# Patient Record
Sex: Male | Born: 1970 | Race: Black or African American | Hispanic: No
Health system: Midwestern US, Community
[De-identification: ages and names within clinical notes are randomized; demographics above are authoritative.]

## PROBLEM LIST (undated history)

## (undated) DIAGNOSIS — D34 Benign neoplasm of thyroid gland: Secondary | ICD-10-CM

## (undated) DIAGNOSIS — E119 Type 2 diabetes mellitus without complications: Principal | ICD-10-CM

## (undated) DIAGNOSIS — M25561 Pain in right knee: Secondary | ICD-10-CM

## (undated) DIAGNOSIS — Z01818 Encounter for other preprocedural examination: Secondary | ICD-10-CM

## (undated) DIAGNOSIS — Z01812 Encounter for preprocedural laboratory examination: Secondary | ICD-10-CM

## (undated) DIAGNOSIS — I480 Paroxysmal atrial fibrillation: Principal | ICD-10-CM

## (undated) DIAGNOSIS — G4733 Obstructive sleep apnea (adult) (pediatric): Secondary | ICD-10-CM

## (undated) DIAGNOSIS — M25512 Pain in left shoulder: Secondary | ICD-10-CM

## (undated) DIAGNOSIS — Z96652 Presence of left artificial knee joint: Secondary | ICD-10-CM

## (undated) DIAGNOSIS — E041 Nontoxic single thyroid nodule: Secondary | ICD-10-CM

## (undated) DIAGNOSIS — M1712 Unilateral primary osteoarthritis, left knee: Secondary | ICD-10-CM

## (undated) DIAGNOSIS — Z9189 Other specified personal risk factors, not elsewhere classified: Secondary | ICD-10-CM

## (undated) DIAGNOSIS — Z202 Contact with and (suspected) exposure to infections with a predominantly sexual mode of transmission: Secondary | ICD-10-CM

## (undated) DIAGNOSIS — G8929 Other chronic pain: Secondary | ICD-10-CM

## (undated) DIAGNOSIS — G4730 Sleep apnea, unspecified: Secondary | ICD-10-CM

## (undated) DIAGNOSIS — D171 Benign lipomatous neoplasm of skin and subcutaneous tissue of trunk: Secondary | ICD-10-CM

## (undated) DIAGNOSIS — M25562 Pain in left knee: Secondary | ICD-10-CM

## (undated) DIAGNOSIS — R051 Acute cough: Secondary | ICD-10-CM

## (undated) MED FILL — ELIQUIS 5MG TABS: 5 MG | 30 days supply | Qty: 60 | Fill #0 | Status: AC

## (undated) MED FILL — OXYCODONE-ACETAMINOPHEN 5-325 TABS: 5-325 MG | 3 days supply | Qty: 28 | Fill #0 | Status: AC

## (undated) MED FILL — LOSARTAN POTASSIUM 50MG TABS: 50 MG | 30 days supply | Qty: 30 | Fill #0 | Status: AC

## (undated) MED FILL — ATORVASTATIN CALCIUM 40MG TABS: 40 MG | 30 days supply | Qty: 30 | Fill #0 | Status: AC

## (undated) MED FILL — METOPROLOL SUCCINATE ER 50MG TB24: 50 MG | 30 days supply | Qty: 60 | Fill #0 | Status: AC

## (undated) MED FILL — METFORMIN HCL 1000MG TABS: 1000 MG | 30 days supply | Qty: 60 | Fill #0 | Status: AC

---

## 2013-09-15 ENCOUNTER — Emergency Department: Payer: Self-pay | Admitting: Emergency Medicine

## 2014-10-20 IMAGING — CR RIGHT HAND - COMPLETE 3+ VIEW
1 series · 3 of 3 positions shown · non-contrast
Comparison: none

REASON FOR EXAM: dog bite
COMMENTS:   LMP: (Male)

[Series 1: pa · 0.17mm/px · 3 of 3 slices shown]
[im 1/3]
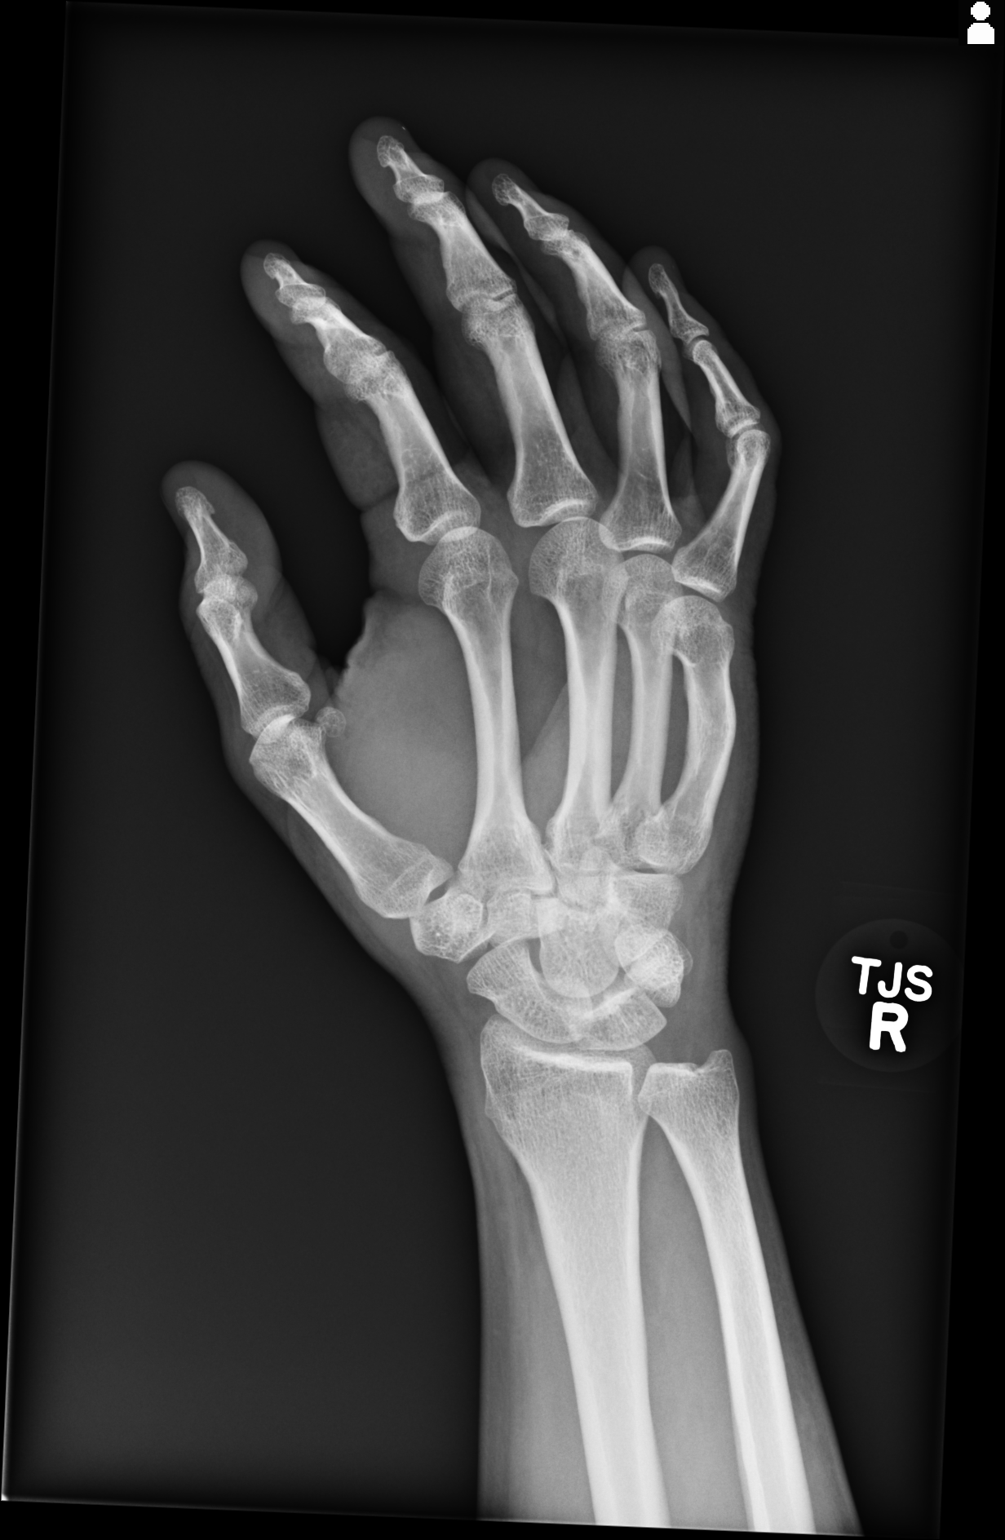
[im 2/3]
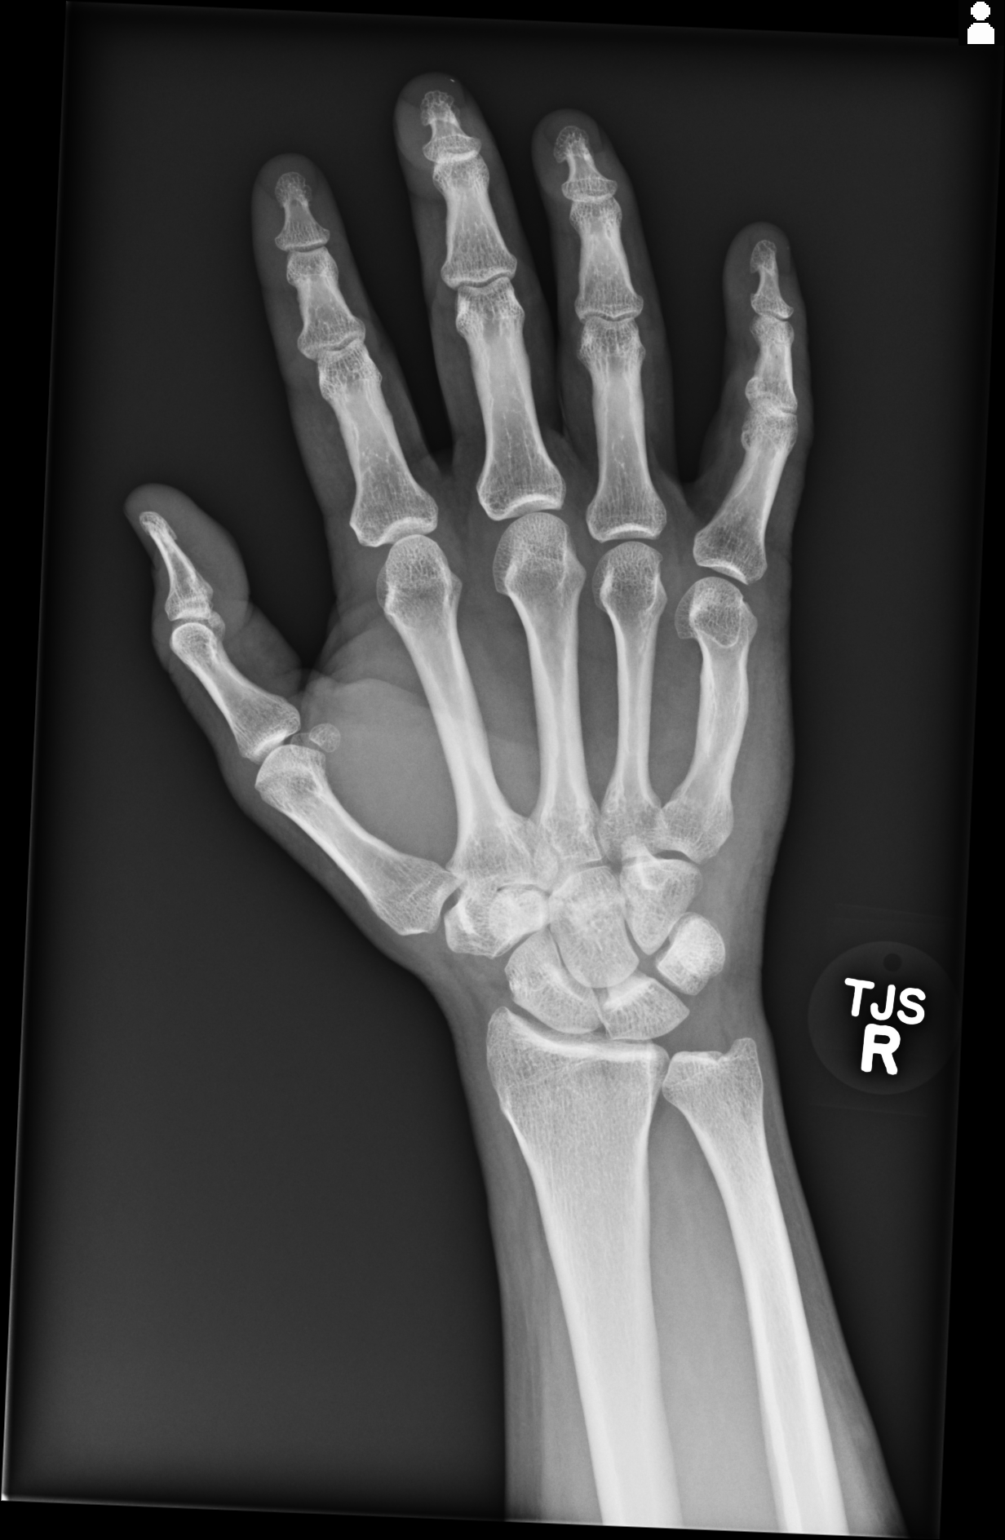
[im 3/3]
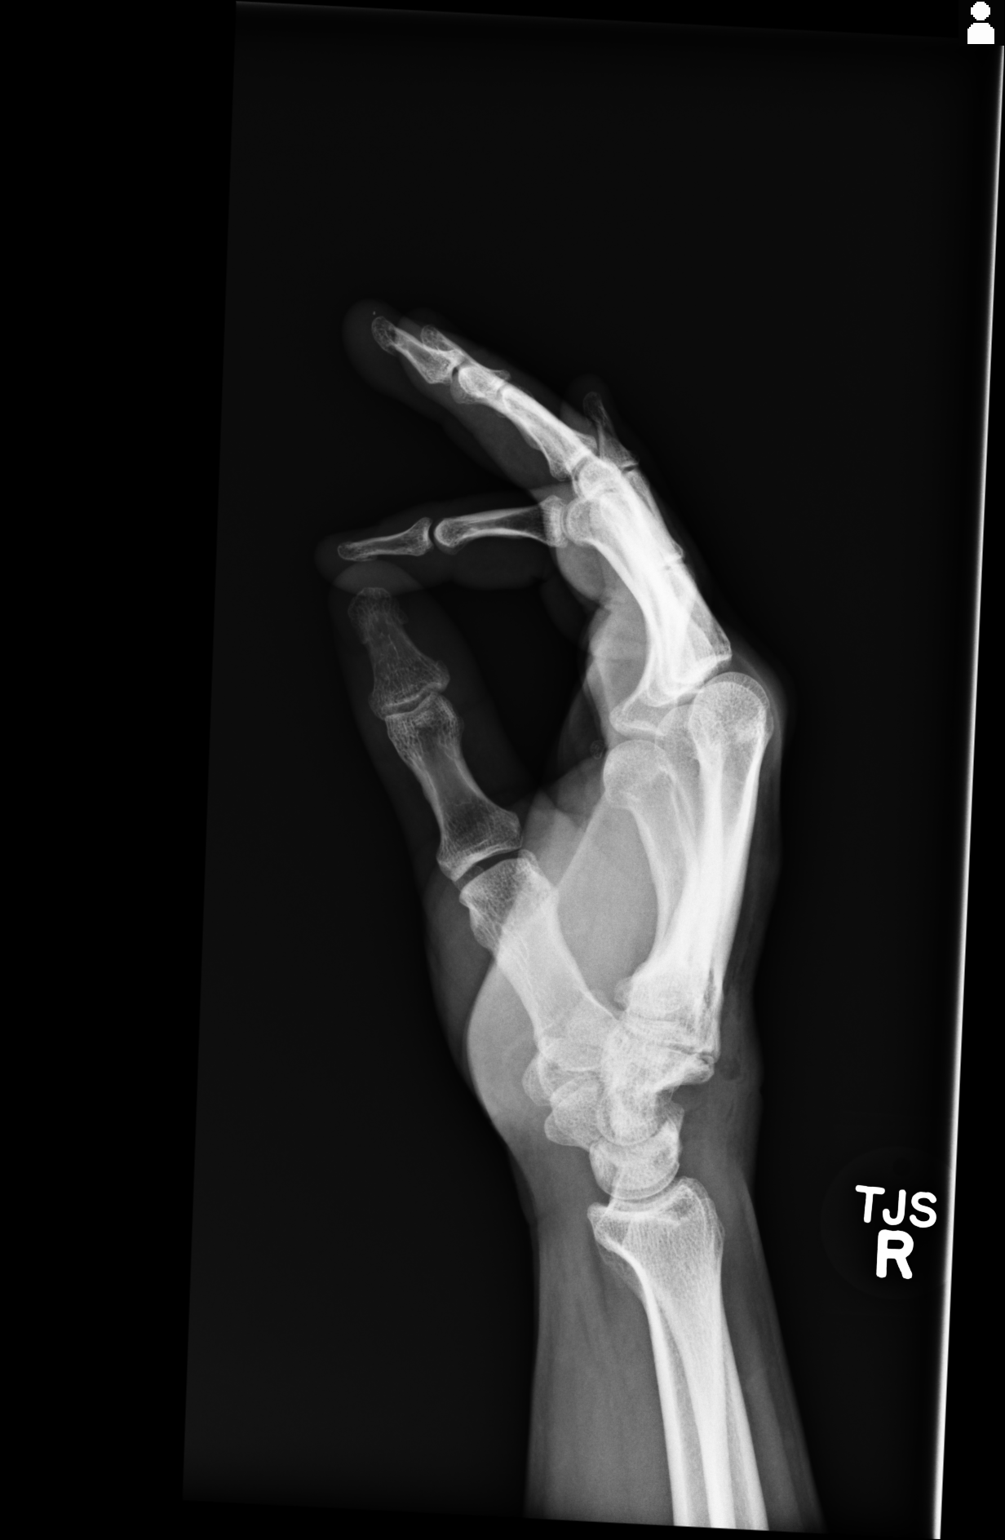

[3 of 3 positions shown; findings below may reference images not displayed]

PROCEDURE:     DXR - DXR HAND RT COMPLETE W/OBLIQUES  - September 15, 2013 [DATE]

RESULT:     Three views of the right hand are reviewed. The bones appear
adequately mineralized. An old fracture of the midshaft of the fifth
metacarpal is present. The interphalangeal joints and the
metacarpophalangeal joints appear normal. The overlying soft tissues exhibit
no foreign bodies or gas collections.
IMPRESSION: There is no acute bony abnormality of the right hand nor
definite soft tissue abnormality.

[REDACTED]

## 2016-05-22 ENCOUNTER — Encounter: Payer: Self-pay | Admitting: Family Medicine

## 2016-05-22 ENCOUNTER — Encounter (INDEPENDENT_AMBULATORY_CARE_PROVIDER_SITE_OTHER): Payer: Self-pay

## 2016-05-22 ENCOUNTER — Ambulatory Visit (INDEPENDENT_AMBULATORY_CARE_PROVIDER_SITE_OTHER): Payer: Self-pay | Admitting: Family Medicine

## 2016-05-22 VITALS — BP 126/80 | HR 58 | Temp 98.2°F | Resp 14 | Ht 74.0 in | Wt 224.0 lb

## 2016-05-22 DIAGNOSIS — Z7189 Other specified counseling: Secondary | ICD-10-CM

## 2016-05-22 DIAGNOSIS — Z7689 Persons encountering health services in other specified circumstances: Secondary | ICD-10-CM

## 2016-05-22 DIAGNOSIS — Z13220 Encounter for screening for lipoid disorders: Secondary | ICD-10-CM

## 2016-05-22 DIAGNOSIS — G629 Polyneuropathy, unspecified: Secondary | ICD-10-CM

## 2016-05-22 NOTE — Progress Notes (Signed)
Subjective:    Patient ID: Aaron Williams, male    DOB: July 26, 1971, 45 y.o.   MRN: [id number]  HPI Patient is a 45 year old white male here today to establish care. He is my Education officer, environmental.  He is a married father of 2. He engages in regular aerobic exercise. In fact he recently trained and ran a 10K. However over the last 2-3 months, he is developed numbness in his feet and hands. The numbness on examination seems to be distal to the MTP joints on all of his toes but is more pronounced on his big toe. This is symmetric bilaterally. He has decreased temperature sensation, decreased 5. discrimination, and decreased vibratory sensation on his toes but is not present on the dorsums of his feet or on his shins. He also reports "itching" in both hands. He states that it is deep. It is not actually an itch but we're pins and needles sensation. He finds himself scratching his hands for no reason even though there is no rash. 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 He denies any tremors. He denies any hand weakness or muscle weakness. He denies any other neurologic deficits, blurry vision, double vision. No past medical history on file. No past surgical history on file. No current outpatient prescriptions on file prior to visit.   No current facility-administered medications on file prior to visit.   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 Denies any alcohol tobacco or illicit drugs. Family History  Problem Relation Age of Onset  . Diabetes Mother   . Heart disease Father   . Parkinson's disease Maternal Grandmother   . Heart disease Paternal Grandfather       Review of Systems  All other systems reviewed and are negative.      Objective:   Physical Exam  Constitutional: He is oriented to person, place, and time. He appears well-developed and well-nourished. No distress.  HENT:  Head: Normocephalic and atraumatic.    Right Ear: External ear normal.  Left Ear: External ear normal.  Nose: Nose normal.  Mouth/Throat: Oropharynx is clear and moist. No oropharyngeal exudate.  Eyes: Conjunctivae and EOM are normal. Pupils are equal, round, and reactive to light. Right eye exhibits no discharge. Left eye exhibits no discharge. No scleral icterus.  Neck: Normal range of motion. Neck supple. No JVD present. No tracheal deviation present. No thyromegaly present.  Cardiovascular: Normal rate, regular rhythm, normal heart sounds and intact distal pulses.  Exam reveals no gallop and no friction rub.   No murmur heard. Pulmonary/Chest: Effort normal and breath sounds normal. No stridor. No respiratory distress. He has no wheezes. He has no rales.  Abdominal: Soft. Bowel sounds are normal. He exhibits no distension. There is no tenderness. There is no rebound and no guarding.  Musculoskeletal: He exhibits no edema or tenderness.       Right foot: There is normal range of motion, no tenderness, no bony tenderness, no swelling and normal capillary refill.       Left foot: There is normal range of motion, no tenderness, no bony tenderness, no swelling and normal capillary refill.  Lymphadenopathy:    He has no cervical adenopathy.  Neurological: He is alert and oriented to person, place, and time. He has normal strength and normal reflexes. He displays no atrophy, no tremor and normal reflexes. A sensory deficit is present. No cranial nerve deficit. He exhibits normal muscle tone. He  displays a negative Romberg sign. Coordination and gait normal.  Skin: Skin is warm and dry. No rash noted. He is not diaphoretic. No erythema.  Psychiatric: He has a normal mood and affect. His behavior is normal. Judgment and thought content normal.  Vitals reviewed.         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 Examination is not pronounced today but his symptoms are consistent with this. I will begin the workup by checking TSH, vitamin B12, fasting blood sugar, CBC, CMP. Given his family history of heart disease, also check a screening cholesterol level. I believe that the neuropathy in his feet is likely due to the recent running he has been doing an ill fitting shoes with lack of padding causing a compressive neuropathy on the interdigital nerves in his toes. I see no evidence of Morton's neuroma. I am not sure of the cause of his hands. He may even have some mild carpal tunnel although Tinel sign and Phalen sign are negative today. Consider nerve conduction test if worsening.

## 2016-05-23 ENCOUNTER — Other Ambulatory Visit: Payer: Self-pay

## 2016-05-23 LAB — CBC WITH DIFFERENTIAL/PLATELET
Basophils Absolute: 104 cells/uL (ref 0–200)
Basophils Relative: 2 %
EOS PCT: 3 %
Eosinophils Absolute: 156 cells/uL (ref 15–500)
HCT: 43.4 % (ref 38.5–50.0)
HEMOGLOBIN: 15.4 g/dL (ref 13.0–17.0)
LYMPHS ABS: 2028 {cells}/uL (ref 850–3900)
Lymphocytes Relative: 39 %
MCH: 27.5 pg (ref 27.0–33.0)
MCHC: 35.5 g/dL (ref 32.0–36.0)
MCV: 77.4 fL — ABNORMAL LOW (ref 80.0–100.0)
MONOS PCT: 7 %
MPV: 10.2 fL (ref 7.5–12.5)
Monocytes Absolute: 364 cells/uL (ref 200–950)
NEUTROS ABS: 2548 {cells}/uL (ref 1500–7800)
Neutrophils Relative %: 49 %
PLATELETS: 171 10*3/uL (ref 140–400)
RBC: 5.61 MIL/uL (ref 4.20–5.80)
RDW: 14.3 % (ref 11.0–15.0)
WBC: 5.2 10*3/uL (ref 3.8–10.8)

## 2016-05-23 LAB — COMPLETE METABOLIC PANEL WITH GFR
ALBUMIN: 4.4 g/dL (ref 3.6–5.1)
ALK PHOS: 44 U/L (ref 40–115)
ALT: 18 U/L (ref 9–46)
AST: 19 U/L (ref 10–40)
BUN: 15 mg/dL (ref 7–25)
CO2: 23 mmol/L (ref 20–31)
Calcium: 9.1 mg/dL (ref 8.6–10.3)
Chloride: 104 mmol/L (ref 98–110)
Creat: 1.08 mg/dL (ref 0.60–1.35)
GFR, EST NON AFRICAN AMERICAN: 84 mL/min (ref >=60)
GFR, Est African American: >89 mL/min (ref >=60)
Glucose, Bld: 101 mg/dL — ABNORMAL HIGH (ref 70–99)
POTASSIUM: 4.5 mmol/L (ref 3.5–5.3)
SODIUM: 140 mmol/L (ref 135–146)
TOTAL PROTEIN: 6.8 g/dL (ref 6.1–8.1)
Total Bilirubin: 0.8 mg/dL (ref 0.2–1.2)

## 2016-05-23 LAB — LIPID PANEL
CHOL/HDL RATIO: 4.9 ratio (ref <=5.0)
Cholesterol: 194 mg/dL (ref 125–200)
HDL: 40 mg/dL (ref >=40)
LDL CALC: 118 mg/dL (ref <130)
Triglycerides: 180 mg/dL — ABNORMAL HIGH (ref <150)
VLDL: 36 mg/dL — ABNORMAL HIGH (ref <30)

## 2016-05-23 LAB — TSH: TSH: 1.01 m[IU]/L (ref 0.40–4.50)

## 2016-05-23 LAB — VITAMIN B12: VITAMIN B 12: 475 pg/mL (ref 200–1100)

## 2016-05-24 ENCOUNTER — Encounter: Payer: Self-pay | Admitting: Family Medicine

## 2017-10-29 ENCOUNTER — Encounter: Payer: Self-pay | Admitting: Family Medicine

## 2017-10-29 ENCOUNTER — Ambulatory Visit (INDEPENDENT_AMBULATORY_CARE_PROVIDER_SITE_OTHER): Payer: Self-pay | Admitting: Family Medicine

## 2017-10-29 VITALS — BP 130/80 | HR 63 | Temp 98.2°F | Resp 12 | Ht 74.0 in | Wt 224.0 lb

## 2017-10-29 DIAGNOSIS — G629 Polyneuropathy, unspecified: Secondary | ICD-10-CM

## 2017-10-29 DIAGNOSIS — F985 Adult onset fluency disorder: Secondary | ICD-10-CM

## 2017-10-29 DIAGNOSIS — F419 Anxiety disorder, unspecified: Secondary | ICD-10-CM

## 2017-10-29 MED ORDER — PROPRANOLOL HCL 10 MG PO TABS: 10.0000 mg | ORAL_TABLET | Freq: Three times a day (TID) | ORAL | 0 refills | Status: DC

## 2017-10-29 NOTE — Progress Notes (Signed)
Subjective:    Patient ID: Aaron Williams, male    DOB: 1971-10-08, 46 y.o.   MRN: [id number]  HPI  05/22/16 Patient is a 46 year old white male here today to establish care. He is my Education officer, environmentalpastor.  He is a married father of 2. He engages in regular aerobic exercise. In fact he recently trained and ran a 10K. However over the last 2-3 months, he is developed numbness in his feet and hands. The numbness on examination seems to be distal to the MTP joints on all of his toes but is more pronounced on his big toe. This is symmetric bilaterally. He has decreased temperature sensation, decreased 5. discrimination, and decreased vibratory sensation on his toes but is not present on the dorsums of his feet or on his shins. He also reports "itching" in both hands. He states that it is deep. It is not actually an itch but we're pins and needles sensation. He finds himself scratching his hands for no reason even though there is no rash. 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 He denies any tremors. He denies any hand weakness or muscle weakness. He denies any other neurologic deficits, blurry vision, double vision.  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 Examination is not pronounced today but his symptoms are consistent with this. I will begin the workup by checking TSH, vitamin B12, fasting blood sugar, CBC, CMP. Given his family history of heart disease, also check a screening cholesterol level. I believe that the neuropathy in his feet is likely due to the recent running he has been doing an ill fitting shoes with lack of padding causing a compressive neuropathy on the interdigital nerves in his toes. I see no evidence of Morton's neuroma. I am not sure of the cause of his hands. He may even have some mild carpal tunnel although Tinel sign and Phalen sign are negative today. Consider nerve  conduction test if worsening.  10/29/17 Patient is here today accompanied by his wife.  Patient works as a Education officer, environmentalpastor.  Over the last month, he has developed stuttering.  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  He states that he will be unable to say a word.  He knows the word in his brain but he cannot make his mouth say the word.  It is not as much of a stuttering is a temporary a aphasia.  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  It is made worse by anxiety or stress.  It does not occur in relaxed situations or in a relaxed conversation and he denies any head trauma.  He denies any severe headaches.  He denies any tremors.  He denies any muscle weakness.  He denies any diplopia.  He denies any trouble swallowing or slurred speech.  Does have a family history of Parkinson's disease in his grandmother.  He continues to experience neuropathy in his feet and occasionally in his hands.  However the symptoms have not worsened over the last year  No past medical history on file. No past surgical history on file. No current outpatient prescriptions on file prior to visit.   No current facility-administered medications on file prior to visit.    No Known Allergies Social history, married father of 2 works as a Optician, dispensingminister. Denies any alcohol tobacco or illicit drugs. Family History  Problem Relation Age of Onset  . Diabetes Mother   .  Heart disease Father   . Parkinson's disease Maternal Grandmother   . Heart disease Paternal Grandfather       Review of Systems  All other systems reviewed and are negative.      Objective:   Physical Exam  Constitutional: He is oriented to person, place, and time. He appears well-developed and well-nourished. No distress.  HENT:  Head: Normocephalic and atraumatic.  Right Ear: External ear normal.  Left Ear: External ear normal.  Nose: Nose normal.  Mouth/Throat: Oropharynx is clear and moist. No oropharyngeal exudate.  Eyes: Pupils are equal, round, and reactive to  light. Conjunctivae and EOM are normal. Right eye exhibits no discharge. Left eye exhibits no discharge. No scleral icterus.  Neck: Normal range of motion. Neck supple. No JVD present. No tracheal deviation present. No thyromegaly present.  Cardiovascular: Normal rate, regular rhythm, normal heart sounds and intact distal pulses.  Exam reveals no gallop and no friction rub.   No murmur heard. Pulmonary/Chest: Effort normal and breath sounds normal. No stridor. No respiratory distress. He has no wheezes. He has no rales.  Abdominal: Soft. Bowel sounds are normal. He exhibits no distension. There is no tenderness. There is no rebound and no guarding.  Musculoskeletal: He exhibits no edema or tenderness.       Right foot: There is normal range of motion, no tenderness, no bony tenderness, no swelling and normal capillary refill.       Left foot: There is normal range of motion, no tenderness, no bony tenderness, no swelling and normal capillary refill.  Lymphadenopathy:    He has no cervical adenopathy.  Neurological: He is alert and oriented to person, place, and time. He has normal strength and normal reflexes. He displays no atrophy and no tremor. A sensory deficit is present. No cranial nerve deficit. He exhibits normal muscle tone. He displays a negative Romberg sign. Coordination and gait normal.  Skin: Skin is warm and dry. No rash noted. He is not diaphoretic. No erythema.  Psychiatric: He has a normal mood and affect. His behavior is normal. Judgment and thought content normal.  Vitals reviewed.         Assessment & Plan:  Adult onset stuttering - Plan: MR Brain W Wo Contrast  Neuropathy - Plan: MR Brain W Wo Contrast  Neurologic exam today is completely normal.  Differential diagnosis includes anxiety induced conversion disorder, panic attack, atypical MS, parkinsonian-like syndrome.  I believe most likely this is anxiety related given the situation induced nature of the disorder.    I would recommend putting the patient on propranolol 10 mg 30 minutes prior to sermons to work as a "anti-adrenaline" to see if this can help blunt some of the anxiety without sedating the patient.  If not beneficial, we can even try a low-dose Xanax prior to sermons or public speaking such as 0.25 mg.  I do believe that once we control the stuttering, the patient self-confidence will improve, and the stuttering will subside.

## 2018-01-31 ENCOUNTER — Telehealth: Payer: Self-pay | Admitting: Family Medicine

## 2018-01-31 MED ORDER — PROPRANOLOL HCL 20 MG PO TABS: 20.0000 mg | ORAL_TABLET | Freq: Every day | ORAL | 2 refills | Status: DC | PRN

## 2018-01-31 NOTE — Telephone Encounter (Signed)
Spoke to pt and he states that he only takes when speaking and the 20mg  seems to work better for him. He states that he maybe takes it once or twice a week depending on if he thinks he needs it or not. Rx sent to pharm for 20mg .

## 2018-03-03 ENCOUNTER — Encounter: Payer: Self-pay | Admitting: Family Medicine

## 2018-03-03 ENCOUNTER — Ambulatory Visit: Payer: Self-pay | Admitting: Family Medicine

## 2018-03-03 VITALS — BP 118/74 | HR 66 | Temp 98.0°F | Resp 14 | Ht 74.0 in | Wt 220.0 lb

## 2018-03-03 DIAGNOSIS — F411 Generalized anxiety disorder: Secondary | ICD-10-CM

## 2018-03-03 DIAGNOSIS — F985 Adult onset fluency disorder: Secondary | ICD-10-CM

## 2018-03-03 DIAGNOSIS — F321 Major depressive disorder, single episode, moderate: Secondary | ICD-10-CM

## 2018-03-03 DIAGNOSIS — F419 Anxiety disorder, unspecified: Secondary | ICD-10-CM

## 2018-03-03 MED ORDER — ALPRAZOLAM 0.5 MG PO TABS: 0.5000 mg | ORAL_TABLET | Freq: Three times a day (TID) | ORAL | 0 refills | Status: DC | PRN

## 2018-03-03 MED ORDER — ESCITALOPRAM OXALATE 10 MG PO TABS: 10.0000 mg | ORAL_TABLET | Freq: Every day | ORAL | 1 refills | Status: DC

## 2018-03-03 NOTE — Progress Notes (Signed)
Subjective:    Patient ID: ORYN CASANOVA, male    DOB: [birth year], 47 y.o.   MRN: [id number]  HPI  05/22/16 Patient is a 47 year old white male here today to establish care. He is my Education officer, environmental.  He is a married father of 2. He engages in regular aerobic exercise. In fact he recently trained and ran a 10K. However over the last 2-3 months, he is developed numbness in his feet and hands. The numbness on examination seems to be distal to the MTP joints on all of his toes but is more pronounced on his big toe. This is symmetric bilaterally. He has decreased temperature sensation, decreased 5. discrimination, and decreased vibratory sensation on his toes but is not present on the dorsums of his feet or on his shins. He also reports "itching" in both hands. He states that it is deep. It is not actually an itch but we're pins and needles sensation. He finds himself scratching his hands for no reason even though there is no rash. 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 He denies any tremors. He denies any hand weakness or muscle weakness. He denies any other neurologic deficits, blurry vision, double vision.  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 Examination is not pronounced today but his symptoms are consistent with this. I will begin the workup by checking TSH, vitamin B12, fasting blood sugar, CBC, CMP. Given his family history of heart disease, also check a screening cholesterol level. I believe that the neuropathy in his feet is likely due to the recent running he has been doing an ill fitting shoes with lack of padding causing a compressive neuropathy on the interdigital nerves in his toes. I see no evidence of Morton's neuroma. I am not sure of the cause of his hands. He may even have some mild carpal tunnel although Tinel sign and Phalen sign are negative today. Consider nerve  conduction test if worsening.  10/29/17 Patient is here today accompanied by his wife.  Patient works as a Education officer, environmental.  Over the last month, he has developed stuttering.  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  He states that he will be unable to say a word.  He knows the word in his brain but he cannot make his mouth say the word.  It is not as much of a stuttering is a temporary a aphasia.  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  It is made worse by anxiety or stress.  It does not occur in relaxed situations or in a relaxed conversation and he denies any head trauma.  He denies any severe headaches.  He denies any tremors.  He denies any muscle weakness.  He denies any diplopia.  He denies any trouble swallowing or slurred speech.  Does have a family history of Parkinson's disease in his grandmother.  He continues to experience neuropathy in his feet and occasionally in his hands.  However the symptoms have not worsened over the last year.  AT that time, my plan was: Neurologic exam today is completely normal.  Differential diagnosis includes anxiety induced conversion disorder, panic attack, atypical MS, parkinsonian-like syndrome.  I believe most likely this is anxiety related given the situation induced nature of the disorder.   I would recommend putting the patient on propranolol 10 mg 30 minutes prior to sermons to work as a "anti-adrenaline" to see if  this can help blunt some of the anxiety without sedating the patient.  If not beneficial, we can even try a low-dose Xanax prior to sermons or public speaking such as 0.25 mg.  I do believe that once we control the stuttering, the patient self-confidence will improve, and the stuttering will subside.  03/03/18 Patient is here to follow-up.  Symptoms have worsened.  He reports anxiety on a daily basis.  He reports little interest or pleasure doing things on several days.  He reports feeling down depressed or hopeless more than half days.  He reports trouble falling  asleep, poor appetite.  He reports feeling bad about himself nearly every day or feeling that he is let his family down nearly every day.  He reports several days having trouble concentrating.  He also has had several days where he had thoughts that he would be better off dead but had no desire to commit suicide or plan to commit suicide.  Essentially he is in constant anxiety.  He is unable to work.  He fears speaking due to the stuttering.  He is having panic attacks while speaking.  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  Thankfully he denies any other neurologic deficits.  This is only present when he speaks publicly.  He denies any problems with coordination or unilateral weakness or numbness or muscle weakness.  No past medical history on file.  Current Outpatient Medications on File Prior to Visit  Medication Sig Dispense Refill  . propranolol (INDERAL) 20 MG tablet Take 1 tablet (20 mg total) by mouth daily as needed. 30 tablet 2   No current facility-administered medications on file prior to visit.    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 Denies any alcohol tobacco or illicit drugs. Family History  Problem Relation Age of Onset  . Diabetes Mother   . Heart disease Father   . Parkinson's disease Maternal Grandmother   . Heart disease Paternal Grandfather       Review of Systems  All other systems reviewed and are negative.      Objective:   Physical Exam  Constitutional: He is oriented to person, place, and time. He appears well-developed and well-nourished. No distress.  HENT:  Head: Normocephalic and atraumatic.  Right Ear: External ear normal.  Left Ear: External ear normal.  Nose: Nose normal.  Mouth/Throat: Oropharynx is clear and moist. No oropharyngeal exudate.  Eyes: Conjunctivae and EOM  are normal. Pupils are equal, round, and reactive to light. Right eye exhibits no discharge. Left eye exhibits no discharge. No scleral icterus.  Neck: Normal range of motion. Neck supple. No JVD present. No tracheal deviation present. No thyromegaly present.  Cardiovascular: Normal rate, regular rhythm, normal heart sounds and intact distal pulses. Exam reveals no gallop and no friction rub.  No murmur heard. Pulmonary/Chest: Effort normal and breath sounds normal. No stridor. No respiratory distress. He has no wheezes. He has no rales.  Abdominal: Soft. Bowel sounds are normal. He exhibits no distension. There is no tenderness. There is no rebound and no guarding.  Musculoskeletal: He exhibits no edema or tenderness.       Right foot: There is normal range of motion, no tenderness, no bony tenderness, no swelling and normal capillary refill.  Left foot: There is normal range of motion, no tenderness, no bony tenderness, no swelling and normal capillary refill.  Lymphadenopathy:    He has no cervical adenopathy.  Neurological: He is alert and oriented to person, place, and time. He has normal strength and normal reflexes. He displays no atrophy and no tremor. A sensory deficit is present. No cranial nerve deficit. He exhibits normal muscle tone. He displays a negative Romberg sign. Coordination and gait normal.  Skin: Skin is warm and dry. No rash noted. He is not diaphoretic. No erythema.  Psychiatric: He has a normal mood and affect. His behavior is normal. Judgment and thought content normal.  Vitals reviewed.         Assessment & Plan:  Adult onset stuttering  Anxiety  GAD (generalized anxiety disorder)  Depression, major, single episode, moderate (HCC)  I spent more than 25 minutes today with the patient.  I believe he is suffering from anxiety disorder.  He is now afraid to speak in public.  This is causing him tremendous difficulty as his career involves him speaking in  public.  This is leading to depression.  Begin Lexapro 10 mg p.o. daily.  He can use Xanax 0.5 mg p.o. every 8 hours as needed anxiety until the medication takes full effect.  Consult psychology for cognitive behavioral therapy.  Reassess the patient in 3-4 weeks or sooner if worse

## 2018-03-31 ENCOUNTER — Encounter: Payer: Self-pay | Admitting: Family Medicine

## 2018-03-31 ENCOUNTER — Ambulatory Visit: Payer: Self-pay | Admitting: Family Medicine

## 2018-03-31 VITALS — BP 126/80 | HR 56 | Temp 97.9°F | Resp 12 | Ht 74.0 in | Wt 221.0 lb

## 2018-03-31 DIAGNOSIS — F321 Major depressive disorder, single episode, moderate: Secondary | ICD-10-CM

## 2018-03-31 DIAGNOSIS — F419 Anxiety disorder, unspecified: Secondary | ICD-10-CM

## 2018-03-31 MED ORDER — CITALOPRAM HYDROBROMIDE 20 MG PO TABS: 20.0000 mg | ORAL_TABLET | Freq: Every day | ORAL | 4 refills | Status: DC

## 2018-03-31 NOTE — Progress Notes (Signed)
Subjective:    Patient ID: Aaron Williams, male    DOB: [birth year], 47 y.o.   MRN: [id number]  Anxiety       05/22/16 Patient is a 47 year old white male here today to establish care. He is my Education officer, environmentalpastor.  He is a married father of 2. He engages in regular aerobic exercise. In fact he recently trained and ran a 10K. However over the last 2-3 months, he is developed numbness in his feet and hands. The numbness on examination seems to be distal to the MTP joints on all of his toes but is more pronounced on his big toe. This is symmetric bilaterally. He has decreased temperature sensation, decreased 5. discrimination, and decreased vibratory sensation on his toes but is not present on the dorsums of his feet or on his shins. He also reports "itching" in both hands. He states that it is deep. It is not actually an itch but we're pins and needles sensation. He finds himself scratching his hands for no reason even though there is no rash. 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 He denies any tremors. He denies any hand weakness or muscle weakness. He denies any other neurologic deficits, blurry vision, double vision.  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 Examination is not pronounced today but his symptoms are consistent with this. I will begin the workup by checking TSH, vitamin B12, fasting blood sugar, CBC, CMP. Given his family history of heart disease, also check a screening cholesterol level. I believe that the neuropathy in his feet is likely due to the recent running he has been doing an ill fitting shoes with lack of padding causing a compressive neuropathy on the interdigital nerves in his toes. I see no evidence of Morton's neuroma. I am not sure of the cause of his hands. He may even have some mild carpal tunnel although Tinel sign and Phalen sign are negative today.  Consider nerve conduction test if worsening.  10/29/17 Patient is here today accompanied by his wife.  Patient works as a Education officer, environmentalpastor.  Over the last month, he has developed stuttering.  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  He states that he will be unable to say a word.  He knows the word in his brain but he cannot make his mouth say the word.  It is not as much of a stuttering is a temporary a aphasia.  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  It is made worse by anxiety or stress.  It does not occur in relaxed situations or in a relaxed conversation and he denies any head trauma.  He denies any severe headaches.  He denies any tremors.  He denies any muscle weakness.  He denies any diplopia.  He denies any trouble swallowing or slurred speech.  Does have a family history of Parkinson's disease in his grandmother.  He continues to experience neuropathy in his feet and occasionally in his hands.  However the symptoms have not worsened over the last year.  AT that time, my plan was: Neurologic exam today is completely normal.  Differential diagnosis includes anxiety induced conversion disorder, panic attack, atypical MS, parkinsonian-like syndrome.  I believe most likely this is anxiety related given the situation induced nature of the disorder.   I would recommend putting the patient on propranolol 10 mg 30 minutes prior to sermons to work as  a "anti-adrenaline" to see if this can help blunt some of the anxiety without sedating the patient.  If not beneficial, we can even try a low-dose Xanax prior to sermons or public speaking such as 0.25 mg.  I do believe that once we control the stuttering, the patient self-confidence will improve, and the stuttering will subside.  03/03/18 Patient is here to follow-up.  Symptoms have worsened.  He reports anxiety on a daily basis.  He reports little interest or pleasure doing things on several days.  He reports feeling down depressed or hopeless more than half days.  He reports  trouble falling asleep, poor appetite.  He reports feeling bad about himself nearly every day or feeling that he is let his family down nearly every day.  He reports several days having trouble concentrating.  He also has had several days where he had thoughts that he would be better off dead but had no desire to commit suicide or plan to commit suicide.  Essentially he is in constant anxiety.  He is unable to work.  He fears speaking due to the stuttering.  He is having panic attacks while speaking.  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  Thankfully he denies any other neurologic deficits.  This is only present when he speaks publicly.  He denies any problems with coordination or unilateral weakness or numbness or muscle weakness.  At that time, my plan was: I spent more than 25 minutes today with the patient.  I believe he is suffering from anxiety disorder.  He is now afraid to speak in public.  This is causing him tremendous difficulty as his career involves him speaking in public.  This is leading to depression.  Begin Lexapro 10 mg p.o. daily.  He can use Xanax 0.5 mg p.o. every 8 hours as needed anxiety until the medication takes full effect.  Consult psychology for cognitive behavioral therapy.  Reassess the patient in 3-4 weeks or sooner if worse  03/31/18 Patient states that he has been doing better over the last week.  He states that the medication seems to be taking effect now.  The last 7 days has been the first period of time where his mind has not been preoccupied with constant feelings of anxiety and dread regarding his inability to speak publicly.  He is slowly trying to wean back into his position at his church.  He is speaking in small group.  He is trying to speak on Wednesday nights where there are smaller crowds and trying to ease himself back in.  He  denies any panic attacks.  He denies any suicidal thoughts.  He certainly feels his depression has lifted.  However the medication is expensive based on his insurance and he is interested in switching to something cheaper.  He is using Xanax sparingly  No past medical history on file.  Current Outpatient Medications on File Prior to Visit  Medication Sig Dispense Refill  . ALPRAZolam (XANAX) 0.5 MG tablet Take 1 tablet (0.5 mg total) by mouth 3 (three) times daily as needed for anxiety. 30 tablet 0  . escitalopram (LEXAPRO) 10 MG tablet Take 1 tablet (10 mg total) by mouth daily. 30 tablet 1   No current facility-administered medications on file prior to visit.    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 Denies any alcohol tobacco or illicit drugs. Family History  Problem Relation Age of Onset  . Diabetes Mother   . Heart disease Father   . Parkinson's disease Maternal Grandmother   . Heart disease Paternal Grandfather       Review of Systems  All other systems reviewed and are negative.      Objective:   Physical Exam  Constitutional: He is oriented to person, place, and time. He appears well-developed and well-nourished. No distress.  Cardiovascular: Normal rate, regular rhythm and normal heart sounds.  Pulmonary/Chest: Breath sounds normal. No respiratory distress. He has no wheezes. He has no rales.  Neurological: He is alert and oriented to person, place, and time. He has normal strength. No cranial nerve deficit or sensory deficit. He displays a negative Romberg sign. Coordination normal.  Skin: He is not diaphoretic.  Psychiatric: He has a normal mood and affect. His behavior is normal. Judgment and thought content normal.  Vitals reviewed.         Assessment & Plan:  Anxiety  Depression, major, single episode, moderate (HCC)  I am happy to report that the patient's depression seems to be improving.  Discontinue Lexapro and  replaced with citalopram 20 mg a day for cost saving reasons.  Patient is now meeting with his counselor.  He will follow back up with a counselor in 2 weeks and I wait to hear back from him  In 4 weeks.  I have recommended that he gradually start increasing his speaking responsibilities within the church in an effort to try to gain self-confidence and ultimately return into full time speaking.

## 2018-05-05 ENCOUNTER — Telehealth: Payer: Self-pay | Admitting: Family Medicine

## 2018-05-05 MED ORDER — ESCITALOPRAM OXALATE 10 MG PO TABS: 10.0000 mg | ORAL_TABLET | Freq: Every day | ORAL | 1 refills | Status: DC

## 2018-05-05 NOTE — Telephone Encounter (Signed)
Pt called [facility] stating that he believed that he was doing better on Lexapro and would like to know if it is ok for him to go back on that instead of the Celexa????

## 2018-05-05 NOTE — Telephone Encounter (Signed)
Medication called/sent to requested pharmacy and pt aware via vm 

## 2018-05-05 NOTE — Telephone Encounter (Signed)
Yes, ok to resume lexapro 10 mg a day.

## 2018-07-11 ENCOUNTER — Other Ambulatory Visit: Payer: Self-pay | Admitting: Family Medicine

## 2018-07-11 MED ORDER — ALPRAZOLAM 0.5 MG PO TABS: 0.5000 mg | ORAL_TABLET | Freq: Three times a day (TID) | ORAL | 0 refills | Status: DC | PRN

## 2018-07-11 NOTE — Telephone Encounter (Signed)
Pt is requesting refill on Xanax   LOV: 03/31/18  LRF:   03/03/18

## 2018-10-16 ENCOUNTER — Other Ambulatory Visit: Payer: Self-pay | Admitting: Family Medicine

## 2018-10-16 NOTE — Telephone Encounter (Signed)
Pt is requesting refill on Xanax   LOV: 03/31/18  LRF:   07/11/18

## 2018-10-17 MED ORDER — ALPRAZOLAM 0.5 MG PO TABS: 0.5000 mg | ORAL_TABLET | Freq: Three times a day (TID) | ORAL | 0 refills | Status: DC | PRN

## 2018-10-30 ENCOUNTER — Other Ambulatory Visit: Payer: Self-pay | Admitting: Orthopedic Surgery

## 2018-10-30 DIAGNOSIS — G8929 Other chronic pain: Secondary | ICD-10-CM

## 2018-10-30 DIAGNOSIS — M2392 Unspecified internal derangement of left knee: Secondary | ICD-10-CM

## 2018-10-30 DIAGNOSIS — M25562 Pain in left knee: Secondary | ICD-10-CM

## 2018-10-30 DIAGNOSIS — M25362 Other instability, left knee: Secondary | ICD-10-CM

## 2018-11-05 ENCOUNTER — Ambulatory Visit
Admission: RE | Admit: 2018-11-05 | Discharge: 2018-11-05 | Disposition: A | Discharge status: Home / Self Care | Payer: No Typology Code available for payment source | Source: Ambulatory Visit | Attending: Orthopedic Surgery | Admitting: Orthopedic Surgery

## 2018-11-05 DIAGNOSIS — M2392 Unspecified internal derangement of left knee: Secondary | ICD-10-CM

## 2018-11-05 DIAGNOSIS — M25562 Pain in left knee: Secondary | ICD-10-CM

## 2018-11-05 DIAGNOSIS — M25362 Other instability, left knee: Secondary | ICD-10-CM

## 2018-11-05 DIAGNOSIS — G8929 Other chronic pain: Secondary | ICD-10-CM

## 2019-07-21 ENCOUNTER — Other Ambulatory Visit: Payer: Self-pay | Admitting: Family Medicine

## 2019-07-21 MED ORDER — ALPRAZOLAM 0.5 MG PO TABS: 0.5000 mg | ORAL_TABLET | Freq: Three times a day (TID) | ORAL | 0 refills | Status: DC | PRN

## 2019-07-21 NOTE — Telephone Encounter (Signed)
Pt is requesting refill on Xanax   LOV: 03/31/18  LRF:  10/17/18

## 2019-12-10 IMAGING — MR MR KNEE*L* W/O CM
5 of 7 series · 24 of 40 positions shown · non-contrast
Comparison: None.

CLINICAL DATA: Left knee pain and instability.

EXAM:
MRI OF THE LEFT KNEE WITHOUT CONTRAST
TECHNIQUE: Multiplanar, multisequence MR imaging of the knee was performed. No
intravenous contrast was administered.

[Series 3: T2 fat-sat · axial · 4.0mm · 0.50mm/px · z∈[-43,+107]mm · 8 of 31 slices shown (1 of 2)]
[im 1/31]
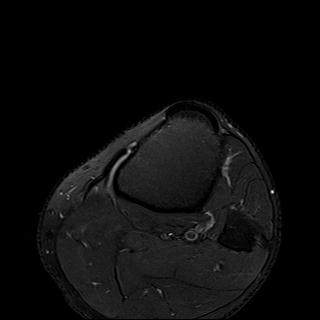
[im 5/31]
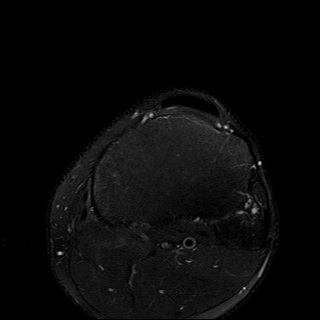
[im 9/31]
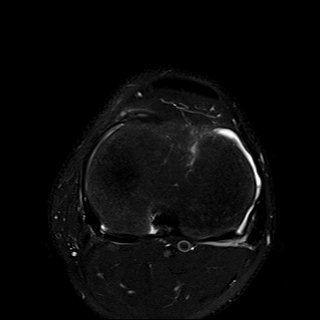
[im 13/31]
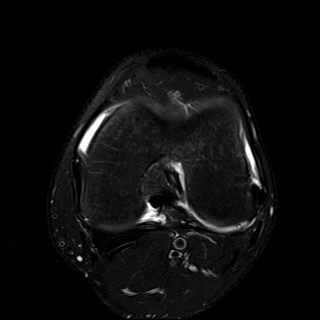
[im 18/31]
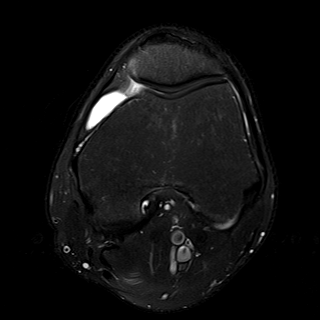
[im 22/31]
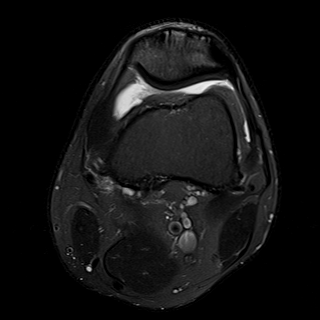
[im 26/31]
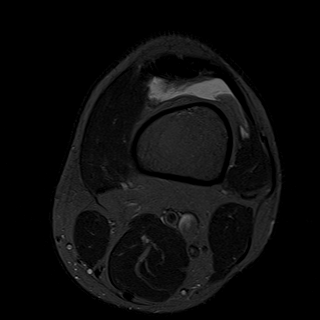
[im 31/31]
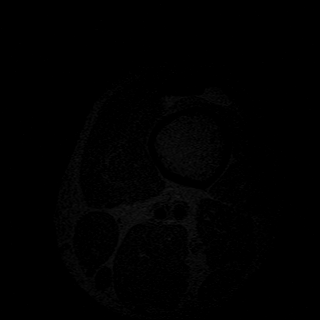

[Series 5: T2 fat-sat · coronal · 4.0mm · 0.29mm/px · 2 of 27 slices shown (2 of 2)]
[im 1/27]
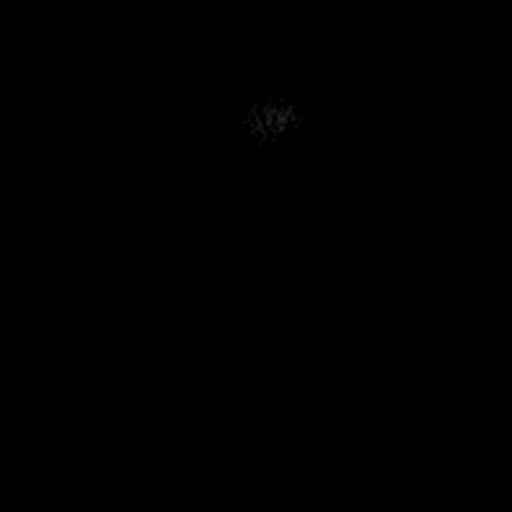
[im 6/27]
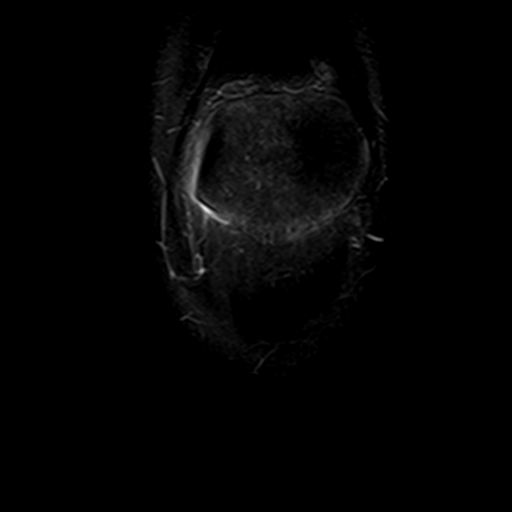

[Series 7: PD fat-sat · sagittal · 3.0mm · 0.31mm/px · 6 of 28 slices shown (1 of 3)]
[im 1/28]
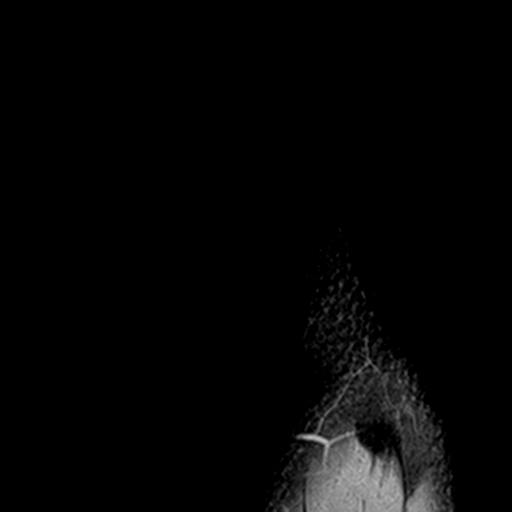
[im 6/28]
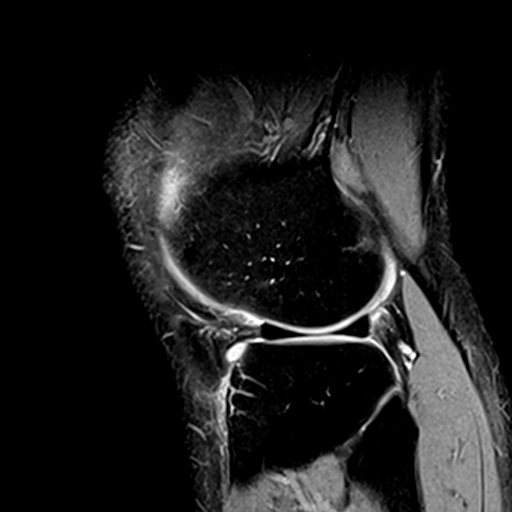
[im 11/28]
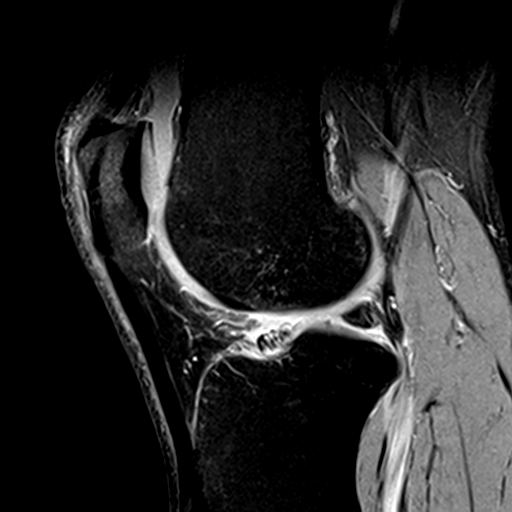
[im 17/28]
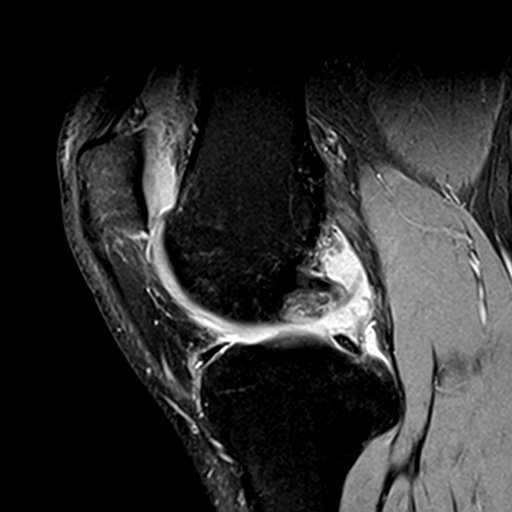
[im 22/28]
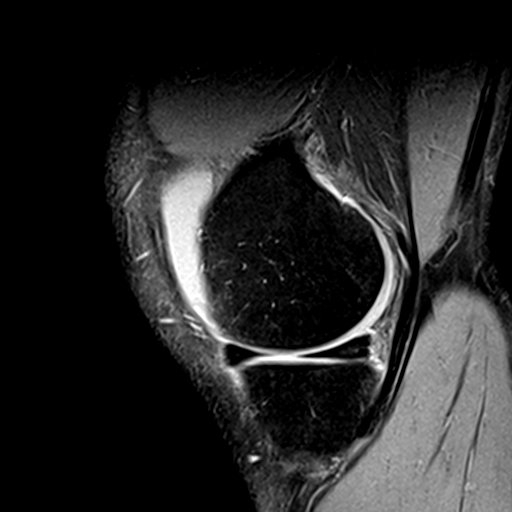
[im 28/28]
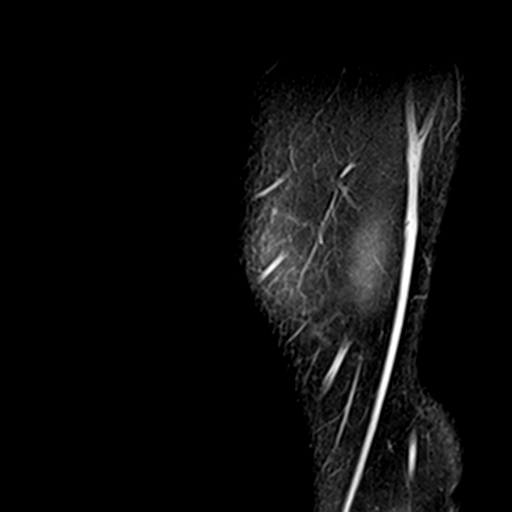

[Series 8: PD fat-sat · coronal · 3.0mm · 0.29mm/px · 6 of 28 slices shown (2 of 3)]
[im 1/28]
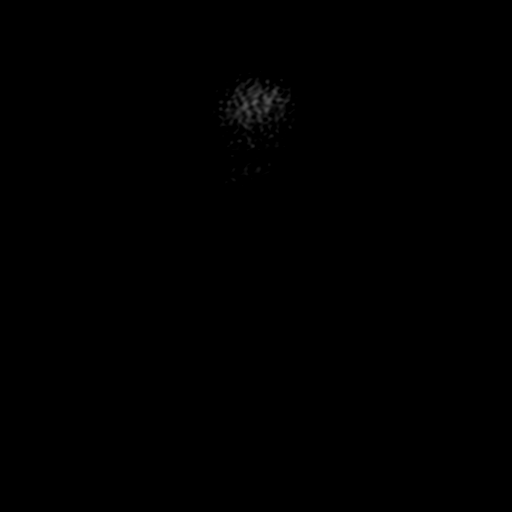
[im 6/28]
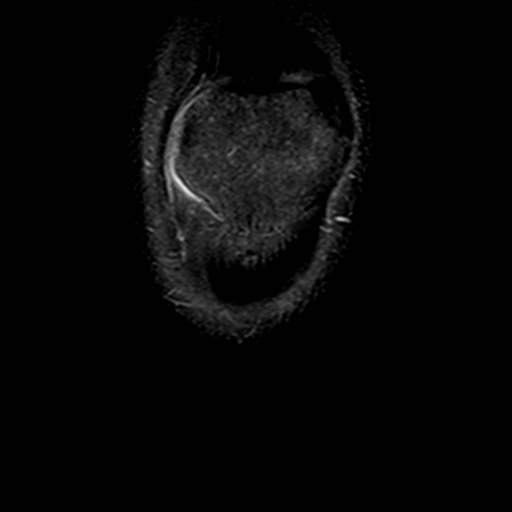
[im 11/28]
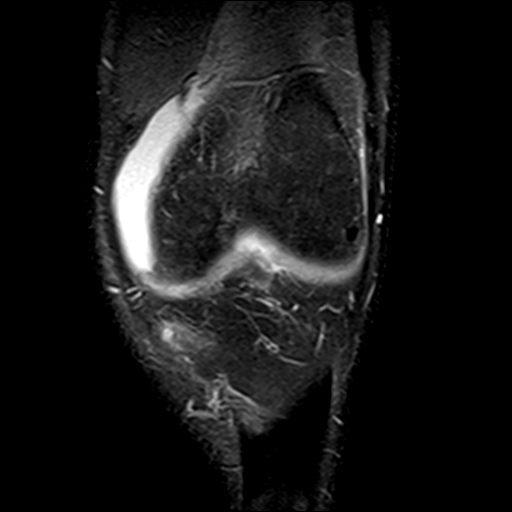
[im 17/28]
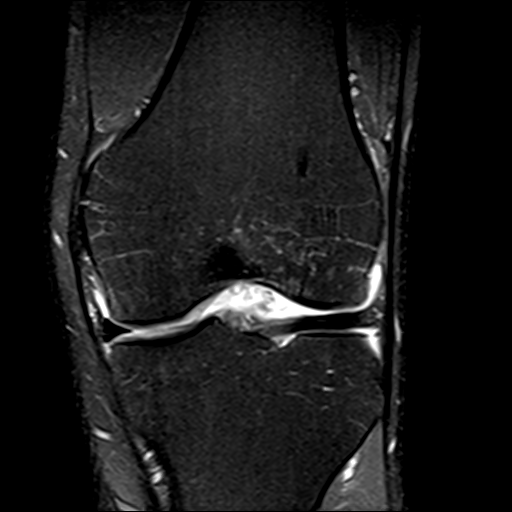
[im 22/28]
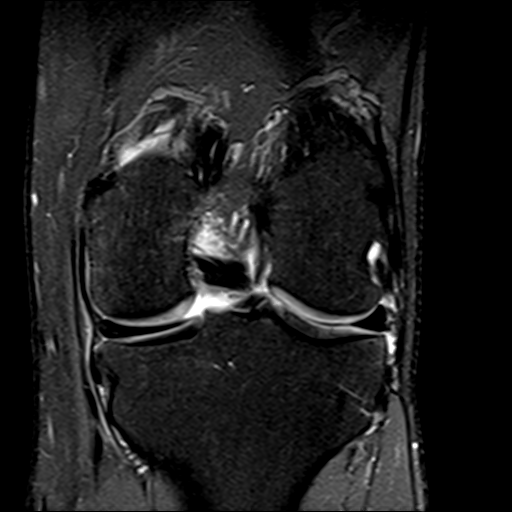
[im 28/28]
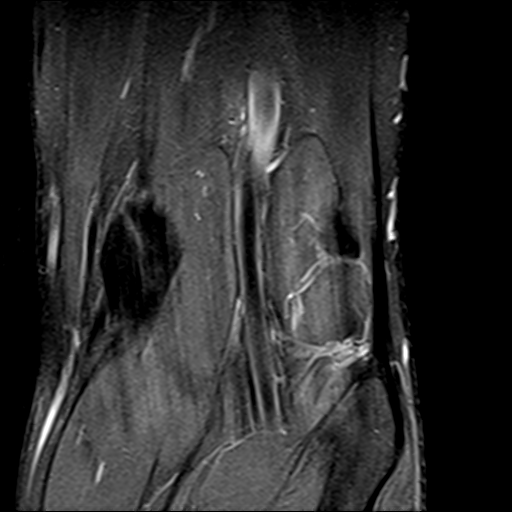

[Series 9: PD fat-sat · coronal · 2.0mm · 0.29mm/px · 2 of 11 slices shown (3 of 3)]
[im 1/11]
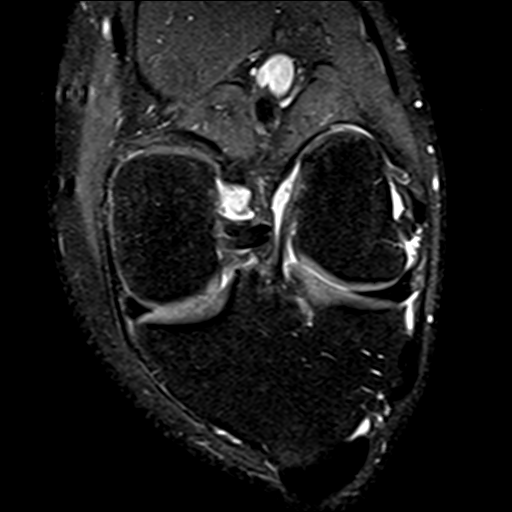
[im 11/11]
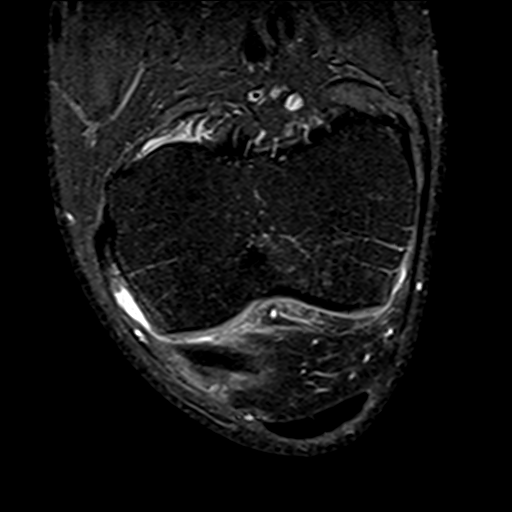

[24 of 40 positions shown; findings below may reference images not displayed]

FINDINGS: MENISCI

Medial meniscus:  Intact.

Lateral meniscus:  Intact.

LIGAMENTS

Cruciates:  Intact ACL and PCL.

Collaterals: Medial collateral ligament is intact. Lateral
collateral ligament complex is intact.

CARTILAGE

Patellofemoral: Partial-thickness cartilage loss of the trochlear
groove. Mild partial-thickness cartilage loss of the patellar apex.

Medial: Mild partial-thickness cartilage loss of the medial
femorotibial compartment.

Lateral:  No chondral defect.

Joint: Moderate joint effusion. Minimal edema in Hoffa's fat. No
plical thickening.

Popliteal Fossa:  No Baker cyst. Intact popliteus tendon.

Extensor Mechanism: Intact quadriceps tendon. Intact patellar
tendon. Intact medial patellar retinaculum. Intact lateral patellar
retinaculum. Intact MPFL.

Bones:  No acute osseous abnormality.  No aggressive osseous lesion.

Other: No fluid collection or hematoma.
IMPRESSION: 1. No meniscal or ligamentous injury of the left knee.
2. Partial-thickness cartilage loss of the trochlear groove. Mild
partial-thickness cartilage loss of the patellar apex.
3. Mild partial-thickness cartilage loss of the medial femorotibial
compartment.

## 2020-07-20 ENCOUNTER — Other Ambulatory Visit: Payer: Self-pay | Admitting: *Deleted

## 2020-07-20 NOTE — Telephone Encounter (Signed)
Received VM from patient requesting refill on Xanax.   Ok to refill??  Last office visit 03/31/2018.  Last refill 07/21/2019.

## 2020-07-21 MED ORDER — ALPRAZOLAM 0.5 MG PO TABS: 0.5000 mg | ORAL_TABLET | Freq: Three times a day (TID) | ORAL | 0 refills | Status: DC | PRN

## 2020-08-30 ENCOUNTER — Other Ambulatory Visit: Payer: Self-pay | Admitting: Adult Health

## 2020-08-30 ENCOUNTER — Encounter: Payer: Self-pay | Admitting: Adult Health

## 2020-08-30 DIAGNOSIS — U071 COVID-19: Secondary | ICD-10-CM

## 2020-08-30 NOTE — Progress Notes (Signed)
I connected by phone with Aaron Williams on 08/30/2020 at 4:51 PM to discuss the potential use of a new treatment for mild to moderate COVID-19 viral infection in non-hospitalized patients.  This patient is a 49 y.o. male that meets the FDA criteria for Emergency Use Authorization of COVID monoclonal antibody casirivimab/imdevimab.  Has a (+) direct SARS-CoV-2 viral test result  Has mild or moderate COVID-19   Is NOT hospitalized due to COVID-19  Is within 10 days of symptom onset  Has at least one of the high risk factor(s) for progression to severe COVID-19 and/or hospitalization as defined in EUA.  Specific high risk criteria : BMI > 25   I have spoken and communicated the following to the patient or parent/caregiver regarding COVID monoclonal antibody treatment:  1. FDA has authorized the emergency use for the treatment of mild to moderate COVID-19 in adults and pediatric patients with positive results of direct SARS-CoV-2 viral testing who are 38 years of age and older weighing at least 40 kg, and who are at high risk for progressing to severe COVID-19 and/or hospitalization.  2. The significant known and potential risks and benefits of COVID monoclonal antibody, and the extent to which such potential risks and benefits are unknown.  3. Information on available alternative treatments and the risks and benefits of those alternatives, including clinical trials.  4. Patients treated with COVID monoclonal antibody should continue to self-isolate and use infection control measures (e.g., wear mask, isolate, social distance, avoid sharing personal items, clean and disinfect "high touch" surfaces, and frequent handwashing) according to CDC guidelines.   5. The patient or parent/caregiver has the option to accept or refuse COVID monoclonal antibody treatment.  After reviewing this information with the patient, The patient agreed to proceed with receiving casirivimab\imdevimab infusion and  will be provided a copy of the Fact sheet prior to receiving the infusion. [name] 08/30/2020 4:51 PM

## 2020-08-31 ENCOUNTER — Ambulatory Visit (HOSPITAL_COMMUNITY): Payer: No Typology Code available for payment source | Attending: Pulmonary Disease

## 2020-09-01 ENCOUNTER — Encounter: Payer: Self-pay | Admitting: Emergency Medicine

## 2020-09-01 ENCOUNTER — Emergency Department
Admission: EM | Admit: 2020-09-01 | Discharge: 2020-09-01 | Disposition: A | Discharge status: Home / Self Care | Payer: HRSA Program | Attending: Emergency Medicine | Admitting: Emergency Medicine

## 2020-09-01 ENCOUNTER — Other Ambulatory Visit: Payer: Self-pay

## 2020-09-01 ENCOUNTER — Emergency Department: Payer: HRSA Program

## 2020-09-01 DIAGNOSIS — Z5321 Procedure and treatment not carried out due to patient leaving prior to being seen by health care provider: Secondary | ICD-10-CM | POA: Insufficient documentation

## 2020-09-01 DIAGNOSIS — R05 Cough: Secondary | ICD-10-CM | POA: Insufficient documentation

## 2020-09-01 DIAGNOSIS — R110 Nausea: Secondary | ICD-10-CM | POA: Diagnosis not present

## 2020-09-01 DIAGNOSIS — U071 COVID-19: Secondary | ICD-10-CM | POA: Insufficient documentation

## 2020-09-01 DIAGNOSIS — R509 Fever, unspecified: Secondary | ICD-10-CM | POA: Insufficient documentation

## 2020-09-01 DIAGNOSIS — R0602 Shortness of breath: Secondary | ICD-10-CM | POA: Insufficient documentation

## 2020-09-01 LAB — COMPREHENSIVE METABOLIC PANEL
ALT: 27 U/L (ref 0–44)
AST: 35 U/L (ref 15–41)
Albumin: 4.2 g/dL (ref 3.5–5.0)
Alkaline Phosphatase: 52 U/L (ref 38–126)
Anion gap: 11 (ref 5–15)
BUN: 14 mg/dL (ref 6–20)
CO2: 24 mmol/L (ref 22–32)
Calcium: 9.3 mg/dL (ref 8.9–10.3)
Chloride: 103 mmol/L (ref 98–111)
Creatinine, Ser: 1.06 mg/dL (ref 0.61–1.24)
GFR calc Af Amer: >60 mL/min (ref >60)
GFR calc non Af Amer: >60 mL/min (ref >60)
Glucose, Bld: 136 mg/dL — ABNORMAL HIGH (ref 70–99)
Potassium: 3.7 mmol/L (ref 3.5–5.1)
Sodium: 138 mmol/L (ref 135–145)
Total Bilirubin: 1.2 mg/dL (ref 0.3–1.2)
Total Protein: 7.4 g/dL (ref 6.5–8.1)

## 2020-09-01 LAB — CBC WITH DIFFERENTIAL/PLATELET
Abs Immature Granulocytes: 0.02 10*3/uL (ref 0.00–0.07)
Basophils Absolute: 0 10*3/uL (ref 0.0–0.1)
Basophils Relative: 0 %
Eosinophils Absolute: 0 10*3/uL (ref 0.0–0.5)
Eosinophils Relative: 1 %
HCT: 38.6 % — ABNORMAL LOW (ref 39.0–52.0)
Hemoglobin: 13.7 g/dL (ref 13.0–17.0)
Immature Granulocytes: 1 %
Lymphocytes Relative: 31 %
Lymphs Abs: 1 10*3/uL (ref 0.7–4.0)
MCH: 27.7 pg (ref 26.0–34.0)
MCHC: 35.5 g/dL (ref 30.0–36.0)
MCV: 78.1 fL — ABNORMAL LOW (ref 80.0–100.0)
Monocytes Absolute: 0.3 10*3/uL (ref 0.1–1.0)
Monocytes Relative: 9 %
Neutro Abs: 1.9 10*3/uL (ref 1.7–7.7)
Neutrophils Relative %: 58 %
Platelets: 120 10*3/uL — ABNORMAL LOW (ref 150–400)
RBC: 4.94 MIL/uL (ref 4.22–5.81)
RDW: 12.6 % (ref 11.5–15.5)
WBC: 3.2 10*3/uL — ABNORMAL LOW (ref 4.0–10.5)
nRBC: 0 % (ref 0.0–0.2)

## 2020-09-01 LAB — TROPONIN I (HIGH SENSITIVITY): Troponin I (High Sensitivity): 8 ng/L (ref <18)

## 2020-09-01 MED ORDER — ONDANSETRON 4 MG PO TBDP
4.0000 mg | ORAL_TABLET | Freq: Once | ORAL | Status: AC
  Administered 2020-09-01: 4 mg via ORAL
  Filled 2020-09-01: qty 1

## 2020-09-01 NOTE — ED Triage Notes (Signed)
Pt to triage via w/c with no distress noted; EMS brought pt in from home for c/o body aches, fever, SHOB and nonprod cough and nausea; +COVID 8 days ago

## 2020-09-01 NOTE — ED Notes (Signed)
Called for new VS  No answer

## 2020-09-01 NOTE — ED Notes (Signed)
No answer in lobby when called for v/s

## 2020-09-03 ENCOUNTER — Other Ambulatory Visit: Payer: Self-pay | Admitting: Family Medicine

## 2020-09-03 MED ORDER — BENZONATATE 200 MG PO CAPS: 200.0000 mg | ORAL_CAPSULE | Freq: Three times a day (TID) | ORAL | 0 refills | Status: DC | PRN

## 2020-09-03 MED ORDER — OXYCODONE-ACETAMINOPHEN 7.5-325 MG PO TABS: 1.0000 | ORAL_TABLET | ORAL | 0 refills | Status: DC | PRN

## 2020-09-03 MED ORDER — PROMETHAZINE HCL 25 MG PO TABS: 25.0000 mg | ORAL_TABLET | Freq: Four times a day (QID) | ORAL | 0 refills | Status: DC | PRN

## 2020-12-05 ENCOUNTER — Inpatient Hospital Stay
Admit: 2020-12-06 | Discharge: 2020-12-12 | Disposition: A | Discharge status: Home / Self Care | Payer: PRIVATE HEALTH INSURANCE | Admitting: Internal Medicine

## 2020-12-05 ENCOUNTER — Emergency Department: Admit: 2020-12-06 | Payer: PRIVATE HEALTH INSURANCE | Primary: Family Medicine

## 2020-12-05 DIAGNOSIS — I480 Paroxysmal atrial fibrillation: Secondary | ICD-10-CM

## 2020-12-05 NOTE — Telephone Encounter (Signed)
-----   Message from Scot Dock sent at 12/05/2020  1:28 PM EST -----  Subject: Appointment Request    Reason for Call: Urgent Return from RN Triage    QUESTIONS  Type of Appointment? New Patient/New to Provider  Reason for appointment request? No appointments available during search  Additional Information for Provider? NT return call, patient needing to   schedule a New patient appointment with any provider sometime this week   for ED follow up. Please return call to assist, thank you  ---------------------------------------------------------------------------  --------------  CALL BACK INFO  What is the best way for the office to contact you? OK to leave message on   voicemail  Preferred Call Back Phone Number? [phone]  ---------------------------------------------------------------------------  --------------  SCRIPT ANSWERS  Patient needs to be seen today or tomorrow? Yes   Nurse Name? Ginger  Have you been diagnosed with, awaiting test results for, or told that you   are suspected of having COVID-19 (Coronavirus)? (If patient has tested   negative or was tested as a requirement for work, school, or travel and   not based on symptoms, answer no)? No  Within the past two weeks have you developed any of the following symptoms   (answer ???no??? if symptoms have been present longer than 2 weeks or began   more than 2 weeks ago)? Fever or Chills, Cough, Shortness of breath or   difficulty breathing, Loss of taste or smell, Sore throat, Nasal   congestion, Sneezing or runny nose, Fatigue or generalized body aches   (answer no if pain is specific to a body part e.g. back pain), Diarrhea,   Headache? No  Have you had close contact with someone with COVID-19 in the last 14 days?   No  (Service Expert ??? click yes below to proceed with [employer] As Usual   Scheduling)? Yes

## 2020-12-05 NOTE — Telephone Encounter (Signed)
Received call from  [place]  at [facility] with [employer].    Limited assessment  Not with caller  Caller attempted to conference pt in at the end of call regarding disposition  Pt is currently at work         Brief description of triage:     49 y/o asthmatic, caller reports pt placed an ice pack on chest yesterday states he was having inflammation.   Pt  Has been short of breath with minimal exertion,   Talking in phrases per spouse   Pt was given an inhaler, prednisone,, and azithromycin  Pt using inhaler more frequently has a non productive cough       49 y/o  Asthmatic 2 weeks ago pt was getting sick was seen at  Urgent care, pt  Did a covid  Test it was negative,  Bronchitis, asthmatic, pt was given meds for 5 days given an inhaler and prednisone.    When pt is active he has to take  A breath because  He cant complete a sentence       Pt was seen at urgent care regarding difficulty breathing pt also has asthma     Triage indicates for patient to ED now     Message sent to scheduling in regards to establishing primary care provider       Care advice provided, patient verbalizes understanding; denies any other questions or concerns; instructed to call back for any new or worsening symptoms.      Reason for Disposition  ??? MODERATE difficulty breathing (e.g., speaks in phrases, SOB even at rest, pulse 100-120) of new onset or worse than normal    Answer Assessment - Initial Assessment Questions  1. RESPIRATORY STATUS: "Describe your breathing?" (e.g., wheezing, shortness of breath, unable to speak, severe coughing)       Pt has to stop talking     2. ONSET: "When did this breathing problem begin?"       2 weeks     3. PATTERN "Does the difficult breathing come and go, or has it been constant since it started?"      Only when he is active     4. SEVERITY: "How bad is your breathing?" (e.g., mild, moderate, severe)     - MILD: No SOB at rest, mild SOB with walking, speaks normally in sentences, can lay  down, no retractions, pulse < 100.     - MODERATE: SOB at rest, SOB with minimal exertion and prefers to sit, cannot lie down flat, speaks in phrases, mild retractions, audible wheezing, pulse 100-120.     - SEVERE: Very SOB at rest, speaks in single words, struggling to breathe, sitting hunched forward, retractions, pulse > 120       Pt snores  Minimal exertion  Moderate    5. RECURRENT SYMPTOM: "Have you had difficulty breathing before?" If so, ask: "When was the last time?" and "What happened that time?"      Jan 2020    Bronchitis,     6. CARDIAC HISTORY: "Do you have any history of heart disease?" (e.g., heart attack, angina, bypass surgery, angioplasty)      Denies     7. LUNG HISTORY: "Do you have any history of lung disease?"  (e.g., pulmonary embolus, asthma, emphysema)     Asthma      8. CAUSE: "What do you think is causing the breathing problem?"      Asthmatic  9. OTHER SYMPTOMS: "Do you have any other symptoms? (e.g., dizziness, runny nose, cough, chest pain, fever)     Cough, sore throat   icepack on chest       10. PREGNANCY: "Is there any chance you are pregnant?" "When was your last menstrual period?"       N/a     11. TRAVEL: "Have you traveled out of the country in the last month?" (e.g., travel history, exposures)       Denies    Protocols used: BREATHING DIFFICULTY-ADULT-[place]

## 2020-12-05 NOTE — ED Provider Notes (Signed)
ED Attending shared visit  CC: No         [facility]  Department of Emergency Medicine   ED  Encounter Note  Admit Date/RoomTime: 12/05/2020  8:52 PM  ED Room: 14/14  NAME: Thomas Walton  DOB: [birth year]  MRN: [id number]     Chief Complaint:  Shortness of Breath (SOB since last week, was seen and treated at urgent care, not getting any better)    HISTORY OF PRESENT ILLNESS        Thomas Walton is a 49 y.o. male who presents to the ED for evaluation of persistent shortness of breath for over 2 weeks.  Patient reports being treated at urgent care for asthmatic bronchitis with azithromycin, albuterol MDI and prednisone for 4 days.  He tested negative for COVID-19 at that time, has no known exposure and is vaccinated.  Over Thanksgiving he did go to [place] via car for 6-hour trip and back with no worsening of his symptoms however, he has not had any improvement of his symptoms.  He continues to have a " inflammation" in his chest which he put on an ice pack to his anterior chest wall last night with no improvement, shortness of breath with occasional wheezing, dyspnea on exertion but denies associated chest pain, productive cough without hemoptysis and prominent fatigue.  There is been no associated syncopal episode, loss of taste or smell, nausea, vomiting, bowel changes, abdominal pain, dysuria, hematuria, calf pain or leg swelling.  He has never been a smoker and there is no COPD or emphysema.  With his asthma history he has been using his albuterol MDI about 2 or 3 times a day, 2 puffs at each time.  There is no prior history of blood clots and is not on any anticoagulants.  There is no family history of early cardiac death and he reports having a negative stress test and echo [place] beginning [place] and with no known coronary artery disease or congestive heart failure.  His symptoms are aggravated by exertion and activity and unalleviated by interventions as mentioned above.   His symptoms are moderate in severity and persistent.    ROS   Pertinent positives and negatives are stated within HPI, all other systems reviewed and are negative.    Past Medical History:  has a past medical history of Asthma.    Surgical History:  has no past surgical history on file.    Social History:  reports that he has never smoked. He has never used smokeless tobacco. He reports current alcohol use. He reports that he does not use drugs.    Family History: family history is not on file.     Allergies: Patient has no known allergies.    PHYSICAL EXAM   Oxygen Saturation Interpretation: Normal on room air analysis.        ED Triage Vitals   BP Temp Temp Source Pulse Resp SpO2 Height Weight   12/05/20 1804 12/05/20 1804 12/05/20 1804 12/05/20 1804 12/05/20 1804 12/05/20 1804 12/05/20 1828 12/05/20 1828   (!) 154/90 98 ??F (36.7 ??C) Temporal 84 18 97 % 6' (1.829 m) 270 lb (122.5 kg)       Physical Exam  Constitutional/General: Alert and oriented x3, well appearing, non toxic  HEENT:  NC/NT. PERRLA.  Oropharynx pink and moist without tonsillar hypertrophy or exudate.  Airway patent.  Neck: Supple, full ROM. No midline vertebral tenderness or crepitus.   Respiratory: Lung sounds  clear and diminished throughout. No wheezes, rhonchi or stridor. Not in respiratory distress or conversational dyspnea.  CV:  Regular rate.  No resting tachycardia.  Regular rhythm. No murmurs or rubs. 2+ distal pulses.  GI:  Abdomen soft, non-tender, non-distended. +BS. No rebound, guarding, or rigidity. No pulsatile masses.  Musculoskeletal: Moves all extremities x 4. Warm and well perfused. Capillary refill <3 seconds.  No calf tenderness, palpable cords, pitting edema or clinical evidence of DVT bilaterally.  Integument: Skin warm and dry. No rashes.   Neurologic: Alert and oriented with no focal deficits, symmetric strength 5/5 in the upper and lower extremities bilaterally.  Psychiatric: Normal affect.    Lab / Imaging Results   (All  laboratory and radiology results have been personally reviewed by myself)  Labs:  Results for orders placed or performed during the hospital encounter of 12/05/20   COVID-19, Rapid    Specimen: Nasopharyngeal Swab   Result Value Ref Range    SARS-CoV-2, NAAT Not Detected Not Detected   Basic Metabolic Panel   Result Value Ref Range    Sodium 139 132 - 146 mmol/L    Potassium 4.2 3.5 - 5.0 mmol/L    Chloride 103 98 - 107 mmol/L    CO2 25 22 - 29 mmol/L    Anion Gap 11 7 - 16 mmol/L    Glucose 219 (H) 74 - 99 mg/dL    BUN 12 6 - 20 mg/dL    CREATININE 1.3 (H) 0.7 - 1.2 mg/dL    GFR Non-African American >60 >=60 mL/min/1.73    GFR African American >60     Calcium 9.0 8.6 - 10.2 mg/dL   Magnesium   Result Value Ref Range    Magnesium 2.0 1.6 - 2.6 mg/dL   CBC Auto Differential   Result Value Ref Range    WBC 5.4 4.5 - 11.5 E9/L    RBC 4.69 3.80 - 5.80 E12/L    Hemoglobin 14.2 12.5 - 16.5 g/dL    Hematocrit 56.2 56.3 - 54.0 %    MCV 86.6 80.0 - 99.9 fL    MCH 30.3 26.0 - 35.0 pg    MCHC 35.0 (H) 32.0 - 34.5 %    RDW 14.2 11.5 - 15.0 fL    Platelets 255 130 - 450 E9/L    MPV 9.0 7.0 - 12.0 fL    Neutrophils % 45.2 43.0 - 80.0 %    Immature Granulocytes % 0.2 0.0 - 5.0 %    Lymphocytes % 40.6 20.0 - 42.0 %    Monocytes % 8.5 2.0 - 12.0 %    Eosinophils % 4.2 0.0 - 6.0 %    Basophils % 1.3 0.0 - 2.0 %    Neutrophils Absolute 2.46 1.80 - 7.30 E9/L    Immature Granulocytes # 0.01 E9/L    Lymphocytes Absolute 2.21 1.50 - 4.00 E9/L    Monocytes Absolute 0.46 0.10 - 0.95 E9/L    Eosinophils Absolute 0.23 0.05 - 0.50 E9/L    Basophils Absolute 0.07 0.00 - 0.20 E9/L   Troponin   Result Value Ref Range    Troponin, High Sensitivity 7 0 - 11 ng/L   Brain Natriuretic Peptide   Result Value Ref Range    Pro-BNP 12 0 - 125 pg/mL   D-Dimer, Quantitative   Result Value Ref Range    D-Dimer, Quant 377 ng/mL DDU   Troponin   Result Value Ref Range    Troponin, High Sensitivity 8 0 -  11 ng/L   EKG 12 Lead   Result Value Ref Range     Ventricular Rate 72 BPM    Atrial Rate 72 BPM    P-R Interval 156 ms    QRS Duration 102 ms    Q-T Interval 404 ms    QTc Calculation (Bazett) 442 ms    P Axis 49 degrees    R Axis -50 degrees    T Axis -48 degrees     Imaging:  All Radiology results interpreted by Radiologist unless otherwise noted.  CTA PULMONARY W CONTRAST   Final Result   No evidence of pulmonary embolism or acute pulmonary abnormality.         XR CHEST PORTABLE   Final Result   No acute process.           EKG #1:  Interpreted by emergency department attending physician unless otherwise noted.    ED Course / Medical Decision Making     Medications   sodium chloride flush 0.9 % injection 10 mL (has no administration in time range)   ipratropium-albuterol (DUONEB) nebulizer solution 1 ampule (1 ampule Inhalation Given 12/05/20 2238)   aspirin chewable tablet 324 mg (324 mg Oral Given 12/05/20 2237)   iopamidol (ISOVUE-370) 76 % injection 75 mL (75 mLs IntraVENous Given 12/05/20 2321)     ED Course as of 12/06/20 0019   Mon Dec 05, 2020   2154 EKG @ 2142:  This EKG is signed and interpreted by me.    Rate: 72  Rhythm: Sinus  Interpretation: NSR, left axis, LAFB, inc RBBB, inverted t waves lat, Non Specific ST Changes    Comparison: no previous EKG available   [ME]      ED Course User Index  [ME] Raphael Gibney, DO     Re-examination:  12/05/20       Time: 2255  Patient???s condition remains unchanged and remains stable.  Patient reports no change after DuoNeb breathing treatment.  He was updated on lab results, abnormal EKG and pending CTA.  Time: 2355  Patient evaluated by Dr. Jearl Klinefelter, updated on results and plan of care.     Consult(s):   Spoke with Dr. Audley Hose (Medicine).  Discussed case.  They will admit this patient.    Procedure(s):   none    MDM:   Patient presents with dyspnea on exertion worsening in the last 2 weeks now having chest tightness while walking up stairs.  Has extensive family history of cardiovascular disease, highly  concerning EKG, for delta troponin remains unchanged, D-dimer was elevated CTA was obtained which shows no pulmonary embolism.  High concern for acute coronary syndrome and anginal equivalents.  Spoke with medicine patient kept for further observation    Plan of Care/Counseling:  Donnalee Curry, APRN - CNP and EM Attending Physician reviewed today's visit with the patient and spouse / life partner in addition to providing specific details for the plan of care and counseling regarding the diagnosis and prognosis.  Questions are answered at this time and are agreeable with the plan.    ASSESSMENT     1. Dyspnea on exertion    2. Abnormal EKG    3. Acute chest pain      PLAN   Observation Admission to Telemetry Unit @ [facility]  Patient condition is stable    New Medications     New Prescriptions    No medications on file     Electronically signed by Madlyn Frankel  Jearl KlinefelterEggleston, DO   DD: 12/05/20  **This report was transcribed using voice recognition software. Every effort was made to ensure accuracy; however, inadvertent computerized transcription errors may be present.  END OF ED PROVIDER NOTE    ATTENDING PROVIDER ATTESTATION:     Harlene Saltsavid Branham presented to the emergency department for evaluation of Shortness of Breath (SOB since last week, was seen and treated at urgent care, not getting any better)    I have reviewed and discussed the case, including pertinent history (medical, surgical, family and social) and exam findings with the Midlevel and the Nurse assigned to ONEOKDavid Winne.  I have personally performed and/or participated in the history, exam, medical decision making, and procedures and agree with all pertinent clinical information.      The nursing notes within the ED encounter and vital signs as below have been reviewed by myself  Vitals:    12/05/20 1804   BP: (!) 154/90   Pulse: 84   Resp: 18   Temp: 98 ??F (36.7 ??C)   SpO2: 97%         Oxygen Saturation Interpretation: Normal    The cardiac monitor revealed NSR with  a heart rate in the 80s as interpreted by me. The cardiac monitor was ordered secondary to the patient's heart rate and to monitor the patient for dysrhythmia.   CPT [phone]    The patient???s available past medical records and past encounters were reviewed.         MDM: Asked to see this patient by APP, required direct physician involvement secondary to complexity of case,       I, Dr. Jearl KlinefelterEggleston am the primary provider of record      I have reviewed my findings and recommendations with Harlene Saltsavid Rybacki and members of family present at the time of disposition.       --------------------------------- IMPRESSION AND DISPOSITION ---------------------------------    IMPRESSION  1. Dyspnea on exertion    2. Abnormal EKG    3. Acute chest pain        DISPOSITION  Disposition: Admit to telemetry  Patient condition is stable       Raphael GibneyMatthew D Nissim Fleischer, DO  12/06/20 0021

## 2020-12-06 ENCOUNTER — Encounter: Primary: Family Medicine

## 2020-12-06 ENCOUNTER — Observation Stay: Payer: PRIVATE HEALTH INSURANCE | Primary: Family Medicine

## 2020-12-06 LAB — MAGNESIUM: Magnesium: 2 mg/dL (ref 1.6–2.6)

## 2020-12-06 LAB — BASIC METABOLIC PANEL
Anion Gap: 11 mmol/L (ref 7–16)
Anion Gap: 10 mmol/L (ref 7–16)
BUN: 12 mg/dL (ref 6–20)
BUN: 11 mg/dL (ref 6–20)
CO2: 25 mmol/L (ref 22–29)
CO2: 25 mmol/L (ref 22–29)
Calcium: 9 mg/dL (ref 8.6–10.2)
Calcium: 9 mg/dL (ref 8.6–10.2)
Chloride: 103 mmol/L (ref 98–107)
Chloride: 103 mmol/L (ref 98–107)
Creatinine: 1.3 mg/dL — ABNORMAL HIGH (ref 0.7–1.2)
Creatinine: 1 mg/dL (ref 0.7–1.2)
GFR African American: >60
GFR African American: >60
GFR Non-African American: >60 mL/min/{1.73_m2} (ref >=60)
GFR Non-African American: >60 mL/min/{1.73_m2} (ref >=60)
Glucose: 219 mg/dL — ABNORMAL HIGH (ref 74–99)
Glucose: 135 mg/dL — ABNORMAL HIGH (ref 74–99)
Potassium: 4.2 mmol/L (ref 3.5–5.0)
Potassium: 4 mmol/L (ref 3.5–5.0)
Sodium: 139 mmol/L (ref 132–146)
Sodium: 138 mmol/L (ref 132–146)

## 2020-12-06 LAB — CBC
Hematocrit: 41.8 % (ref 37.0–54.0)
Hemoglobin: 13.7 g/dL (ref 12.5–16.5)
MCH: 28.1 pg (ref 26.0–35.0)
MCHC: 32.8 % (ref 32.0–34.5)
MCV: 85.7 fL (ref 80.0–99.9)
MPV: 8.8 fL (ref 7.0–12.0)
Platelets: 258 E9/L (ref 130–450)
RBC: 4.88 E12/L (ref 3.80–5.80)
RDW: 14.4 fL (ref 11.5–15.0)
WBC: 5.1 E9/L (ref 4.5–11.5)

## 2020-12-06 LAB — CBC WITH AUTO DIFFERENTIAL
Basophils %: 1.3 % (ref 0.0–2.0)
Basophils Absolute: 0.07 E9/L (ref 0.00–0.20)
Eosinophils %: 4.2 % (ref 0.0–6.0)
Eosinophils Absolute: 0.23 E9/L (ref 0.05–0.50)
Hematocrit: 40.6 % (ref 37.0–54.0)
Hemoglobin: 14.2 g/dL (ref 12.5–16.5)
Immature Granulocytes #: 0.01 E9/L
Immature Granulocytes %: 0.2 % (ref 0.0–5.0)
Lymphocytes %: 40.6 % (ref 20.0–42.0)
Lymphocytes Absolute: 2.21 E9/L (ref 1.50–4.00)
MCH: 30.3 pg (ref 26.0–35.0)
MCHC: 35 % — ABNORMAL HIGH (ref 32.0–34.5)
MCV: 86.6 fL (ref 80.0–99.9)
MPV: 9 fL (ref 7.0–12.0)
Monocytes %: 8.5 % (ref 2.0–12.0)
Monocytes Absolute: 0.46 E9/L (ref 0.10–0.95)
Neutrophils %: 45.2 % (ref 43.0–80.0)
Neutrophils Absolute: 2.46 E9/L (ref 1.80–7.30)
Platelets: 255 E9/L (ref 130–450)
RBC: 4.69 E12/L (ref 3.80–5.80)
RDW: 14.2 fL (ref 11.5–15.0)
WBC: 5.4 E9/L (ref 4.5–11.5)

## 2020-12-06 LAB — EKG 12-LEAD
Atrial Rate: 72 {beats}/min
P Axis: 49 degrees
P-R Interval: 156 ms
Q-T Interval: 404 ms
QRS Duration: 102 ms
QTc Calculation (Bazett): 442 ms
R Axis: -50 degrees
T Axis: -48 degrees
Ventricular Rate: 72 {beats}/min

## 2020-12-06 LAB — LIPID PANEL
Cholesterol, Total: 220 mg/dL — ABNORMAL HIGH (ref 0–199)
HDL: 33 mg/dL (ref >40)
LDL Calculated: 120 mg/dL — ABNORMAL HIGH (ref 0–99)
Triglycerides: 334 mg/dL — ABNORMAL HIGH (ref 0–149)
VLDL Cholesterol Calculated: 67 mg/dL

## 2020-12-06 LAB — SPECIMEN REJECTION

## 2020-12-06 LAB — HEMOGLOBIN A1C: Hemoglobin A1C: 7.3 % — ABNORMAL HIGH (ref 4.0–5.6)

## 2020-12-06 LAB — TROPONIN
Troponin, High Sensitivity: 7 ng/L (ref 0–11)
Troponin, High Sensitivity: 8 ng/L (ref 0–11)
Troponin, High Sensitivity: 8 ng/L (ref 0–11)
Troponin, High Sensitivity: 9 ng/L (ref 0–11)

## 2020-12-06 LAB — D-DIMER, QUANTITATIVE: D-Dimer, Quant: 377 ng/mL DDU

## 2020-12-06 LAB — BRAIN NATRIURETIC PEPTIDE: Pro-BNP: 12 pg/mL (ref 0–125)

## 2020-12-06 LAB — COVID-19, RAPID: SARS-CoV-2, NAAT: NOT DETECTED

## 2020-12-06 MED ORDER — ONDANSETRON 4 MG PO TBDP
4 MG | Freq: Three times a day (TID) | ORAL | Status: DC | PRN
Start: 2020-12-06 — End: 2020-12-12

## 2020-12-06 MED ORDER — NORMAL SALINE FLUSH 0.9 % IV SOLN
0.9 | Freq: Two times a day (BID) | INTRAVENOUS | Status: DC
Start: 2020-12-06 — End: 2020-12-12
  Administered 2020-12-06 – 2020-12-12 (×12): 10 mL via INTRAVENOUS

## 2020-12-06 MED ORDER — SODIUM CHLORIDE 0.9 % IV SOLN
0.9 | INTRAVENOUS | Status: DC | PRN
Start: 2020-12-06 — End: 2020-12-12

## 2020-12-06 MED ORDER — PREDNISONE 10 MG PO TABS
10 MG | Freq: Once | ORAL | Status: AC
Start: 2020-12-06 — End: 2020-12-07
  Administered 2020-12-07: 13:00:00 50 mg via ORAL

## 2020-12-06 MED ORDER — PREDNISONE 10 MG PO TABS
10 MG | Freq: Once | ORAL | Status: AC
Start: 2020-12-06 — End: 2020-12-06
  Administered 2020-12-07: 03:00:00 50 mg via ORAL

## 2020-12-06 MED ORDER — IPRATROPIUM-ALBUTEROL 0.5-2.5 (3) MG/3ML IN SOLN
Freq: Once | RESPIRATORY_TRACT | Status: AC
Start: 2020-12-06 — End: 2020-12-05
  Administered 2020-12-06: 04:00:00 1 via RESPIRATORY_TRACT

## 2020-12-06 MED ORDER — METOPROLOL TARTRATE 5 MG/5ML IV SOLN
55 MG/ML | INTRAVENOUS | Status: DC | PRN
Start: 2020-12-06 — End: 2020-12-07

## 2020-12-06 MED ORDER — ALBUTEROL SULFATE (2.5 MG/3ML) 0.083% IN NEBU
Freq: Four times a day (QID) | RESPIRATORY_TRACT | Status: DC | PRN
Start: 2020-12-06 — End: 2020-12-12

## 2020-12-06 MED ORDER — SODIUM CHLORIDE 0.9 % IV BOLUS
0.9 % | Freq: Once | INTRAVENOUS | Status: DC
Start: 2020-12-06 — End: 2020-12-07

## 2020-12-06 MED ORDER — PREDNISONE 20 MG PO TABS
20 MG | Freq: Once | ORAL | Status: AC
Start: 2020-12-06 — End: 2020-12-07
  Administered 2020-12-07: 06:00:00 50 mg via ORAL

## 2020-12-06 MED ORDER — NORMAL SALINE FLUSH 0.9 % IV SOLN
0.9 | INTRAVENOUS | Status: DC | PRN
Start: 2020-12-06 — End: 2020-12-12

## 2020-12-06 MED ORDER — ASPIRIN 81 MG PO CHEW
81 MG | Freq: Every day | ORAL | Status: DC
Start: 2020-12-06 — End: 2020-12-12
  Administered 2020-12-07 – 2020-12-12 (×6): 81 mg via ORAL

## 2020-12-06 MED ORDER — NORMAL SALINE FLUSH 0.9 % IV SOLN
0.9 % | Freq: Once | INTRAVENOUS | Status: DC
Start: 2020-12-06 — End: 2020-12-12

## 2020-12-06 MED ORDER — ONDANSETRON HCL 4 MG/2ML IJ SOLN
4 MG/2ML | Freq: Four times a day (QID) | INTRAMUSCULAR | Status: DC | PRN
Start: 2020-12-06 — End: 2020-12-12

## 2020-12-06 MED ORDER — METOPROLOL TARTRATE 50 MG PO TABS
50 MG | Freq: Once | ORAL | Status: AC | PRN
Start: 2020-12-06 — End: 2020-12-06

## 2020-12-06 MED ORDER — ENOXAPARIN SODIUM 40 MG/0.4ML SC SOLN
400.4 MG/0.4ML | Freq: Every day | SUBCUTANEOUS | Status: DC
Start: 2020-12-06 — End: 2020-12-08
  Administered 2020-12-06 – 2020-12-07 (×2): 40 mg via SUBCUTANEOUS

## 2020-12-06 MED ORDER — DILTIAZEM HCL 25 MG/5ML IV SOLN
25 MG/5ML | Freq: Once | INTRAVENOUS | Status: AC | PRN
Start: 2020-12-06 — End: 2020-12-06

## 2020-12-06 MED ORDER — ACETAMINOPHEN 650 MG RE SUPP
650 MG | Freq: Four times a day (QID) | RECTAL | Status: DC | PRN
Start: 2020-12-06 — End: 2020-12-12

## 2020-12-06 MED ORDER — ATORVASTATIN CALCIUM 40 MG PO TABS
40 MG | Freq: Every evening | ORAL | Status: DC
Start: 2020-12-06 — End: 2020-12-12
  Administered 2020-12-07 – 2020-12-12 (×6): 40 mg via ORAL

## 2020-12-06 MED ORDER — NITROGLYCERIN 0.4 MG SL SUBL
0.4 MG | Freq: Once | SUBLINGUAL | Status: DC
Start: 2020-12-06 — End: 2020-12-07

## 2020-12-06 MED ORDER — ASPIRIN 81 MG PO CHEW
81 MG | Freq: Once | ORAL | Status: AC
Start: 2020-12-06 — End: 2020-12-05
  Administered 2020-12-06: 04:00:00 324 mg via ORAL

## 2020-12-06 MED ORDER — PREDNISONE 20 MG PO TABS
20 MG | Freq: Every day | ORAL | Status: DC
Start: 2020-12-06 — End: 2020-12-06

## 2020-12-06 MED ORDER — ALBUTEROL SULFATE HFA 108 (90 BASE) MCG/ACT IN AERS
108 (90 Base) MCG/ACT | Freq: Four times a day (QID) | RESPIRATORY_TRACT | Status: DC | PRN
Start: 2020-12-06 — End: 2020-12-06

## 2020-12-06 MED ORDER — IOPAMIDOL 76 % IV SOLN
76 % | Freq: Once | INTRAVENOUS | Status: AC | PRN
Start: 2020-12-06 — End: 2020-12-05
  Administered 2020-12-06: 04:00:00 75 mL via INTRAVENOUS

## 2020-12-06 MED ORDER — POLYETHYLENE GLYCOL 3350 17 G PO PACK
17 g | Freq: Every day | ORAL | Status: DC | PRN
Start: 2020-12-06 — End: 2020-12-12

## 2020-12-06 MED ORDER — ACETAMINOPHEN 325 MG PO TABS
325 MG | Freq: Four times a day (QID) | ORAL | Status: DC | PRN
Start: 2020-12-06 — End: 2020-12-12
  Administered 2020-12-08 – 2020-12-11 (×4): 650 mg via ORAL

## 2020-12-06 MED ORDER — DIPHENHYDRAMINE HCL 25 MG PO TABS
25 MG | Freq: Once | ORAL | Status: AC
Start: 2020-12-06 — End: 2020-12-07
  Administered 2020-12-07: 13:00:00 50 mg via ORAL

## 2020-12-06 MED FILL — PREDNISONE 10 MG PO TABS: 10 mg | ORAL | Qty: 1

## 2020-12-06 MED FILL — ASPIRIN 81 MG PO CHEW: 81 mg | ORAL | Qty: 4

## 2020-12-06 MED FILL — NORMAL SALINE FLUSH 0.9 % IV SOLN: 0.9 % | INTRAVENOUS | Qty: 10

## 2020-12-06 MED FILL — IPRATROPIUM-ALBUTEROL 0.5-2.5 (3) MG/3ML IN SOLN: 0.5-2.5 (3) MG/3ML | RESPIRATORY_TRACT | Qty: 3

## 2020-12-06 MED FILL — ALBUTEROL SULFATE (2.5 MG/3ML) 0.083% IN NEBU: RESPIRATORY_TRACT | Qty: 3

## 2020-12-06 MED FILL — ENOXAPARIN SODIUM 40 MG/0.4ML SC SOLN: 40 MG/0.4ML | SUBCUTANEOUS | Qty: 0.4

## 2020-12-06 MED FILL — PREDNISONE 20 MG PO TABS: 20 mg | ORAL | Qty: 1

## 2020-12-06 NOTE — Care Coordination-Inpatient (Signed)
Patient place in observation after presenting to [facility] Emergency for c/o SOB x 2 weeks .d EKG revealed normal sinus rhythm, pulmonary disease pattern, incomplete right bundle branch block, left anterior fascicular block, T wave abnormality, consider inferolateral ischemia. Consult cardiology. Npo and CTA Coronary ordered. Met with patient and his fiance at bedside to discuss transition of care.  They live in a 2 story home with bedroom on second level. He has no PCP but in process of arranging and he uses [facility] on [place]. Patient was independent PTA and at this time no discharge needs identified and his fiance will transport home when medically cleared. CM/SW will continue to follow.

## 2020-12-06 NOTE — H&P (Signed)
Inpatient H&P      PCP:  No primary care provider on file.  Admitting Physician:  Fortunato Curling, DO  Consultants: Cardiology  Chief Complaint:    Chief Complaint   Patient presents with   ??? Shortness of Breath     SOB since last week, was seen and treated at urgent care, not getting any better       History of Present Illness  Thomas Walton is a 49 y.o. male who presents to [facility] ER complaining of dyspnea    Thomas Walton has a past medical history that includes asthma.    Thomas Walton initially presented to the ER with complaint of shortness of breath over the past 2 weeks.  Shortness of breath is worsened with exertion such as climbing the stairs.  He went to urgent care prior and was treated for bronchitis with azithromycin, albuterol, prednisone.  He was Covid negative at that time.  He does state that he went on a 6-hour car trip for Thanksgiving.  He has not had any improvement of his shortness of breath symptoms.  He does not necessarily admit to chest pain but admits to "inflammation" in his chest.  He states that he placed ice pack to the chest wall for this.  He denies ever being a smoker denies any COPD.  He does have an asthma history.  In the ER he was found to have abnormal EKG.  He states his last stress test was in 2019 which he states was normal.    CTA of the chest was normal without any evidence of PE.  Troponins have been negative    ER Course  Upon presentation to the ER, routine labwork was performed which revealed creatinine 1.3, glucose 219.  Imaging results are as outlined below in the Imaging section of this note.  EKG revealed normal sinus rhythm, pulmonary disease pattern, incomplete right bundle branch block, left anterior fascicular block, T wave abnormality, consider inferolateral ischemia.  Upon arrival to the ER, patient was 154/90.  The patient received aspirin, DuoNeb in the emergency room and was admitted under the care of sound physicians.    Last Hospital Admission -   None in  EMR    Last Echocardiogram -   None in EMR     ED TRIAGE VITALS  BP: 132/82, Temp: 97 ??F (36.1 ??C), Pulse: 68, Resp: 16, SpO2: 97 %    Vitals:    12/06/20 0253 12/06/20 0400 12/06/20 0521 12/06/20 0845   BP: (!) 146/88 (!) 144/88 (!) 167/98 132/82   Pulse: 75 80 78 68   Resp: Temp: 98 ??F (36.7 ??C) 98.1 ??F (36.7 ??C) 96.5 ??F (35.8 ??C) 97 ??F (36.1 ??C)   TempSrc:  Temporal Temporal Temporal   SpO2: 95% 96% 98% 97%   Weight:   282 lb 3.2 oz (128 kg)    Height:   6' 2.5" (1.892 m)          Histories  Past Medical History:   Diagnosis Date   ??? Asthma      History reviewed. No pertinent surgical history.  Family History   Problem Relation Age of Onset   ??? Heart Attack Father        Home Medications  Prior to Admission medications    Medication Sig Start Date End Date Taking? Authorizing Provider   ALBUTEROL SULFATE, SENSOR, IN Inhale into the lungs   Yes Historical Provider, MD  Allergies  Shellfish-derived products    Social Hx  Social History     Socioeconomic History   ??? Marital status: Single     Spouse name: Not on file   ??? Number of children: Not on file   ??? Years of education: Not on file   ??? Highest education level: Not on file   Occupational History   ??? Not on file   Tobacco Use   ??? Smoking status: Never Smoker   ??? Smokeless tobacco: Never Used   Vaping Use   ??? Vaping Use: Never used   Substance and Sexual Activity   ??? Alcohol use: Yes     Comment: socially   ??? Drug use: Never   ??? Sexual activity: Not on file   Other Topics Concern   ??? Not on file   Social History Narrative   ??? Not on file     Social Determinants of Health     Financial Resource Strain:    ??? Difficulty of Paying Living Expenses: Not on file   Food Insecurity:    ??? Worried About Running Out of Food in the Last Year: Not on file   ??? Ran Out of Food in the Last Year: Not on file   Transportation Needs:    ??? Lack of Transportation (Medical): Not on file   ??? Lack of Transportation (Non-Medical): Not on file   Physical Activity:    ???  Days of Exercise per Week: Not on file   ??? Minutes of Exercise per Session: Not on file   Stress:    ??? Feeling of Stress : Not on file   Social Connections:    ??? Frequency of Communication with Friends and Family: Not on file   ??? Frequency of Social Gatherings with Friends and Family: Not on file   ??? Attends Religious Services: Not on file   ??? Active Member of Clubs or Organizations: Not on file   ??? Attends BankerClub or Organization Meetings: Not on file   ??? Marital Status: Not on file   Intimate Partner Violence:    ??? Fear of Current or Ex-Partner: Not on file   ??? Emotionally Abused: Not on file   ??? Physically Abused: Not on file   ??? Sexually Abused: Not on file   Housing Stability:    ??? Unable to Pay for Housing in the Last Year: Not on file   ??? Number of Places Lived in the Last Year: Not on file   ??? Unstable Housing in the Last Year: Not on file       Review of Systems  All bolded are positive; please see HPI  General:  Fever, chills, diaphoresis, fatigue, malaise, night sweats, weight loss  Psychological:  Anxiety, disorientation, hallucinations.  ENT:  Epistaxis, headaches, vertigo, visual changes.  Cardiovascular:  Chest pain, irregular heartbeats, palpitations, paroxysmal nocturnal dyspnea.  Respiratory:  Shortness of breath, coughing, sputum production, hemoptysis, wheezing, orthopnea.  Gastrointestinal:  Nausea, vomiting, diarrhea, heartburn, constipation, abdominal pain, hematemesis, hematochezia, melena, acholic stools  Genito-Urinary:  Dysuria, urgency, frequency, hematuria  Musculoskeletal:  Joint pain, joint stiffness, joint swelling, muscle pain  Neurology:  Headache, focal neurological deficits, weakness, numbness, paresthesia  Derm:  Rashes, ulcers, excoriations, bruising  Extremities:  Decreased ROM, peripheral edema, mottling    Physical Examination  Vitals:  BP 132/82    Pulse 68    Temp 97 ??F (36.1 ??C) (Temporal)    Resp 16    Ht 6' 2.5" (  1.892 m)    Wt 282 lb 3.2 oz (128 kg)    SpO2 97%    BMI  35.75 kg/m??   General Appearance:  awake, alert, and oriented to person, place, time, and purpose; appears stated age and cooperative; no apparent distress no labored breathing  HEENT:  NCAT; PERRL; conjunctiva pink, sclera clear  Neck:  no adenopathy, bruit, JVD, tenderness, masses, or nodules; supple, symmetrical, trachea midline, thyroid not enlarged  Lung:  clear to auscultation bilaterally; no use of accessory muscles; no rhonchi, rales, or crackles  Heart:  regular rate and regular rhythm without murmur, rub, or gallop  Abdomen:  soft, nontender, nondistended; normoactive bowel sounds; no organomegaly  Extremities:  extremities normal, atraumatic, no cyanosis or edema  Musculokeletal:  no joint swelling, no muscle tenderness. ROM normal in all joints of extremities.   Neurologic:  mental status A&Ox3, thought content appropriate; CN II-XII grossly intact; sensation intact, motor strength 5/5 globally; no slurred speech    Laboratory Data  Recent Results (from the past 24 hour(s))   EKG 12 Lead    Collection Time: 12/05/20  9:42 PM   Result Value Ref Range    Ventricular Rate 72 BPM    Atrial Rate 72 BPM    P-R Interval 156 ms    QRS Duration 102 ms    Q-T Interval 404 ms    QTc Calculation (Bazett) 442 ms    P Axis 49 degrees    R Axis -50 degrees    T Axis -48 degrees   Basic Metabolic Panel    Collection Time: 12/05/20  9:45 PM   Result Value Ref Range    Sodium 139 132 - 146 mmol/L    Potassium 4.2 3.5 - 5.0 mmol/L    Chloride 103 98 - 107 mmol/L    CO2 25 22 - 29 mmol/L    Anion Gap 11 7 - 16 mmol/L    Glucose 219 (H) 74 - 99 mg/dL    BUN 12 6 - 20 mg/dL    CREATININE 1.3 (H) 0.7 - 1.2 mg/dL    GFR Non-African American >60 >=60 mL/min/1.73    GFR African American >60     Calcium 9.0 8.6 - 10.2 mg/dL   Magnesium    Collection Time: 12/05/20  9:45 PM   Result Value Ref Range    Magnesium 2.0 1.6 - 2.6 mg/dL   CBC Auto Differential    Collection Time: 12/05/20  9:45 PM   Result Value Ref Range    WBC 5.4 4.5  - 11.5 E9/L    RBC 4.69 3.80 - 5.80 E12/L    Hemoglobin 14.2 12.5 - 16.5 g/dL    Hematocrit 67.8 93.8 - 54.0 %    MCV 86.6 80.0 - 99.9 fL    MCH 30.3 26.0 - 35.0 pg    MCHC 35.0 (H) 32.0 - 34.5 %    RDW 14.2 11.5 - 15.0 fL    Platelets 255 130 - 450 E9/L    MPV 9.0 7.0 - 12.0 fL    Neutrophils % 45.2 43.0 - 80.0 %    Immature Granulocytes % 0.2 0.0 - 5.0 %    Lymphocytes % 40.6 20.0 - 42.0 %    Monocytes % 8.5 2.0 - 12.0 %    Eosinophils % 4.2 0.0 - 6.0 %    Basophils % 1.3 0.0 - 2.0 %    Neutrophils Absolute 2.46 1.80 - 7.30 E9/L    Immature Granulocytes #  0.01 E9/L    Lymphocytes Absolute 2.21 1.50 - 4.00 E9/L    Monocytes Absolute 0.46 0.10 - 0.95 E9/L    Eosinophils Absolute 0.23 0.05 - 0.50 E9/L    Basophils Absolute 0.07 0.00 - 0.20 E9/L   Troponin    Collection Time: 12/05/20  9:45 PM   Result Value Ref Range    Troponin, High Sensitivity 7 0 - 11 ng/L   Brain Natriuretic Peptide    Collection Time: 12/05/20  9:45 PM   Result Value Ref Range    Pro-BNP 12 0 - 125 pg/mL   D-Dimer, Quantitative    Collection Time: 12/05/20  9:45 PM   Result Value Ref Range    D-Dimer, Quant 377 ng/mL DDU   COVID-19, Rapid    Collection Time: 12/05/20 10:00 PM    Specimen: Nasopharyngeal Swab   Result Value Ref Range    SARS-CoV-2, NAAT Not Detected Not Detected   Troponin    Collection Time: 12/05/20 11:08 PM   Result Value Ref Range    Troponin, High Sensitivity 8 0 - 11 ng/L       Imaging  XR CHEST PORTABLE    Result Date: 12/05/2020  EXAMINATION: ONE XRAY VIEW OF THE CHEST 12/05/2020 9:05 pm COMPARISON: None. HISTORY: ORDERING SYSTEM PROVIDED HISTORY: sob TECHNOLOGIST PROVIDED HISTORY: Reason for exam:->sob FINDINGS: The lungs are without acute focal process.  There is no effusion or pneumothorax. The cardiomediastinal silhouette is without acute process. The osseous structures are without acute process.     No acute process.     CTA PULMONARY W CONTRAST    Result Date: 12/05/2020  EXAMINATION: CTA OF THE CHEST 12/05/2020 11:01  pm TECHNIQUE: CTA of the chest was performed after the administration of intravenous contrast.  Multiplanar reformatted images are provided for review.  MIP images are provided for review. Dose modulation, iterative reconstruction, and/or weight based adjustment of the mA/kV was utilized to reduce the radiation dose to as low as reasonably achievable. COMPARISON: None. HISTORY: ORDERING SYSTEM PROVIDED HISTORY: Chest pain, shortness of breath, elevated D-dimer, concern for pulmonary embolism TECHNOLOGIST PROVIDED HISTORY: Reason for exam:->Chest pain, shortness of breath, elevated D-dimer, concern for pulmonary embolism Decision Support Exception - unselect if not a suspected or confirmed emergency medical condition->Emergency Medical Condition (MA) FINDINGS: Pulmonary Arteries: Pulmonary arteries are adequately opacified for evaluation.  No evidence of intraluminal filling defect to suggest pulmonary embolism.  Main pulmonary artery is normal in caliber. Mediastinum: No evidence of mediastinal lymphadenopathy.  The heart and pericardium demonstrate no acute abnormality.  There is no acute abnormality of the thoracic aorta. Lungs/pleura: The lungs are without acute process.  No focal consolidation or pulmonary edema.  No evidence of pleural effusion or pneumothorax. Upper Abdomen: Diffuse hepatic steatosis. Soft Tissues/Bones: Diffuse heterogeneous enlargement of the right lobe of the thyroid gland, recommend further evaluation with thyroid ultrasound.     No evidence of pulmonary embolism or acute pulmonary abnormality.           Assessment and Plan  Patient is a 49 y.o. male who presented with SOB.   The active problem list is as follows:    ?? Dyspnea on exertion- proBNP 12. D-dimer 377, CTA negative for PE. Possible ischemic evaluation await cardio input  ?? Chest discomfort- with abnormal EKG. Trops negative thus far.   ?? Hyperglycemia- check hemoglobin A1c.   ?? Asthma  ?? Obesity- BMI 35      ?? Routine labs in  the morning.  ??  DVT prophylaxis.  ?? Please see orders for further management and care.        Greer Pickerel, DO    8:56 AM  12/06/2020

## 2020-12-06 NOTE — Consults (Signed)
Inpatient [facility] Cardiology Consultation      Reason for Consult: Chest pain, abnormal EKG    Consulting Physician: Dr. Joen Laura    Requesting Physician: Dr. Audley Hose    Date of Consultation: 12/06/2020    HISTORY OF PRESENT ILLNESS:   Patient is a 49 year old AAM not previously known to [facility] Cardiology.  He is being seen in consultation this hospital admission by Dr. Joen Laura for evaluation and recommendations regarding chest pain and abnormal EKG.    Patient has a limited past medical history of obesity and asthma.  He denies ever being evaluated by a cardiologist in the past, and denies any prior cardiac testing including stress testing, echocardiogram or left heart catheterization.  He denies any personal history of diabetes mellitus, hypertension, hyperlipidemia, coronary artery disease, myocardial infarction, heart failure, valvular heart disease or cardiac arrhythmia.  He does not currently follow regularly with a primary care provider.    Patient presented to [facility] on December 05, 2020 with complaints of shortness of breath and chest discomfort.  Per the patient, approximately one week prior to Thanksgiving, he began to notice symptoms of sore throat, rhinorrhea, generalized fatigue, productive cough as well as new onset dyspnea on exertion.  The patient states he does not have chronic dyspnea at baseline despite his pulmonary disease.  He was seen at an urgent care around that time, and given treatment for bronchitis as an outpatient.  He states that his other symptoms resolved, but his dyspnea on exertion became persistent and eventually worsened to dyspnea at rest.  This past Sunday night, patient then noticed an episode of chest discomfort that was concerning to him.  The chest discomfort was located midsternally with no radiation.  He states it felt like a "muscle inflammation".  He noticed it with exertion, with the episode lasting approximately 30 minutes and resolving/improving with rest.  Denies any recurrence  of the chest discomfort.  Patient states that his dyspnea has not improved with his asthma treatments, including his inhaler.  He denies any complaints to me of nausea, emesis, diaphoresis, dizziness, palpitations, near-syncope or syncope.  He denies subjective fever or chills.  He denies paroxysmal nocturnal dyspnea, orthopnea or peripheral edema.  Family and social history as noted below.  Labs and diagnostic testing as noted below.        Please note: past medical records were reviewed per electronic medical record (EMR) - see detailed reports under Past Medical/ Surgical History.   PAST MEDICAL HISTORY:    1. Obesity.   2. Asthma.       PAST SURGICAL HISTORY:    History reviewed. No pertinent surgical history.    HOME MEDICATIONS:  Prior to Admission medications    Medication Sig Start Date End Date Taking? Authorizing Provider   ALBUTEROL SULFATE, SENSOR, IN Inhale into the lungs   Yes Historical Provider, MD       CURRENT MEDICATIONS:      Current Facility-Administered Medications:   ???  sodium chloride flush 0.9 % injection 5-40 mL, 5-40 mL, IntraVENous, 2 times per day, Fortunato Curling, DO  ???  sodium chloride flush 0.9 % injection 5-40 mL, 5-40 mL, IntraVENous, PRN, Fortunato Curling, DO  ???  0.9 % sodium chloride infusion, 25 mL, IntraVENous, PRN, Fortunato Curling, DO  ???  ondansetron (ZOFRAN-ODT) disintegrating tablet 4 mg, 4 mg, Oral, Q8H PRN **OR** ondansetron (ZOFRAN) injection 4 mg, 4 mg, IntraVENous, Q6H PRN, Fortunato Curling, DO  ???  acetaminophen (TYLENOL) tablet 650 mg, 650  mg, Oral, Q6H PRN **OR** acetaminophen (TYLENOL) suppository 650 mg, 650 mg, Rectal, Q6H PRN, Fortunato Curling, DO  ???  polyethylene glycol (GLYCOLAX) packet 17 g, 17 g, Oral, Daily PRN, Fortunato Curling, DO  ???  [START ON 12/07/2020] aspirin chewable tablet 81 mg, 81 mg, Oral, Daily, Fortunato Curling, DO  ???  atorvastatin (LIPITOR) tablet 40 mg, 40 mg, Oral, Nightly, Fortunato Curling, DO  ???  enoxaparin (LOVENOX) injection 40 mg, 40 mg, SubCUTAneous, Daily,  Fortunato Curling, DO  ???  sodium chloride flush 0.9 % injection 10 mL, 10 mL, IntraVENous, Once, Kermit Balo Ward, MD      ALLERGIES:  Shellfish-derived products    SOCIAL HISTORY:    Social alcohol use.  Denies former or current tobacco or illicit drug use.    FAMILY HISTORY:   Father passing away from myocardial infarction at age 21.  Denies any other pertinent cardiac family medical history me at this time.      REVIEW OF SYSTEMS:     ?? Constitutional: +generalized fatigue. Denies fevers, chills, night sweats. Denies significant weight loss or weight gain.  ?? HEENT: +sore throat, +rhinorrhea. Denies headaches, nose bleeds. Denies blurred vision. Denies dysphagia, odynophagia.  ?? Musculoskeletal: Denies falls, pain to BLE with ambulation. Denies muscle weakness.  ?? Neurological: Denies dizziness or lightheadedness, numbness and tingling. Denies focal neurological deficits.  ?? Cardiovascular: +CP. Denies palpitations, diaphoresis. Denies near syncope, syncope. Denies PND, orthopnea, peripheral edema.  ?? Respiratory: +SOB, +cough. Denies hemoptysis.  ?? Gastrointestinal: Denies abdominal pain, nausea/vomiting, diarrhea and constipation, black/bloody, and tarry stools.   ?? Genitourinary: Denies dysuria and hematuria.  ?? Hematologic: Denies excessive bruising or bleeding.  ?? Endocrine: Denies excessive thirst. Denies intolerance to hot and cold.    PHYSICAL EXAM:   BP (!) 167/98    Pulse 78    Temp 96.5 ??F (35.8 ??C) (Temporal)    Resp 16    Ht 6' 2.5" (1.892 m)    Wt 282 lb 3.2 oz (128 kg)    SpO2 98%    BMI 35.75 kg/m??   CONST:  Well developed, obese AAM who appears stated age. Awake, alert, cooperative, no apparent distress.  HEENT:   Head- Normocephalic, atraumatic.   Eyes- Conjunctivae pink, anicteric.  Neck-  No stridor, trachea midline, no apparent jugular venous distention.   CHEST: Chest symmetrical and non-tender to palpation. No accessory muscle use or intercostal retractions.  RESPIRATORY: Lung sounds - clear  throughout fields. No wheezing, rales or rhonchi.  CARDIOVASCULAR:     No noted carotid bruit.  Heart Ausculation- Regular rate and rhythm, no apparent murmur.   PV: No lower extremity edema. Pedal pulses palpable, no clubbing or cyanosis.   ABDOMEN: Soft, non-tender to light palpation. Bowel sounds present.   MS: Good muscle strength and tone. No atrophy or abnormal movements.   SKIN: Warm and dry. No statis dermatitis or ulcers.  NEURO / PSYCH: Oriented to person, place and time. Speech clear and appropriate. Follows all commands. Pleasant affect.      DATA:    Telemetry: NSR with HR in the 70s    Diagnostic:  All diagnostic testing and lab work thus far this admission reviewed in detail.    Chest CTA 12/05/2020:  Impression:  No evidence of pulmonary embolism or acute pulmonary abnormality.    CXR 12/05/2020:  Impression:  No acute process.      No intake or output data in the 24 hours ending 12/06/20 0736  Labs:   CBC:   Recent Labs     12/05/20  2145   WBC 5.4   HGB 14.2   HCT 40.6   PLT 255     BMP:   Recent Labs     12/05/20  2145   NA 139   K 4.2   CO2 25   BUN 12   CREATININE 1.3*   LABGLOM >60   CALCIUM 9.0     Mag:   Recent Labs     12/05/20  2145   MG 2.0      Ref Range & Units 12/05/20 2145   Troponin, High Sensitivity 0 - 11 ng/L 7      Ref Range & Units 12/05/20 2308    Troponin, High Sensitivity 0 - 11 ng/L 8       Ref Range & Units 12/05/20 2145   Pro-BNP 0 - 125 pg/mL 12       Ref Range & Units 12/05/20 2200   SARS-CoV-2, NAAT Not Detected Not Detected       Ref Range & Units 12/05/20 2145   D-Dimer, Quant ng/mL DDU 377        ASSESSMENT:  1. Chest pain.  hS-troponin 7 --> 8.  Chest CTA negative for pulmonary embolism.  Non-specific T wave changes in the inferoateral leads.  Currently chest pain-free.  2. Dyspnea on exertion.  Pulmonary etiology versus anginal equivalent.  3. History of asthma.  4. Acute kidney injury, improved.  5. Obesity.        RECOMMENDATIONS:  1. Coronary CTA for further  ischemic evaluation.  Coronary CTA will be performed tomorrow due to shellfish allergy and need for premedication.  2. Normal saline 1 L bolus over six hours given AKI and need for further use of contrast dye.  3. Agree with aspirin and statin therapy at this time.  4. Further recommendations to follow.        The above case and recommendations have been discussed and made in collaboration with Dr. Joen Laura.  Further recommendations to follow as per him.    Corey Harold, MMS, [facility]  [facility] Cardiology    Electronically signed by Corey Harold, PA-C on 12/06/2020 at 7:36 AM     PHYSICIAN ADDENDUM:  I independently reviewed the HPI, ROS, PMH, PSH, FMH, SH, and medications independently and agree with above documentation. Plan discussed with the patient/family/care team.      Kerby Nora, MD, [facility]  Interventional Cardiology/Structural Heart Disease  Office: [phone]  Coordinator: Peggye Pitt

## 2020-12-06 NOTE — Progress Notes (Signed)
Call put in via perfect serve for cardiology consult. Dr. Lilyan Punt on call.

## 2020-12-06 NOTE — Plan of Care (Signed)
Problem: Breathing Pattern - Ineffective:  Goal: Ability to achieve and maintain a regular respiratory rate will improve  Description: Ability to achieve and maintain a regular respiratory rate will improve  Outcome: Met This Shift

## 2020-12-06 NOTE — Progress Notes (Signed)
Spoke with Dr Betti Cruz. CTA will be done tomorrow due to needing 13 hour shellfish allergy protocol. Perfect served Dr Joen Laura.

## 2020-12-07 LAB — COMPREHENSIVE METABOLIC PANEL W/ REFLEX TO MG FOR LOW K
ALT: 35 U/L (ref 0–40)
AST: 21 U/L (ref 0–39)
Albumin: 4.3 g/dL (ref 3.5–5.2)
Alkaline Phosphatase: 68 U/L (ref 40–129)
Anion Gap: 9 mmol/L (ref 7–16)
BUN: 11 mg/dL (ref 6–20)
CO2: 25 mmol/L (ref 22–29)
Calcium: 9 mg/dL (ref 8.6–10.2)
Chloride: 102 mmol/L (ref 98–107)
Creatinine: 1.1 mg/dL (ref 0.7–1.2)
GFR African American: >60
GFR Non-African American: >60 mL/min/{1.73_m2} (ref >=60)
Glucose: 231 mg/dL — ABNORMAL HIGH (ref 74–99)
Potassium reflex Magnesium: 5.4 mmol/L — ABNORMAL HIGH (ref 3.5–5.0)
Sodium: 136 mmol/L (ref 132–146)
Total Bilirubin: 0.4 mg/dL (ref 0.0–1.2)
Total Protein: 7.4 g/dL (ref 6.4–8.3)

## 2020-12-07 LAB — EKG 12-LEAD
Atrial Rate: 70 {beats}/min
P Axis: 49 degrees
P-R Interval: 160 ms
Q-T Interval: 430 ms
QRS Duration: 104 ms
QTc Calculation (Bazett): 464 ms
R Axis: -51 degrees
T Axis: -20 degrees
Ventricular Rate: 70 {beats}/min

## 2020-12-07 LAB — CBC
Hematocrit: 44.4 % (ref 37.0–54.0)
Hemoglobin: 14.7 g/dL (ref 12.5–16.5)
MCH: 28.5 pg (ref 26.0–35.0)
MCHC: 33.1 % (ref 32.0–34.5)
MCV: 86 fL (ref 80.0–99.9)
MPV: 8.8 fL (ref 7.0–12.0)
Platelets: 254 E9/L (ref 130–450)
RBC: 5.16 E12/L (ref 3.80–5.80)
RDW: 14.2 fL (ref 11.5–15.0)
WBC: 5.8 E9/L (ref 4.5–11.5)

## 2020-12-07 MED ORDER — METOPROLOL TARTRATE 50 MG PO TABS
50 MG | Freq: Once | ORAL | Status: AC
Start: 2020-12-07 — End: 2020-12-07
  Administered 2020-12-07: 15:00:00 150 mg via ORAL

## 2020-12-07 MED ORDER — DILTIAZEM HCL 25 MG/5ML IV SOLN
255 MG/5ML | Freq: Once | INTRAVENOUS | Status: AC
Start: 2020-12-07 — End: 2020-12-07
  Administered 2020-12-07: 18:00:00 10 mg via INTRAVENOUS

## 2020-12-07 MED ORDER — METOPROLOL TARTRATE 50 MG PO TABS
50 MG | Freq: Once | ORAL | Status: AC
Start: 2020-12-07 — End: 2020-12-07
  Administered 2020-12-07: 16:00:00 100 mg via ORAL

## 2020-12-07 MED ORDER — SODIUM CHLORIDE 0.9 % IV BOLUS
0.9 % | Freq: Once | INTRAVENOUS | Status: AC
Start: 2020-12-07 — End: 2020-12-07
  Administered 2020-12-07: 15:00:00 1000 mL via INTRAVENOUS

## 2020-12-07 MED FILL — METOPROLOL TARTRATE 50 MG PO TABS: 50 mg | ORAL | Qty: 3

## 2020-12-07 MED FILL — NORMAL SALINE FLUSH 0.9 % IV SOLN: 0.9 % | INTRAVENOUS | Qty: 20

## 2020-12-07 MED FILL — ASPIRIN 81 MG PO CHEW: 81 mg | ORAL | Qty: 1

## 2020-12-07 MED FILL — BANOPHEN 25 MG PO TABS: 25 mg | ORAL | Qty: 2

## 2020-12-07 MED FILL — DILTIAZEM HCL 25 MG/5ML IV SOLN: 25 MG/5ML | INTRAVENOUS | Qty: 5

## 2020-12-07 MED FILL — ENOXAPARIN SODIUM 40 MG/0.4ML SC SOLN: 40 MG/0.4ML | SUBCUTANEOUS | Qty: 0.4

## 2020-12-07 MED FILL — METOPROLOL TARTRATE 50 MG PO TABS: 50 mg | ORAL | Qty: 2

## 2020-12-07 MED FILL — ATORVASTATIN CALCIUM 40 MG PO TABS: 40 mg | ORAL | Qty: 1

## 2020-12-07 MED FILL — METOPROLOL TARTRATE 50 MG PO TABS: 50 mg | ORAL | Qty: 1

## 2020-12-07 NOTE — Progress Notes (Addendum)
Coronary CTA unable to be completed due to inability to get HR low enough. Discussed with Dr. Joen Laura. Will cancel CTA and plan exercise stress test tomorrow. Orders placed.

## 2020-12-07 NOTE — Progress Notes (Signed)
Unable to get heart rate below 70's, despite mx attempts per Dr Reddy's orders for CTA. Dr Betti Cruz aware and is to speak to attending for further orders.

## 2020-12-07 NOTE — Care Coordination-Inpatient (Signed)
Care Coordination:  Patient remains in [place] . Pending CTA today, procedure being held due to increased heart rate.  SW met with patient and fiance at bedside.  Patient lives with fiance.  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  Fiance works from home.  Patient is establishing primary care at the  [facility] on [facility]  First appointment pending.    Discharge plan is to return home. Will follow .  No needs anticipated at this time.

## 2020-12-07 NOTE — Telephone Encounter (Signed)
Pt still in hospital, will call when they need to schedule.

## 2020-12-07 NOTE — Plan of Care (Signed)
Problem: Breathing Pattern - Ineffective:  Goal: Ability to achieve and maintain a regular respiratory rate will improve  Description: Ability to achieve and maintain a regular respiratory rate will improve  Outcome: Met This Shift

## 2020-12-07 NOTE — Progress Notes (Signed)
Hospitalist Progress Note      SYNOPSIS: Patient admitted on 12/05/2020  presented to the ER with complaint of shortness of breath over the past 2 weeks.  Shortness of breath is worsened with exertion such as climbing the stairs.  He went to urgent care prior and was treated for bronchitis with azithromycin, albuterol, prednisone.  He was Covid negative at that time.  He does state that he went on a 6-hour car trip for Thanksgiving.  He has not had any improvement of his shortness of breath symptoms.  He does not necessarily admit to chest pain but admits to "inflammation" in his chest.  He states that he placed ice pack to the chest wall for this.  He denies ever being a smoker denies any COPD.  He does have an asthma history.  In the ER he was found to have abnormal EKG.  He states his last stress test was in 2019 which he states was normal.  ??  CTA of the chest was normal without any evidence of PE.  Troponins have been negative    SUBJECTIVE:  Stable overnight. No other overnight issues reported.   Patient seen and examined  Records reviewed.   For CTA coronaries  No acute complaints      Temp (24hrs), Avg:97.3 ??F (36.3 ??C), Min:97.1 ??F (36.2 ??C), Max:97.5 ??F (36.4 ??C)    DIET: Diet NPO  CODE: Full Code    Intake/Output Summary (Last 24 hours) at 12/07/2020 0936  Last data filed at 12/07/2020 0445  Gross per 24 hour   Intake 105 ml   Output 1450 ml   Net -1345 ml       Review of Systems  All bolded are positive; please see HPI  General:  Fever, chills, diaphoresis, fatigue, malaise, night sweats, weight loss  Psychological:  Anxiety, disorientation, hallucinations.  ENT:  Epistaxis, headaches, vertigo, visual changes.  Cardiovascular:  Chest pain, irregular heartbeats, palpitations, paroxysmal nocturnal dyspnea.  Respiratory:  Shortness of breath, coughing, sputum production, hemoptysis, wheezing, orthopnea.  Gastrointestinal:  Nausea, vomiting, diarrhea, heartburn, constipation, abdominal pain, hematemesis,  hematochezia, melena, acholic stools  Genito-Urinary:  Dysuria, urgency, frequency, hematuria  Musculoskeletal:  Joint pain, joint stiffness, joint swelling, muscle pain  Neurology:  Headache, focal neurological deficits, weakness, numbness, paresthesia  Derm:  Rashes, ulcers, excoriations, bruising  Extremities:  Decreased ROM, peripheral edema, mottling      OBJECTIVE:    BP (!) 145/77    Pulse 107    Temp 97.4 ??F (36.3 ??C) (Temporal)    Resp 16    Ht 6' 2.5" (1.892 m)    Wt 282 lb 4.8 oz (128.1 kg)    SpO2 97%    BMI 35.76 kg/m??     General appearance:  awake, alert, and oriented to person, place, time, and purpose; appears stated age and cooperative; no apparent distress no labored breathing  HEENT:  Conjunctivae/corneas clear.   Neck: Supple. No jugular venous distention.   Respiratory: symmetrical; clear to auscultation bilaterally; no wheezes; no rhonchi; no rales  Cardiovascular: rhythm regular; rate controlled; no murmurs  Abdomen: Soft, nontender, nondistended  Extremities:  peripheral pulses present; no peripheral edema; no ulcers  Musculoskeletal: No clubbing, cyanosis, no bilateral lower extremity edema. Brisk capillary refill.   Skin:  No rashes  on visible skin  Neurologic: awake, alert and following commands     ASSESSMENT and PLAN:  ?? Dyspnea on exertion- proBNP 12. D-dimer 377, CTA negative for PE. Awaiting Coronary CTA. Aspirin/statin.  ??  Chest discomfort- with abnormal EKG. Trops negative thus far. Awaiting coronary CTA   ?? Hyperglycemia-   hemoglobin A1c. 7.3 start metformin after 48 hours post IV dye  ?? Asthma  ?? Obesity- BMI 35           DISPOSITION: Continue current plan of care    Medications:  REVIEWED DAILY    Infusion Medications   ??? sodium chloride       Scheduled Medications   ??? sodium chloride  1,000 mL IntraVENous Once   ??? sodium chloride flush  5-40 mL IntraVENous 2 times per day   ??? aspirin  81 mg Oral Daily   ??? atorvastatin  40 mg Oral Nightly   ??? enoxaparin  40 mg SubCUTAneous  Daily   ??? nitroGLYCERIN  0.8 mg SubLINGual Once   ??? nitroGLYCERIN  0.4 mg SubLINGual Once   ??? sodium chloride flush  10 mL IntraVENous Once     PRN Meds: sodium chloride flush, sodium chloride, ondansetron **OR** ondansetron, acetaminophen **OR** acetaminophen, polyethylene glycol, albuterol, metoprolol    Labs:     Recent Labs     12/05/20  2145 12/06/20  1126 12/07/20  0442   WBC 5.4 5.1 5.8   HGB 14.2 13.7 14.7   HCT 40.6 41.8 44.4   PLT 255 258 254       Recent Labs     12/05/20  2145 12/06/20  1126 12/07/20  0442   NA 139 138 136   K 4.2 4.0 5.4*   CL 103 103 102   CO2 25 25 25    BUN 12 11 11    CREATININE 1.3* 1.0 1.1   CALCIUM 9.0 9.0 9.0       Recent Labs     12/07/20  0442   PROT 7.4   ALKPHOS 68   ALT 35   AST 21   BILITOT 0.4       No results for input(s): INR in the last 72 hours.    No results for input(s): CKTOTAL, TROPONINI in the last 72 hours.    Chronic labs:    Lab Results   Component Value Date    CHOL 220 (H) 12/06/2020    TRIG 334 (H) 12/06/2020    HDL 33 12/06/2020    LDLCALC 120 (H) 12/06/2020    LABA1C 7.3 (H) 12/06/2020       Radiology: REVIEWED DAILY    +++++++++++++++++++++++++++++++++++++++++++++++++  14/06/2020, DO  [employer] - Hospitalist  [place] - [facility], [place]  +++++++++++++++++++++++++++++++++++++++++++++++++  NOTE: This report was transcribed using voice recognition software. Every effort was made to ensure accuracy; however, inadvertent computerized transcription errors may be present.

## 2020-12-08 ENCOUNTER — Ambulatory Visit: Admit: 2020-12-08 | Payer: PRIVATE HEALTH INSURANCE | Primary: Family Medicine

## 2020-12-08 ENCOUNTER — Observation Stay: Admit: 2020-12-08 | Discharge: 2020-12-12 | Payer: PRIVATE HEALTH INSURANCE | Primary: Family Medicine

## 2020-12-08 LAB — BASIC METABOLIC PANEL
Anion Gap: 12 mmol/L (ref 7–16)
BUN: 15 mg/dL (ref 6–20)
CO2: 22 mmol/L (ref 22–29)
Calcium: 9.2 mg/dL (ref 8.6–10.2)
Chloride: 103 mmol/L (ref 98–107)
Creatinine: 1.1 mg/dL (ref 0.7–1.2)
GFR African American: >60
GFR Non-African American: >60 mL/min/{1.73_m2} (ref >=60)
Glucose: 180 mg/dL — ABNORMAL HIGH (ref 74–99)
Potassium: 4.2 mmol/L (ref 3.5–5.0)
Sodium: 137 mmol/L (ref 132–146)

## 2020-12-08 LAB — COMPREHENSIVE METABOLIC PANEL
ALT: 28 U/L (ref 0–40)
AST: 14 U/L (ref 0–39)
Albumin: 3.7 g/dL (ref 3.5–5.2)
Alkaline Phosphatase: 63 U/L (ref 40–129)
Anion Gap: 13 mmol/L (ref 7–16)
BUN: 15 mg/dL (ref 6–20)
CO2: 22 mmol/L (ref 22–29)
Calcium: 8.6 mg/dL (ref 8.6–10.2)
Chloride: 104 mmol/L (ref 98–107)
Creatinine: 1 mg/dL (ref 0.7–1.2)
GFR African American: >60
GFR Non-African American: >60 mL/min/{1.73_m2} (ref >=60)
Glucose: 196 mg/dL — ABNORMAL HIGH (ref 74–99)
Potassium: 3.8 mmol/L (ref 3.5–5.0)
Sodium: 139 mmol/L (ref 132–146)
Total Bilirubin: 0.3 mg/dL (ref 0.0–1.2)
Total Protein: 7.2 g/dL (ref 6.4–8.3)

## 2020-12-08 LAB — CBC
Hematocrit: 43.7 % (ref 37.0–54.0)
Hematocrit: 44.2 % (ref 37.0–54.0)
Hemoglobin: 14.3 g/dL (ref 12.5–16.5)
Hemoglobin: 14.5 g/dL (ref 12.5–16.5)
MCH: 29.2 pg (ref 26.0–35.0)
MCH: 29.1 pg (ref 26.0–35.0)
MCHC: 32.7 % (ref 32.0–34.5)
MCHC: 32.8 % (ref 32.0–34.5)
MCV: 89.2 fL (ref 80.0–99.9)
MCV: 88.6 fL (ref 80.0–99.9)
MPV: 9.2 fL (ref 7.0–12.0)
MPV: 8.9 fL (ref 7.0–12.0)
Platelets: 285 E9/L (ref 130–450)
Platelets: 282 E9/L (ref 130–450)
RBC: 4.9 E12/L (ref 3.80–5.80)
RBC: 4.99 E12/L (ref 3.80–5.80)
RDW: 14.6 fL (ref 11.5–15.0)
RDW: 14.6 fL (ref 11.5–15.0)
WBC: 12.1 E9/L — ABNORMAL HIGH (ref 4.5–11.5)
WBC: 9.9 E9/L (ref 4.5–11.5)

## 2020-12-08 LAB — APTT: aPTT: 30.5 s (ref 24.5–35.1)

## 2020-12-08 LAB — NM MYOCARDIAL SPECT REST EXERCISE OR RX: Left Ventricular Ejection Fraction: 57

## 2020-12-08 MED ORDER — TECHNETIUM TC 99M SESTAMIBI - CARDIOLITE
Freq: Once | Status: AC | PRN
Start: 2020-12-08 — End: 2020-12-08
  Administered 2020-12-08: 14:00:00 12 via INTRAVENOUS

## 2020-12-08 MED ORDER — DIGOXIN 0.25 MG/ML IJ SOLN
0.25 MG/ML | Freq: Once | INTRAMUSCULAR | Status: AC
Start: 2020-12-08 — End: 2020-12-08
  Administered 2020-12-08: 17:00:00 250 ug via INTRAVENOUS

## 2020-12-08 MED ORDER — ENOXAPARIN SODIUM 120 MG/0.8ML SC SOLN
120 MG/0.8ML | Freq: Two times a day (BID) | SUBCUTANEOUS | Status: DC
Start: 2020-12-08 — End: 2020-12-09
  Administered 2020-12-08 – 2020-12-09 (×3): 120 mg/kg via SUBCUTANEOUS

## 2020-12-08 MED ORDER — DIGOXIN 0.25 MG/ML IJ SOLN
0.25 MG/ML | Freq: Four times a day (QID) | INTRAMUSCULAR | Status: DC
Start: 2020-12-08 — End: 2020-12-08
  Administered 2020-12-08: 21:00:00 250 ug via INTRAVENOUS

## 2020-12-08 MED ORDER — HEPARIN SODIUM (PORCINE) 1000 UNIT/ML IJ SOLN
1000 UNIT/ML | INTRAMUSCULAR | Status: DC | PRN
Start: 2020-12-08 — End: 2020-12-09

## 2020-12-08 MED ORDER — HEPARIN SODIUM (PORCINE) 1000 UNIT/ML IJ SOLN
1000 UNIT/ML | Freq: Once | INTRAMUSCULAR | Status: DC
Start: 2020-12-08 — End: 2020-12-08

## 2020-12-08 MED ORDER — METOPROLOL SUCCINATE ER 25 MG PO TB24
25 MG | Freq: Two times a day (BID) | ORAL | Status: DC
Start: 2020-12-08 — End: 2020-12-09
  Administered 2020-12-09: 02:00:00 25 mg via ORAL

## 2020-12-08 MED ORDER — DIGOXIN 0.25 MG/ML IJ SOLN
0.25 MG/ML | Freq: Four times a day (QID) | INTRAMUSCULAR | Status: DC
Start: 2020-12-08 — End: 2020-12-08

## 2020-12-08 MED ORDER — TECHNETIUM TC 99M SESTAMIBI - CARDIOLITE
Freq: Once | Status: AC | PRN
Start: 2020-12-08 — End: 2020-12-08
  Administered 2020-12-08: 16:00:00 35 via INTRAVENOUS

## 2020-12-08 MED ORDER — SODIUM CHLORIDE 0.9 % IV SOLN
0.9 % | Freq: Once | INTRAVENOUS | Status: AC
Start: 2020-12-08 — End: 2020-12-08
  Administered 2020-12-08: 17:00:00 via INTRAVENOUS

## 2020-12-08 MED ORDER — HEPARIN SOD (PORCINE) IN D5W 100 UNIT/ML IV SOLN
100 UNIT/ML | INTRAVENOUS | Status: DC
Start: 2020-12-08 — End: 2020-12-08

## 2020-12-08 MED FILL — ENOXAPARIN SODIUM 120 MG/0.8ML SC SOLN: 120 MG/0.8ML | SUBCUTANEOUS | Qty: 0.8

## 2020-12-08 MED FILL — ASPIRIN 81 MG PO CHEW: 81 mg | ORAL | Qty: 1

## 2020-12-08 MED FILL — DIGOXIN 0.25 MG/ML IJ SOLN: 0.25 mg/mL | INTRAMUSCULAR | Qty: 2

## 2020-12-08 MED FILL — ATORVASTATIN CALCIUM 40 MG PO TABS: 40 mg | ORAL | Qty: 1

## 2020-12-08 MED FILL — NORMAL SALINE FLUSH 0.9 % IV SOLN: 0.9 % | INTRAVENOUS | Qty: 10

## 2020-12-08 MED FILL — PAIN & FEVER 325 MG PO TABS: 325 mg | ORAL | Qty: 2

## 2020-12-08 NOTE — Progress Notes (Signed)
Dr. Ladoris Gene notified of pt converting to sinus rhythm.  Hold lanoxin iv.

## 2020-12-08 NOTE — Procedures (Addendum)
Exercise Nuclear Stress Test:    Cardiologist: Dr. Sheran Spine, MD    Date: 12/08/2020    Indications for study: Chest pain    1. No chest pain  2. Exercise time: 9:31, MPHR: 81%  3. No new arrhythmias during exercise portion of test --> new onset atrial fibrillation with RVR during recovery phase  4. No EKG changes suggestive of stress induced ischemia  5. Nuclear images pending    Thomas Kaufmann, MD  [facility] Cardiology

## 2020-12-08 NOTE — Progress Notes (Signed)
Hospitalist Progress Note      SYNOPSIS: Patient admitted on 12/05/2020  presented to the ER with complaint of shortness of breath over the past 2 weeks.  Shortness of breath is worsened with exertion such as climbing the stairs.  He went to urgent care prior and was treated for bronchitis with azithromycin, albuterol, prednisone.  He was Covid negative at that time.  He does state that he went on a 6-hour car trip for Thanksgiving.  He has not had any improvement of his shortness of breath symptoms.  He does not necessarily admit to chest pain but admits to "inflammation" in his chest.  He states that he placed ice pack to the chest wall for this.  He denies ever being a smoker denies any COPD.  He does have an asthma history.  In the ER he was found to have abnormal EKG.  He states his last stress test was in 2019 which he states was normal.  ??  CTA of the chest was normal without any evidence of PE.  Troponins have been negative    SUBJECTIVE:  Stable overnight. No other overnight issues reported.   Patient seen and examined  Records reviewed.   Was for coronary CTA yesterday but HR was not able to be low enough for testing  Underwent stress test today and went into Afib RVR  He states that the shortness of breath that he had prior to RVR was different than this feeling  He does have asthma and has a rescue inhaler      Temp (24hrs), Avg:97.6 ??F (36.4 ??C), Min:97.2 ??F (36.2 ??C), Max:97.8 ??F (36.6 ??C)    DIET: ADULT DIET; Regular; Low Fat/Low Chol/High Fiber/NAS  Diet NPO  CODE: Full Code    Intake/Output Summary (Last 24 hours) at 12/08/2020 1357  Last data filed at 12/08/2020 1159  Gross per 24 hour   Intake 560 ml   Output --   Net 560 ml       Review of Systems  All bolded are positive; please see HPI  General:  Fever, chills, diaphoresis, fatigue, malaise, night sweats, weight loss  Psychological:  Anxiety, disorientation, hallucinations.  ENT:  Epistaxis, headaches, vertigo, visual changes.  Cardiovascular:   Chest pain, irregular heartbeats, palpitations, paroxysmal nocturnal dyspnea.  Respiratory:  Shortness of breath, coughing, sputum production, hemoptysis, wheezing, orthopnea.  Gastrointestinal:  Nausea, vomiting, diarrhea, heartburn, constipation, abdominal pain, hematemesis, hematochezia, melena, acholic stools  Genito-Urinary:  Dysuria, urgency, frequency, hematuria  Musculoskeletal:  Joint pain, joint stiffness, joint swelling, muscle pain  Neurology:  Headache, focal neurological deficits, weakness, numbness, paresthesia  Derm:  Rashes, ulcers, excoriations, bruising  Extremities:  Decreased ROM, peripheral edema, mottling      OBJECTIVE:    BP 92/68    Pulse 142    Temp 97.8 ??F (36.6 ??C) (Oral)    Resp 16    Ht 6' 2.5" (1.892 m)    Wt 280 lb 12.8 oz (127.4 kg)    SpO2 100%    BMI 35.57 kg/m??     General appearance:  awake, alert, and oriented to person, place, time, and purpose; appears stated age and cooperative; no apparent distress no labored breathing  HEENT:  Conjunctivae/corneas clear.   Neck: Supple. No jugular venous distention.   Respiratory: symmetrical; clear to auscultation bilaterally; no wheezes; no rhonchi; no rales  Cardiovascular: rhythm regular; rate controlled; no murmurs  Abdomen: Soft, nontender, nondistended  Extremities:  peripheral pulses present; no peripheral edema; no ulcers  Musculoskeletal: No clubbing, cyanosis, no bilateral lower extremity edema. Brisk capillary refill.   Skin:  No rashes  on visible skin  Neurologic: awake, alert and following commands     ASSESSMENT and PLAN:  ?? Dyspnea on exertion- proBNP 12. D-dimer 377, CTA negative for PE. Aspirin/statin. History of asthma. He states shortness of breath he was having prior to admission feels different than how he feels now while in RVR. EKG with pulmonary disease pattern. Will consult pulm  ?? Chest discomfort- with abnormal EKG. Trops negative thus far. [place] to obtain coronary CTA due to HR. Underwent stress test today  which was normal however he went into afib RVR.  ?? New onset afib RVR- started on digoxin and lovenox, patient reports he is for possible cardioversion tomorrow.  ?? Hyperglycemia-   hemoglobin A1c. 7.3 start metformin after 48 hours post IV dye  ?? Asthma  ?? Obesity- BMI 35           DISPOSITION: Continue current plan of care    Medications:  REVIEWED DAILY    Infusion Medications   ??? sodium chloride       Scheduled Medications   ??? digoxin  250 mcg IntraVENous Q6H   ??? enoxaparin  1 mg/kg SubCUTAneous BID   ??? sodium chloride flush  5-40 mL IntraVENous 2 times per day   ??? aspirin  81 mg Oral Daily   ??? atorvastatin  40 mg Oral Nightly   ??? sodium chloride flush  10 mL IntraVENous Once     PRN Meds: heparin (porcine), heparin (porcine), sodium chloride flush, sodium chloride, ondansetron **OR** ondansetron, acetaminophen **OR** acetaminophen, polyethylene glycol, albuterol    Labs:     Recent Labs     12/06/20  1126 12/07/20  0442 12/08/20  0524   WBC 5.1 5.8 12.1*   HGB 13.7 14.7 14.3   HCT 41.8 44.4 43.7   PLT 258 254 285       Recent Labs     12/07/20  0442 12/07/20  2040 12/08/20  0524   NA 136 137 139   K 5.4* 4.2 3.8   CL 102 103 104   CO2 25 22 22    BUN 11 15 15    CREATININE 1.1 1.1 1.0   CALCIUM 9.0 9.2 8.6       Recent Labs     12/07/20  0442 12/08/20  0524   PROT 7.4 7.2   ALKPHOS 68 63   ALT 35 28   AST 21 14   BILITOT 0.4 0.3       No results for input(s): INR in the last 72 hours.    No results for input(s): CKTOTAL, TROPONINI in the last 72 hours.    Chronic labs:    Lab Results   Component Value Date    CHOL 220 (H) 12/06/2020    TRIG 334 (H) 12/06/2020    HDL 33 12/06/2020    LDLCALC 120 (H) 12/06/2020    LABA1C 7.3 (H) 12/06/2020       Radiology: REVIEWED DAILY    +++++++++++++++++++++++++++++++++++++++++++++++++  14/06/2020, DO  [employer] - Hospitalist  [place] - [facility], [place]  +++++++++++++++++++++++++++++++++++++++++++++++++  NOTE: This report was  transcribed using voice recognition software. Every effort was made to ensure accuracy; however, inadvertent computerized transcription errors may be present.

## 2020-12-09 ENCOUNTER — Ambulatory Visit: Payer: PRIVATE HEALTH INSURANCE | Attending: Registered Nurse | Primary: Family Medicine

## 2020-12-09 LAB — COMPREHENSIVE METABOLIC PANEL
ALT: 26 U/L (ref 0–40)
AST: 14 U/L (ref 0–39)
Albumin: 3.8 g/dL (ref 3.5–5.2)
Alkaline Phosphatase: 61 U/L (ref 40–129)
Anion Gap: 5 mmol/L — ABNORMAL LOW (ref 7–16)
BUN: 15 mg/dL (ref 6–20)
CO2: 28 mmol/L (ref 22–29)
Calcium: 8.7 mg/dL (ref 8.6–10.2)
Chloride: 102 mmol/L (ref 98–107)
Creatinine: 1.2 mg/dL (ref 0.7–1.2)
GFR African American: >60
GFR Non-African American: >60 mL/min/{1.73_m2} (ref >=60)
Glucose: 148 mg/dL — ABNORMAL HIGH (ref 74–99)
Potassium: 4.3 mmol/L (ref 3.5–5.0)
Sodium: 135 mmol/L (ref 132–146)
Total Bilirubin: 0.4 mg/dL (ref 0.0–1.2)
Total Protein: 7.4 g/dL (ref 6.4–8.3)

## 2020-12-09 LAB — EKG 12-LEAD
Atrial Rate: 62 {beats}/min
P Axis: 28 degrees
P-R Interval: 158 ms
Q-T Interval: 430 ms
QRS Duration: 98 ms
QTc Calculation (Bazett): 436 ms
R Axis: -45 degrees
T Axis: -19 degrees
Ventricular Rate: 62 {beats}/min

## 2020-12-09 LAB — CBC
Hematocrit: 43.6 % (ref 37.0–54.0)
Hemoglobin: 14.5 g/dL (ref 12.5–16.5)
MCH: 29.4 pg (ref 26.0–35.0)
MCHC: 33.3 % (ref 32.0–34.5)
MCV: 88.3 fL (ref 80.0–99.9)
MPV: 8.8 fL (ref 7.0–12.0)
Platelets: 263 E9/L (ref 130–450)
RBC: 4.94 E12/L (ref 3.80–5.80)
RDW: 14.6 fL (ref 11.5–15.0)
WBC: 6.9 E9/L (ref 4.5–11.5)

## 2020-12-09 LAB — APTT
aPTT: 39.4 s — ABNORMAL HIGH (ref 24.5–35.1)
aPTT: 42.8 s — ABNORMAL HIGH (ref 24.5–35.1)
aPTT: 38 s — ABNORMAL HIGH (ref 24.5–35.1)
aPTT: 34.9 s (ref 24.5–35.1)

## 2020-12-09 MED ORDER — METOPROLOL SUCCINATE ER 50 MG PO TB24
50 MG | Freq: Two times a day (BID) | ORAL | Status: DC
Start: 2020-12-09 — End: 2020-12-12
  Administered 2020-12-09 – 2020-12-12 (×7): 50 mg via ORAL

## 2020-12-09 MED FILL — PAIN & FEVER 325 MG PO TABS: 325 mg | ORAL | Qty: 2

## 2020-12-09 MED FILL — ENOXAPARIN SODIUM 120 MG/0.8ML SC SOLN: 120 MG/0.8ML | SUBCUTANEOUS | Qty: 0.8

## 2020-12-09 MED FILL — ATORVASTATIN CALCIUM 40 MG PO TABS: 40 mg | ORAL | Qty: 1

## 2020-12-09 MED FILL — NORMAL SALINE FLUSH 0.9 % IV SOLN: 0.9 % | INTRAVENOUS | Qty: 10

## 2020-12-09 MED FILL — NORMAL SALINE FLUSH 0.9 % IV SOLN: 0.9 % | INTRAVENOUS | Qty: 20

## 2020-12-09 MED FILL — ASPIRIN 81 MG PO CHEW: 81 mg | ORAL | Qty: 1

## 2020-12-09 MED FILL — METOPROLOL SUCCINATE ER 50 MG PO TB24: 50 mg | ORAL | Qty: 1

## 2020-12-09 MED FILL — METOPROLOL SUCCINATE ER 25 MG PO TB24: 25 mg | ORAL | Qty: 1

## 2020-12-09 NOTE — Progress Notes (Signed)
Cardiology Progress Note:    Narrative:  ?? 49 year old African-American male who is overweight and has asthma but otherwise has no significant history.  He presented with dyspnea on exertion and palpitations.  ?? Coronary CTA was attempted but could not be performed due to inability to achieve a low heart rate  ?? Exercise stress MPI was performed: 9 minutes 31 seconds on Bruce protocol, 81% MPHR.  No EKG changes or symptoms of ischemia during stress.  MPI was normal.  Patient went into A. fib with RVR  ?? Patient spontaneously converted to sinus rhythm overnight.  No complaints this morning      Objective:  BP (!) 143/82    Pulse 80    Temp 98 ??F (36.7 ??C) (Temporal)    Resp 16    Ht 6' 2.5" (1.892 m)    Wt 280 lb 9.6 oz (127.3 kg)    SpO2 97%    BMI 35.54 kg/m??    General: NAD  Lungs: CTAB  Heart: RRR. No M/R/G  Abdomen: soft, NT/ND  Extremities: no pitting edema. Warm.   Neuro: A&Ox3, MAEx4    Assessment:  1. Dyspnea on exertion.  Unable to achieve target heart rate on exercise stress MPI  2. Paroxysmal AF with RVR.  CHA2DS2-VASc score 0  3. Shellfish allergy - anaphylaxis    Recommendations:  ?? Discussed with the patient that while his stress test was low risk it was technically not diagnostic due to inability to achieve target heart rate.  I discussed with him the options of outpatient versus inpatient coronary angiography as he is fairly concerned about his symptoms.  He elected to stay over the weekend and undergo coronary angiogram on Monday  ?? Continue metoprolol succinate 50 mg p.o. twice daily.  ?? Anticoagulation for his PAF is not indicated given CHA2DS2-VASc score of 0  ?? Follow-up with me after discharge in 4 weeks        We will follow peripherally over the weekend.Kerby Nora, MD, [facility]  Interventional Cardiology/Structural Heart Disease  Office: [phone]

## 2020-12-09 NOTE — Progress Notes (Signed)
Hospitalist Progress Note      SYNOPSIS: Patient admitted on 12/05/2020  presented to the ER with complaint of shortness of breath over the past 2 weeks.  Shortness of breath is worsened with exertion such as climbing the stairs.  He went to urgent care prior and was treated for bronchitis with azithromycin, albuterol, prednisone.  He was Covid negative at that time.  He does state that he went on a 6-hour car trip for Thanksgiving.  He has not had any improvement of his shortness of breath symptoms.  He does not necessarily admit to chest pain but admits to "inflammation" in his chest.  He states that he placed ice pack to the chest wall for this.  He denies ever being a smoker denies any COPD.  He does have an asthma history.  In the ER he was found to have abnormal EKG.  He states his last stress test was in 2019 which he states was normal.  ??  CTA of the chest was normal without any evidence of PE.  Troponins have been negative  Was for coronary CTA yesterday but HR was not able to be low enough for testing  Underwent stress test 12/9 and went into Afib RVR  Initially plan was for cardioversion but he went back into NSR      SUBJECTIVE:  Stable overnight. No other overnight issues reported.   Patient seen and examined  Records reviewed.   Was for coronary CTA yesterday but HR was not able to be low enough for testing  Underwent stress test 12/9 and went into Afib RVR  He states that the shortness of breath that he had prior to RVR was different than this feeling  Initially plan was for cardioversion but he went back into NSR  Plan is for heart cath monday        Temp (24hrs), Avg:98 ??F (36.7 ??C), Min:97.3 ??F (36.3 ??C), Max:98.3 ??F (36.8 ??C)    DIET: ADULT DIET; Regular  CODE: Full Code    Intake/Output Summary (Last 24 hours) at 12/09/2020 1308  Last data filed at 12/09/2020 0300  Gross per 24 hour   Intake --   Output 450 ml   Net -450 ml       Review of Systems  All bolded are positive; please see  HPI  General:  Fever, chills, diaphoresis, fatigue, malaise, night sweats, weight loss  Psychological:  Anxiety, disorientation, hallucinations.  ENT:  Epistaxis, headaches, vertigo, visual changes.  Cardiovascular:  Chest pain, irregular heartbeats, palpitations, paroxysmal nocturnal dyspnea.  Respiratory:  Shortness of breath, coughing, sputum production, hemoptysis, wheezing, orthopnea.  Gastrointestinal:  Nausea, vomiting, diarrhea, heartburn, constipation, abdominal pain, hematemesis, hematochezia, melena, acholic stools  Genito-Urinary:  Dysuria, urgency, frequency, hematuria  Musculoskeletal:  Joint pain, joint stiffness, joint swelling, muscle pain  Neurology:  Headache, focal neurological deficits, weakness, numbness, paresthesia  Derm:  Rashes, ulcers, excoriations, bruising  Extremities:  Decreased ROM, peripheral edema, mottling      OBJECTIVE:    BP (!) 143/82    Pulse 80    Temp 98 ??F (36.7 ??C) (Temporal)    Resp 16    Ht 6' 2.5" (1.892 m)    Wt 280 lb 9.6 oz (127.3 kg)    SpO2 97%    BMI 35.54 kg/m??     General appearance:  awake, alert, and oriented to person, place, time, and purpose; appears stated age and cooperative; no apparent distress no labored breathing  HEENT:  Conjunctivae/corneas clear.  Neck: Supple. No jugular venous distention.   Respiratory: symmetrical; clear to auscultation bilaterally; no wheezes; no rhonchi; no rales  Cardiovascular: rhythm regular; rate controlled; no murmurs  Abdomen: Soft, nontender, nondistended  Extremities:  peripheral pulses present; no peripheral edema; no ulcers  Musculoskeletal: No clubbing, cyanosis, no bilateral lower extremity edema. Brisk capillary refill.   Skin:  No rashes  on visible skin  Neurologic: awake, alert and following commands     ASSESSMENT and PLAN:  ?? Dyspnea on exertion- proBNP 12. D-dimer 377, CTA negative for PE. Aspirin/statin. History of asthma. He states shortness of breath he was having prior to admission feels different than  how he did in RVR. EKG with pulmonary disease pattern.    ?? Chest discomfort- with abnormal EKG. Trops negative thus far. Unabel to obtain coronary CTA due to HR. Underwent stress test today which was normal however he went into afib RVR. Plan is for heart cath monday  ?? New onset afib RVR- started on digoxin and lovenox after stress, then he converted to NSR so dig was stopped. Now on metoprolol  ?? Hyperglycemia-   hemoglobin A1c. 7.3 start metformin after 48 hours post IV dye  ?? Asthma  ?? Obesity- BMI 35. Likely with OSA. Will need PSG on discharge           DISPOSITION: Continue current plan of care    Medications:  REVIEWED DAILY    Infusion Medications   ??? sodium chloride       Scheduled Medications   ??? metoprolol succinate  50 mg Oral BID   ??? enoxaparin  1 mg/kg SubCUTAneous BID   ??? sodium chloride flush  5-40 mL IntraVENous 2 times per day   ??? aspirin  81 mg Oral Daily   ??? atorvastatin  40 mg Oral Nightly   ??? sodium chloride flush  10 mL IntraVENous Once     PRN Meds: heparin (porcine), heparin (porcine), sodium chloride flush, sodium chloride, ondansetron **OR** ondansetron, acetaminophen **OR** acetaminophen, polyethylene glycol, albuterol    Labs:     Recent Labs     12/08/20  0524 12/08/20  1422 12/09/20  0546   WBC 12.1* 9.9 6.9   HGB 14.3 14.5 14.5   HCT 43.7 44.2 43.6   PLT 285 282 263       Recent Labs     12/07/20  2040 12/08/20  0524 12/09/20  0546   NA 137 139 135   K 4.2 3.8 4.3   CL 103 104 102   CO2 22 22 28    BUN 15 15 15    CREATININE 1.1 1.0 1.2   CALCIUM 9.2 8.6 8.7       Recent Labs     12/07/20  0442 12/08/20  0524 12/09/20  0546   PROT 7.4 7.2 7.4   ALKPHOS 68 63 61   ALT 35 28 26   AST 21 14 14    BILITOT 0.4 0.3 0.4       No results for input(s): INR in the last 72 hours.    No results for input(s): CKTOTAL, TROPONINI in the last 72 hours.    Chronic labs:    Lab Results   Component Value Date    CHOL 220 (H) 12/06/2020    TRIG 334 (H) 12/06/2020    HDL 33 12/06/2020    LDLCALC 120 (H)  12/06/2020    LABA1C 7.3 (H) 12/06/2020       Radiology: REVIEWED DAILY    +++++++++++++++++++++++++++++++++++++++++++++++++  Greer Pickerel, DO  Sound Physician - Hospitalist  [place] [facility] [place], [place]  +++++++++++++++++++++++++++++++++++++++++++++++++  NOTE: This report was transcribed using voice recognition software. Every effort was made to ensure accuracy; however, inadvertent computerized transcription errors may be present.

## 2020-12-09 NOTE — Consults (Signed)
Dara Lords, M.D.,FCCP  Marlis Edelson, D.O., F.A.C.O.I., [facility]  Alesia Banda, M.D.  Arnoldo Hooker, M.D.   Earnestine Leys, D.O.      Patient:  Thomas Walton 49 y.o. male MRN: [id number]     Date of Service: 12/09/2020      PULMONARY CONSULTATION    Reason for Consultation: dyspnea on exertion  Referring Physician: Dr. Wyvonne Lenz    Communication with the referring physician will be sent via the electronic medical record.    Chief Complaint: shortness of breath    CODE STATUS: full code    SUBJECTIVE:  HPI:  Thomas Walton is a 49 y.o. male not previously known to our practice who initially presented to the ED with shortness of breath for two weeks. He had travelled to [place] around Thanksgiving but tested negative for covid. Symptoms were worsened with exertion. He went to urgent care and was treated for bronchitis with Azithromycin, Prednisone and Albuterol. He has never had allergy symptoms or a pulmonary function test. He is a never smoker. He was diagnoses with afib/RVR. He is having a heart catheterization on Monday. He likely has sleep apnea. He does snore, has witnessed apneas, wakes snorting and gasping, does not feel refreshed when he wakes, and has daytime sleepiness. Will evaluate for sleep apnea.   Today, he is lying in bed in no apparent distress on room air. He has only an occasional cough, more clearing his throat. He does have some dyspnea on exertion. Lung sounds are CTA, diminished in the bases.     Past Medical History:   Diagnosis Date   ??? Asthma        History reviewed. No pertinent surgical history.    Family History   Problem Relation Age of Onset   ??? Heart Attack Father        Social History:   Social History     Socioeconomic History   ??? Marital status: Single     Spouse name: Not on file   ??? Number of children: Not on file   ??? Years of education: Not on file   ??? Highest education level: Not on file   Occupational History   ??? Not on file   Tobacco Use   ??? Smoking status: Never Smoker   ???  Smokeless tobacco: Never Used   Vaping Use   ??? Vaping Use: Never used   Substance and Sexual Activity   ??? Alcohol use: Yes     Comment: socially   ??? Drug use: Never   ??? Sexual activity: Not on file   Other Topics Concern   ??? Not on file   Social History Narrative   ??? Not on file     Social Determinants of Health     Financial Resource Strain:    ??? Difficulty of Paying Living Expenses: Not on file   Food Insecurity:    ??? Worried About Running Out of Food in the Last Year: Not on file   ??? Ran Out of Food in the Last Year: Not on file   Transportation Needs:    ??? Lack of Transportation (Medical): Not on file   ??? Lack of Transportation (Non-Medical): Not on file   Physical Activity:    ??? Days of Exercise per Week: Not on file   ??? Minutes of Exercise per Session: Not on file   Stress:    ??? Feeling of Stress : Not on file   Social Connections:    ???  Frequency of Communication with Friends and Family: Not on file   ??? Frequency of Social Gatherings with Friends and Family: Not on file   ??? Attends Religious Services: Not on file   ??? Active Member of Clubs or Organizations: Not on file   ??? Attends BankerClub or Organization Meetings: Not on file   ??? Marital Status: Not on file   Intimate Partner Violence:    ??? Fear of Current or Ex-Partner: Not on file   ??? Emotionally Abused: Not on file   ??? Physically Abused: Not on file   ??? Sexually Abused: Not on file   Housing Stability:    ??? Unable to Pay for Housing in the Last Year: Not on file   ??? Number of Places Lived in the Last Year: Not on file   ??? Unstable Housing in the Last Year: Not on file     Smoking history: The patient is a Never smoker.  ETOH:   reports current alcohol use.    Exposures: There  is not history of TB or TB exposure.  There is not asbestos or silica dust exposure.  The patient reports does not have coal, foundry, quarry or [employer] exposure.  Recent travel history [place] at Thanksgiving.  There is not  history of recreational or IV drug use. There is not  hot tub exposure. The patient does not have any exotic pets, turtles or exotic birds.       Vaccines:    Influenza:  Refused  Pneumococcal Polysaccharide:  Not indicated  He was vaccinated for covid    There is no immunization history on file for this patient.     Home Meds: Medications Prior to Admission: ALBUTEROL SULFATE, SENSOR, IN, Inhale into the lungs    CURRENT MEDS :  Scheduled Meds:  ??? metoprolol succinate  50 mg Oral BID   ??? enoxaparin  1 mg/kg SubCUTAneous BID   ??? sodium chloride flush  5-40 mL IntraVENous 2 times per day   ??? aspirin  81 mg Oral Daily   ??? atorvastatin  40 mg Oral Nightly   ??? sodium chloride flush  10 mL IntraVENous Once       Continuous Infusions:  ??? sodium chloride         Allergies   Allergen Reactions   ??? Shellfish-Derived Products Anaphylaxis     Crab, lobster, shrimp, etc.       REVIEW OF SYSTEMS:  Constitutional: Denies fever, weight loss, night sweats, and fatigue  Skin: Denies pigmentation, dark lesions, and rashes   HEENT: Denies hearing loss, tinnitus, ear drainage, epistaxis, sore throat, and hoarseness.  Cardiovascular: Denies palpitations, chest pain, and chest pressure.  Respiratory: Denies cough, dyspnea at rest, hemoptysis, apnea, and choking.  Gastrointestinal: Denies nausea, vomiting, poor appetite, diarrhea, heartburn or reflux  Genitourinary: Denies dysuria, frequency, urgency or hematuria  Musculoskeletal: Denies myalgias, muscle weakness, and bone pain  Neurological: Denies dizziness, vertigo, headache, and focal weakness  Psychological: Denies anxiety and depression  Endocrine: Denies heat intolerance and cold intolerance  Hematopoietic/Lymphatic: Denies bleeding problems and blood transfusions    OBJECTIVE:   BP (!) 143/82    Pulse 80    Temp 98 ??F (36.7 ??C) (Temporal)    Resp 16    Ht 6' 2.5" (1.892 m)    Wt 280 lb 9.6 oz (127.3 kg)    SpO2 97%    BMI 35.54 kg/m??   SpO2 Readings from Last 1 Encounters:   12/09/20  97%        I/O:    Intake/Output Summary (Last 24  hours) at 12/09/2020 1221  Last data filed at 12/09/2020 0300  Gross per 24 hour   Intake --   Output 450 ml   Net -450 ml     Vent Information  SpO2: 97 %       Physical Exam:  General: The patient is lying in bed comfortably without any distress.  Breathing is not labored  HEENT: Pupils are equal round and reactive to light, there are no oral lesions and no post-nasal drip   Neck: supple without adenopathy  Cardiovascular: regular rate and rhythm without murmur or gallop  Respiratory: Clear to auscultation bilaterally without wheezing or crackles, diminished in the bases.  Air entry is symmetric  Abdomen: soft, non-tender, non-distended, normal bowel sounds  Extremities: warm, no edema, no clubbing  Skin: no rash or lesion  Neurologic: CN II-XII grossly intact, no focal deficits    Pulmonary Function Testing personally reviewed and interpreted  None on file    Imaging personally reviewed:  12/05/2020  No acute process.    12/05/2020 CTA chest  No evidence of pulmonary embolism or acute pulmonary abnormality.    Echo:  None on file    Labs:  Lab Results   Component Value Date    WBC 6.9 12/09/2020    HGB 14.5 12/09/2020    HCT 43.6 12/09/2020    MCV 88.3 12/09/2020    MCH 29.4 12/09/2020    MCHC 33.3 12/09/2020    RDW 14.6 12/09/2020    PLT 263 12/09/2020    MPV 8.8 12/09/2020     Lab Results   Component Value Date    NA 135 12/09/2020    K 4.3 12/09/2020    K 5.4 12/07/2020    CL 102 12/09/2020    CO2 28 12/09/2020    BUN 15 12/09/2020    CREATININE 1.2 12/09/2020    LABALBU 3.8 12/09/2020    CALCIUM 8.7 12/09/2020    GFRAA >60 12/09/2020    LABGLOM >60 12/09/2020     No results found for: PROTIME, INR  No results for input(s): PROBNP in the last 72 hours.  No results for input(s): TROPONINI in the last 72 hours.  No results for input(s): PROCAL in the last 72 hours.  This SmartLink has not been configured with any valid records.       Assessment:  1. Dyspnea on exertion  2. New onset atrial fibrillation with  RVR  3. Likely OSA  4. overweight    Plan:  1. Stress test negative  2. Albuterol as needed, denies improvement  3. DVT prophylaxis  4. Will need PSG on discharge    Thank you for allowing me to participate in the care of Harlene Salts.   Please feel free to call with questions.     This plan of care was reviewed in collaboration with Dr. Lamarr Lulas    Electronically signed by Jiles Garter, APRN - CNP on 12/09/2020 at 12:21 PM      Note: This report was completed utilizing computer voice recognition software. Every effort has been made to ensure accuracy, however; inadvertent computerized transcription errors may be present    I personally saw, examined and provided care for the patient. Radiographs, labs and medication list were reviewed by me independently. I spoke with bedside nursing, therapists and consultants.  Would recommend a PSG and PFTs as OP.  Patient is stable  to be discharged from a pulmonary stand point.     The case was discussed in detail and plans for care were established. Review of Residents documentation was conducted and revisions were made as appropriate. I agree with the above documented exam, problem list and plan of care.    Clinton Gallant, MD

## 2020-12-10 LAB — BASIC METABOLIC PANEL W/ REFLEX TO MG FOR LOW K
Anion Gap: 9 mmol/L (ref 7–16)
BUN: 11 mg/dL (ref 6–20)
CO2: 25 mmol/L (ref 22–29)
Calcium: 8.7 mg/dL (ref 8.6–10.2)
Chloride: 107 mmol/L (ref 98–107)
Creatinine: 1.1 mg/dL (ref 0.7–1.2)
GFR African American: >60
GFR Non-African American: >60 mL/min/{1.73_m2} (ref >=60)
Glucose: 140 mg/dL — ABNORMAL HIGH (ref 74–99)
Potassium reflex Magnesium: 4 mmol/L (ref 3.5–5.0)
Sodium: 141 mmol/L (ref 132–146)

## 2020-12-10 LAB — POCT GLUCOSE
Meter Glucose: 143 mg/dL — ABNORMAL HIGH (ref 74–99)
Meter Glucose: 119 mg/dL — ABNORMAL HIGH (ref 74–99)

## 2020-12-10 LAB — APTT: aPTT: 39.4 s — ABNORMAL HIGH (ref 24.5–35.1)

## 2020-12-10 MED ORDER — PREDNISONE 10 MG PO TABS
10 MG | Freq: Once | ORAL | Status: AC
Start: 2020-12-10 — End: 2020-12-12
  Administered 2020-12-12: 13:00:00 50 mg via ORAL

## 2020-12-10 MED ORDER — PREDNISONE 10 MG PO TABS
10 MG | Freq: Once | ORAL | Status: AC
Start: 2020-12-10 — End: 2020-12-11
  Administered 2020-12-12: 03:00:00 50 mg via ORAL

## 2020-12-10 MED ORDER — DIPHENHYDRAMINE HCL 25 MG PO TABS
25 MG | Freq: Once | ORAL | Status: AC
Start: 2020-12-10 — End: 2020-12-12
  Administered 2020-12-12: 13:00:00 50 mg via ORAL

## 2020-12-10 MED ORDER — GLUCAGON HCL RDNA (DIAGNOSTIC) 1 MG IJ SOLR
1 MG | INTRAMUSCULAR | Status: DC | PRN
Start: 2020-12-10 — End: 2020-12-12

## 2020-12-10 MED ORDER — DEXTROSE 5 % IV SOLN
5 % | INTRAVENOUS | Status: DC | PRN
Start: 2020-12-10 — End: 2020-12-12

## 2020-12-10 MED ORDER — INSULIN LISPRO 100 UNIT/ML SC SOLN
100 UNIT/ML | Freq: Three times a day (TID) | SUBCUTANEOUS | Status: DC
Start: 2020-12-10 — End: 2020-12-12
  Administered 2020-12-10 – 2020-12-11 (×2): 1 [IU] via SUBCUTANEOUS
  Administered 2020-12-12: 16:00:00 2 [IU] via SUBCUTANEOUS

## 2020-12-10 MED ORDER — ENOXAPARIN SODIUM 40 MG/0.4ML SC SOLN
400.4 MG/0.4ML | Freq: Every day | SUBCUTANEOUS | Status: DC
Start: 2020-12-10 — End: 2020-12-12
  Administered 2020-12-10 – 2020-12-11 (×2): 40 mg via SUBCUTANEOUS

## 2020-12-10 MED ORDER — LOSARTAN POTASSIUM 50 MG PO TABS
50 MG | Freq: Every day | ORAL | Status: DC
Start: 2020-12-10 — End: 2020-12-12
  Administered 2020-12-10 – 2020-12-12 (×3): 50 mg via ORAL

## 2020-12-10 MED ORDER — PREDNISONE 20 MG PO TABS
20 MG | Freq: Once | ORAL | Status: AC
Start: 2020-12-10 — End: 2020-12-12
  Administered 2020-12-12: 08:00:00 50 mg via ORAL

## 2020-12-10 MED ORDER — GLUCOSE 40 % PO GEL
40 % | ORAL | Status: DC | PRN
Start: 2020-12-10 — End: 2020-12-12

## 2020-12-10 MED ORDER — INSULIN LISPRO 100 UNIT/ML SC SOLN
100 UNIT/ML | Freq: Every evening | SUBCUTANEOUS | Status: DC
Start: 2020-12-10 — End: 2020-12-12
  Administered 2020-12-11 – 2020-12-12 (×2): 1 [IU] via SUBCUTANEOUS

## 2020-12-10 MED ORDER — DEXTROSE 50 % IV SOLN
50 % | INTRAVENOUS | Status: DC | PRN
Start: 2020-12-10 — End: 2020-12-12

## 2020-12-10 MED FILL — ASPIRIN 81 MG PO CHEW: 81 mg | ORAL | Qty: 1

## 2020-12-10 MED FILL — ENOXAPARIN SODIUM 40 MG/0.4ML SC SOLN: 40 MG/0.4ML | SUBCUTANEOUS | Qty: 0.4

## 2020-12-10 MED FILL — ATORVASTATIN CALCIUM 40 MG PO TABS: 40 mg | ORAL | Qty: 1

## 2020-12-10 MED FILL — NORMAL SALINE FLUSH 0.9 % IV SOLN: 0.9 % | INTRAVENOUS | Qty: 10

## 2020-12-10 MED FILL — PREDNISONE 10 MG PO TABS: 10 mg | ORAL | Qty: 1

## 2020-12-10 MED FILL — METOPROLOL SUCCINATE ER 50 MG PO TB24: 50 mg | ORAL | Qty: 1

## 2020-12-10 MED FILL — LOSARTAN POTASSIUM 50 MG PO TABS: 50 mg | ORAL | Qty: 1

## 2020-12-10 MED FILL — HUMALOG 100 UNIT/ML SC SOLN: 100 [IU]/mL | SUBCUTANEOUS | Qty: 1

## 2020-12-10 NOTE — Progress Notes (Signed)
Inpatient Cardiology Progress note     PATIENT IS BEING FOLLOWED FOR: DOE    Thomas Walton is a 49 y.o. male sen in consultation this admission by Dr. Joen Laura     SUBJECTIVE: no problems overnight. Denies CP or SOB  OBJECTIVE: No apparent distress     ROS:  Consist: Denies fevers, chills or night sweats  Heart: Denies chest pain, palpitations, lightheadedness, dizziness or syncope  Lungs: Denies SOB, cough, wheezing, orthopnea or PND  GI: Denies abdominal pain, vomiting or diarrhea    PHYSICAL EXAM:   BP (!) 163/91    Pulse 74    Temp 98 ??F (36.7 ??C) (Temporal)    Resp 18    Ht 6' 2.5" (1.892 m)    Wt 281 lb 9.6 oz (127.7 kg)    SpO2 97%    BMI 35.67 kg/m??    B/P Range last 24 hours: Systolic (24hrs), Avg:151 , Min:121 , Max:163    Diastolic (24hrs), Avg:85, Min:60, Max:92    CONST: Well developed, well nourished AA male who appears of stated age. Awake, alert and cooperative. No apparent distress  HEENT:   Head- Normocephalic, atraumatic   Eyes- Conjunctivae pink, anicteric  Throat- Oral mucosa pink and moist  Neck-  No stridor, trachea midline, no jugular venous distention. No carotid bruit  CHEST: Chest symmetrical and non-tender to palpation. No accessory muscle use or intercostal retractions  RESPIRATORY:  Lung sounds - clear throughout fields   CARDIOVASCULAR:     Heart Inspection- shows no noted pulsations  Heart Palpation- no heaves or thrills; PMI is non-displaced   Heart Ausculation- Regular rate and rhythm, no murmur. No s3, s4 or rub   PV: No lower extremity edema. No varicosities. Pedal pulses palpable, no clubbing or cyanosis   ABDOMEN: Soft, non-tender to light palpation. Bowel sounds present. No palpable masses no organomegaly; no abdominal bruit  MS: Good muscle strength and tone. No atrophy or abnormal movements.   GU: Deferred  SKIN: Warm and dry no statis dermatitis or ulcers   NEURO / PSYCH: Oriented to person, place and time. Speech clear and appropriate. Follows all commands. Pleasant affect        Intake/Output Summary (Last 24 hours) at 12/10/2020 1019  Last data filed at 12/10/2020 0929  Gross per 24 hour   Intake 480 ml   Output --   Net 480 ml       Weight:   Wt Readings from Last 3 Encounters:   12/10/20 281 lb 9.6 oz (127.7 kg)     Current Inpatient Medications:  ??? metoprolol succinate  50 mg Oral BID   ??? [START ON 12/11/2020] predniSONE  50 mg Oral Once    Followed by   ??? [START ON 12/12/2020] predniSONE  50 mg Oral Once    Followed by   ??? [START ON 12/12/2020] predniSONE  50 mg Oral Once    Followed by   ??? [START ON 12/12/2020] diphenhydrAMINE  50 mg Oral Once   ??? enoxaparin  40 mg SubCUTAneous Daily   ??? sodium chloride flush  5-40 mL IntraVENous 2 times per day   ??? aspirin  81 mg Oral Daily   ??? atorvastatin  40 mg Oral Nightly   ??? sodium chloride flush  10 mL IntraVENous Once       IV Infusions (if any):  ??? sodium chloride         DIAGNOSTIC/ LABORATORY DATA:  Labs:   CBC:   Recent Labs  12/08/20  1422 12/09/20  0546   WBC 9.9 6.9   HGB 14.5 14.5   HCT 44.2 43.6   PLT 282 263     BMP:   Recent Labs     12/09/20  0546 12/10/20  0652   NA 135 141   K 4.3 4.0   CO2 28 25   BUN 15 11   CREATININE 1.2 1.1   LABGLOM >60 >60   CALCIUM 8.7 8.7       HgA1c:   Lab Results   Component Value Date    LABA1C 7.3 (H) 12/06/2020     APTT:  Recent Labs     12/09/20  1240 12/09/20  1944   APTT 34.9 39.4*     FASTING LIPID PANEL:  Lab Results   Component Value Date    CHOL 220 12/06/2020    HDL 33 12/06/2020    LDLCALC 120 12/06/2020    TRIG 334 12/06/2020     LIVER PROFILE:  Recent Labs     12/08/20  0524 12/09/20  0546   AST 14 14   ALT 28 26   LABALBU 3.7 3.8       CXR 12/05/20:   No acute process.    Pulmonary CTA 12/05/20:  No evidence of pulmonary embolism or acute pulmonary abnormality.    12 lead EKG 12/09/20:  Normal sinus rhythm  Left anterior fascicular block  Nonspecific T wave abnormality  Abnormal ECG    Coronary CTA ( 12/07/20 ) was attempted but could not be performed due to inability to achieve a  low heart rate      Treadmill nuclear stress test 12/08/20:  Exercise component:  1. No chest pain  2. Exercise time: 9:31, MPHR: 81%  3. No new arrhythmias during exercise portion of test --> new onset atrial fibrillation with RVR during recovery phase  4. No EKG changes suggestive of stress induced ischemia  Nuclear results:  1. The myocardial perfusion imaging was normal with soft tissue  attenuation.  2. Gated SPECT left ventricular ejection fraction was calculated to be  57% with normal myocardial wall motion.    Telemetry: SR      ASSESSMENT:   1. Dyspnea on exertion.  2. Paroxysmal AF with RVR.  CHA2DS2-VASc score 2 ( at least )  3. Shellfish allergy - anaphylaxis  4. Mixed HLD ( with low HDL )  5. HTN  6. DM  7. ? OSA per pulmonary --> sleep study as out patient        PLAN:  1. Start an ARB. Monitor BP  2. Patient should be on oral hypoglycemic agents ( ? Metformin ) --> will defer to the primary service  3. Needs also to be on OAC --> will start after cath.   4. For Cath Monday 12/12/20    Electronically signed by Gilman Buttner, MD on 12/10/2020 at 10:19 AM

## 2020-12-10 NOTE — Plan of Care (Signed)
Problem: Pain:  Goal: Control of acute pain  Description: Control of acute pain  Outcome: Ongoing

## 2020-12-10 NOTE — Progress Notes (Signed)
Hospitalist Progress Note      SYNOPSIS: Patient admitted on 12/05/2020  presented to the ER with complaint of shortness of breath over the past 2 weeks.  Shortness of breath is worsened with exertion such as climbing the stairs.  He went to urgent care prior and was treated for bronchitis with azithromycin, albuterol, prednisone.  He was Covid negative at that time.  He does state that he went on a 6-hour car trip for Thanksgiving.  He has not had any improvement of his shortness of breath symptoms.  He does not necessarily admit to chest pain but admits to "inflammation" in his chest.  He states that he placed ice pack to the chest wall for this.  He denies ever being a smoker denies any COPD.  He does have an asthma history.  In the ER he was found to have abnormal EKG.  He states his last stress test was in 2019 which he states was normal.  ??  CTA of the chest was normal without any evidence of PE.  Troponins have been negative  Was for coronary CTA yesterday but HR was not able to be low enough for testing  Underwent stress test 12/9 and went into Afib RVR  Initially plan was for cardioversion but he went back into NSR      SUBJECTIVE:  Stable overnight. No other overnight issues reported.   Patient seen and examined  Records reviewed.   Was for coronary CTA yesterday but HR was not able to be low enough for testing  Underwent stress test 12/9 and went into Afib RVR  He states that the shortness of breath that he had prior to RVR was different than this feeling  Initially plan was for cardioversion but he went back into NSR  Plan is for heart cath Monday  No complaints        Temp (24hrs), Avg:97.3 ??F (36.3 ??C), Min:96.7 ??F (35.9 ??C), Max:97.7 ??F (36.5 ??C)    DIET: ADULT DIET; Regular  Diet NPO  CODE: Full Code  No intake or output data in the 24 hours ending 12/10/20 4627    Review of Systems  All bolded are positive; please see HPI  General:  Fever, chills, diaphoresis, fatigue, malaise, night sweats,  weight loss  Psychological:  Anxiety, disorientation, hallucinations.  ENT:  Epistaxis, headaches, vertigo, visual changes.  Cardiovascular:  Chest pain, irregular heartbeats, palpitations, paroxysmal nocturnal dyspnea.  Respiratory:  Shortness of breath, coughing, sputum production, hemoptysis, wheezing, orthopnea.  Gastrointestinal:  Nausea, vomiting, diarrhea, heartburn, constipation, abdominal pain, hematemesis, hematochezia, melena, acholic stools  Genito-Urinary:  Dysuria, urgency, frequency, hematuria  Musculoskeletal:  Joint pain, joint stiffness, joint swelling, muscle pain  Neurology:  Headache, focal neurological deficits, weakness, numbness, paresthesia  Derm:  Rashes, ulcers, excoriations, bruising  Extremities:  Decreased ROM, peripheral edema, mottling      OBJECTIVE:    BP (!) 163/91    Pulse 74    Temp 97.7 ??F (36.5 ??C) (Temporal)    Resp 14    Ht 6' 2.5" (1.892 m)    Wt 281 lb 9.6 oz (127.7 kg)    SpO2 95%    BMI 35.67 kg/m??     General appearance:  awake, alert, and oriented to person, place, time, and purpose; appears stated age and cooperative; no apparent distress no labored breathing  HEENT:  Conjunctivae/corneas clear.   Neck: Supple. No jugular venous distention.   Respiratory: symmetrical; clear to auscultation bilaterally; no wheezes; no rhonchi; no  rales  Cardiovascular: rhythm regular; rate controlled; no murmurs  Abdomen: Soft, nontender, nondistended  Extremities:  peripheral pulses present; no peripheral edema; no ulcers  Musculoskeletal: No clubbing, cyanosis, no bilateral lower extremity edema. Brisk capillary refill.   Skin:  No rashes  on visible skin  Neurologic: awake, alert and following commands     ASSESSMENT and PLAN:  ?? Dyspnea on exertion- proBNP 12. D-dimer 377, CTA negative for PE. Aspirin/statin. History of asthma. He states shortness of breath he was having prior to admission feels different than how he did in RVR. EKG with pulmonary disease pattern.    ?? Chest  discomfort- with abnormal EKG. Trops negative thus far. Unabel to obtain coronary CTA due to HR. Underwent stress test today which was normal however he went into afib RVR. Plan is for heart cath monday  ?? New onset afib RVR- started on digoxin and lovenox after stress, then he converted to NSR so dig was stopped. Now on metoprolol  ?? Hyperglycemia-   hemoglobin A1c. 7.3 start metformin after 48 hours post IV dye  ?? Asthma  ?? Obesity- BMI 35. Likely with OSA. Will need PSG on discharge           DISPOSITION: Continue current plan of care    Medications:  REVIEWED DAILY    Infusion Medications   ??? sodium chloride       Scheduled Medications   ??? metoprolol succinate  50 mg Oral BID   ??? [START ON 12/11/2020] predniSONE  50 mg Oral Once    Followed by   ??? [START ON 12/12/2020] predniSONE  50 mg Oral Once    Followed by   ??? [START ON 12/12/2020] predniSONE  50 mg Oral Once    Followed by   ??? [START ON 12/12/2020] diphenhydrAMINE  50 mg Oral Once   ??? enoxaparin  40 mg SubCUTAneous Daily   ??? sodium chloride flush  5-40 mL IntraVENous 2 times per day   ??? aspirin  81 mg Oral Daily   ??? atorvastatin  40 mg Oral Nightly   ??? sodium chloride flush  10 mL IntraVENous Once     PRN Meds: sodium chloride flush, sodium chloride, ondansetron **OR** ondansetron, acetaminophen **OR** acetaminophen, polyethylene glycol, albuterol    Labs:     Recent Labs     12/08/20  0524 12/08/20  1422 12/09/20  0546   WBC 12.1* 9.9 6.9   HGB 14.3 14.5 14.5   HCT 43.7 44.2 43.6   PLT 285 282 263       Recent Labs     12/08/20  0524 12/09/20  0546 12/10/20  0652   NA 139 135 141   K 3.8 4.3 4.0   CL 104 102 107   CO2 22 28 25    BUN 15 15 11    CREATININE 1.0 1.2 1.1   CALCIUM 8.6 8.7 8.7       Recent Labs     12/08/20  0524 12/09/20  0546   PROT 7.2 7.4   ALKPHOS 63 61   ALT 28 26   AST 14 14   BILITOT 0.3 0.4       No results for input(s): INR in the last 72 hours.    No results for input(s): CKTOTAL, TROPONINI in the last 72 hours.    Chronic  labs:    Lab Results   Component Value Date    CHOL 220 (H) 12/06/2020    TRIG 334 (H) 12/06/2020  HDL 33 12/06/2020    LDLCALC 120 (H) 12/06/2020    LABA1C 7.3 (H) 12/06/2020       Radiology: REVIEWED DAILY    +++++++++++++++++++++++++++++++++++++++++++++++++  Greer Pickerel, DO  [employer] - Hospitalist  [place] - [facility] [place], [place]  +++++++++++++++++++++++++++++++++++++++++++++++++  NOTE: This report was transcribed using voice recognition software. Every effort was made to ensure accuracy; however, inadvertent computerized transcription errors may be present.

## 2020-12-11 LAB — POCT GLUCOSE
Meter Glucose: 192 mg/dL — ABNORMAL HIGH (ref 74–99)
Meter Glucose: 128 mg/dL — ABNORMAL HIGH (ref 74–99)
Meter Glucose: 116 mg/dL — ABNORMAL HIGH (ref 74–99)
Meter Glucose: 173 mg/dL — ABNORMAL HIGH (ref 74–99)

## 2020-12-11 LAB — BASIC METABOLIC PANEL W/ REFLEX TO MG FOR LOW K
Anion Gap: 10 mmol/L (ref 7–16)
BUN: 12 mg/dL (ref 6–20)
CO2: 26 mmol/L (ref 22–29)
Calcium: 9 mg/dL (ref 8.6–10.2)
Chloride: 102 mmol/L (ref 98–107)
Creatinine: 1.1 mg/dL (ref 0.7–1.2)
GFR African American: >60
GFR Non-African American: >60 mL/min/{1.73_m2} (ref >=60)
Glucose: 128 mg/dL — ABNORMAL HIGH (ref 74–99)
Potassium reflex Magnesium: 4.1 mmol/L (ref 3.5–5.0)
Sodium: 138 mmol/L (ref 132–146)

## 2020-12-11 LAB — CBC
Hematocrit: 43.9 % (ref 37.0–54.0)
Hemoglobin: 14.6 g/dL (ref 12.5–16.5)
MCH: 28.6 pg (ref 26.0–35.0)
MCHC: 33.3 % (ref 32.0–34.5)
MCV: 86.1 fL (ref 80.0–99.9)
MPV: 8.9 fL (ref 7.0–12.0)
Platelets: 278 E9/L (ref 130–450)
RBC: 5.1 E12/L (ref 3.80–5.80)
RDW: 14.4 fL (ref 11.5–15.0)
WBC: 7.4 E9/L (ref 4.5–11.5)

## 2020-12-11 MED ORDER — SODIUM CHLORIDE 0.9 % IV SOLN
0.9 % | INTRAVENOUS | Status: DC
Start: 2020-12-11 — End: 2020-12-12
  Administered 2020-12-12: 05:00:00 via INTRAVENOUS

## 2020-12-11 MED FILL — NORMAL SALINE FLUSH 0.9 % IV SOLN: 0.9 % | INTRAVENOUS | Qty: 10

## 2020-12-11 MED FILL — ASPIRIN 81 MG PO CHEW: 81 mg | ORAL | Qty: 1

## 2020-12-11 MED FILL — ATORVASTATIN CALCIUM 40 MG PO TABS: 40 mg | ORAL | Qty: 1

## 2020-12-11 MED FILL — PREDNISONE 10 MG PO TABS: 10 mg | ORAL | Qty: 1

## 2020-12-11 MED FILL — PAIN & FEVER 325 MG PO TABS: 325 mg | ORAL | Qty: 2

## 2020-12-11 MED FILL — LOSARTAN POTASSIUM 50 MG PO TABS: 50 mg | ORAL | Qty: 1

## 2020-12-11 MED FILL — HUMALOG 100 UNIT/ML SC SOLN: 100 [IU]/mL | SUBCUTANEOUS | Qty: 1

## 2020-12-11 MED FILL — ENOXAPARIN SODIUM 40 MG/0.4ML SC SOLN: 40 MG/0.4ML | SUBCUTANEOUS | Qty: 0.4

## 2020-12-11 MED FILL — METOPROLOL SUCCINATE ER 50 MG PO TB24: 50 mg | ORAL | Qty: 1

## 2020-12-11 NOTE — Progress Notes (Signed)
Hospitalist Progress Note      SYNOPSIS: Patient admitted on 12/05/2020  presented to the ER with complaint of shortness of breath over the past 2 weeks.  Shortness of breath is worsened with exertion such as climbing the stairs.  He went to urgent care prior and was treated for bronchitis with azithromycin, albuterol, prednisone.  He was Covid negative at that time.  He does state that he went on a 6-hour car trip for Thanksgiving.  He has not had any improvement of his shortness of breath symptoms.  He does not necessarily admit to chest pain but admits to "inflammation" in his chest.  He states that he placed ice pack to the chest wall for this.  He denies ever being a smoker denies any COPD.  He does have an asthma history.  In the ER he was found to have abnormal EKG.  He states his last stress test was in 2019 which he states was normal.  ??  CTA of the chest was normal without any evidence of PE.  Troponins have been negative  Was for coronary CTA yesterday but HR was not able to be low enough for testing  Underwent stress test 12/9 and went into Afib RVR  Initially plan was for cardioversion but he went back into NSR      SUBJECTIVE:  Stable overnight. No other overnight issues reported.   Patient seen and examined  Records reviewed.   Was for coronary CTA yesterday but HR was not able to be low enough for testing  Underwent stress test 12/9 and went into Afib RVR  He states that the shortness of breath that he had prior to RVR was different than this feeling  Initially plan was for cardioversion but he went back into NSR  Plan is for heart cath Monday  No complaints        Temp (24hrs), Avg:97.7 ??F (36.5 ??C), Min:97.3 ??F (36.3 ??C), Max:98 ??F (36.7 ??C)    DIET: ADULT DIET; Regular  Diet NPO  CODE: Full Code    Intake/Output Summary (Last 24 hours) at 12/11/2020 3557  Last data filed at 12/10/2020 2047  Gross per 24 hour   Intake 1080 ml   Output --   Net 1080 ml       Review of Systems  All bolded are  positive; please see HPI  General:  Fever, chills, diaphoresis, fatigue, malaise, night sweats, weight loss  Psychological:  Anxiety, disorientation, hallucinations.  ENT:  Epistaxis, headaches, vertigo, visual changes.  Cardiovascular:  Chest pain, irregular heartbeats, palpitations, paroxysmal nocturnal dyspnea.  Respiratory:  Shortness of breath, coughing, sputum production, hemoptysis, wheezing, orthopnea.  Gastrointestinal:  Nausea, vomiting, diarrhea, heartburn, constipation, abdominal pain, hematemesis, hematochezia, melena, acholic stools  Genito-Urinary:  Dysuria, urgency, frequency, hematuria  Musculoskeletal:  Joint pain, joint stiffness, joint swelling, muscle pain  Neurology:  Headache, focal neurological deficits, weakness, numbness, paresthesia  Derm:  Rashes, ulcers, excoriations, bruising  Extremities:  Decreased ROM, peripheral edema, mottling      OBJECTIVE:    BP 130/82    Pulse 71    Temp 97.8 ??F (36.6 ??C) (Temporal)    Resp 16    Ht 6' 2.5" (1.892 m)    Wt 281 lb 9.6 oz (127.7 kg)    SpO2 99%    BMI 35.67 kg/m??     General appearance:  awake, alert, and oriented to person, place, time, and purpose; appears stated age and cooperative; no apparent distress no labored breathing  HEENT:  Conjunctivae/corneas clear.   Neck: Supple. No jugular venous distention.   Respiratory: symmetrical; clear to auscultation bilaterally; no wheezes; no rhonchi; no rales  Cardiovascular: rhythm regular; rate controlled; no murmurs  Abdomen: Soft, nontender, nondistended  Extremities:  peripheral pulses present; no peripheral edema; no ulcers  Musculoskeletal: No clubbing, cyanosis, no bilateral lower extremity edema. Brisk capillary refill.   Skin:  No rashes  on visible skin  Neurologic: awake, alert and following commands     ASSESSMENT and PLAN:  ?? Dyspnea on exertion- proBNP 12. D-dimer 377, CTA negative for PE. Aspirin/statin. History of asthma. He states shortness of breath he was having prior to admission  feels different than how he did in RVR. EKG with pulmonary disease pattern.    ?? Chest discomfort- with abnormal EKG. Trops negative thus far. Unabel to obtain coronary CTA due to HR. Underwent stress test today which was normal however he went into afib RVR. Plan is for heart cath monday  ?? New onset afib RVR- started on digoxin and lovenox after stress, then he converted to NSR so dig was stopped. Now on metoprolol  ?? Hyperglycemia-   hemoglobin A1c. 7.3 start metformin after 48 hours post IV dye  ?? Asthma  ?? Obesity- BMI 35. Likely with OSA. Will need PSG on discharge           DISPOSITION: Continue current plan of care    Medications:  REVIEWED DAILY    Infusion Medications   ??? dextrose     ??? sodium chloride       Scheduled Medications   ??? losartan  50 mg Oral Daily   ??? insulin lispro  0-6 Units SubCUTAneous TID WC   ??? insulin lispro  0-3 Units SubCUTAneous Nightly   ??? metoprolol succinate  50 mg Oral BID   ??? predniSONE  50 mg Oral Once    Followed by   ??? [START ON 12/12/2020] predniSONE  50 mg Oral Once    Followed by   ??? [START ON 12/12/2020] predniSONE  50 mg Oral Once    Followed by   ??? [START ON 12/12/2020] diphenhydrAMINE  50 mg Oral Once   ??? enoxaparin  40 mg SubCUTAneous Daily   ??? sodium chloride flush  5-40 mL IntraVENous 2 times per day   ??? aspirin  81 mg Oral Daily   ??? atorvastatin  40 mg Oral Nightly   ??? sodium chloride flush  10 mL IntraVENous Once     PRN Meds: glucose, dextrose, glucagon (rDNA), dextrose, sodium chloride flush, sodium chloride, ondansetron **OR** ondansetron, acetaminophen **OR** acetaminophen, polyethylene glycol, albuterol    Labs:     Recent Labs     12/08/20  1422 12/09/20  0546 12/11/20  0516   WBC 9.9 6.9 7.4   HGB 14.5 14.5 14.6   HCT 44.2 43.6 43.9   PLT 282 263 278       Recent Labs     12/09/20  0546 12/10/20  0652 12/11/20  0516   NA 135 141 138   K 4.3 4.0 4.1   CL 102 107 102   CO2 28 25 26    BUN 15 11 12    CREATININE 1.2 1.1 1.1   CALCIUM 8.7 8.7 9.0       Recent  Labs     12/09/20  0546   PROT 7.4   ALKPHOS 61   ALT 26   AST 14   BILITOT 0.4  No results for input(s): INR in the last 72 hours.    No results for input(s): CKTOTAL, TROPONINI in the last 72 hours.    Chronic labs:    Lab Results   Component Value Date    CHOL 220 (H) 12/06/2020    TRIG 334 (H) 12/06/2020    HDL 33 12/06/2020    LDLCALC 120 (H) 12/06/2020    LABA1C 7.3 (H) 12/06/2020       Radiology: REVIEWED DAILY    +++++++++++++++++++++++++++++++++++++++++++++++++  Greer Pickerel, DO  [employer] - Hospitalist  [place] - [facility] [place], [place]  +++++++++++++++++++++++++++++++++++++++++++++++++  NOTE: This report was transcribed using voice recognition software. Every effort was made to ensure accuracy; however, inadvertent computerized transcription errors may be present.

## 2020-12-11 NOTE — Plan of Care (Signed)
Problem: Breathing Pattern - Ineffective:  Goal: Ability to achieve and maintain a regular respiratory rate will improve  Description: Ability to achieve and maintain a regular respiratory rate will improve  Outcome: Met This Shift     Problem: Pain:  Goal: Pain level will decrease  Description: Pain level will decrease  Outcome: Ongoing  Goal: Control of acute pain  Description: Control of acute pain  Outcome: Met This Shift  Goal: Control of chronic pain  Description: Control of chronic pain  Outcome: Met This Shift

## 2020-12-11 NOTE — Progress Notes (Signed)
Inpatient Cardiology Progress note     PATIENT IS BEING FOLLOWED FOR: DOE    Thomas Walton is a 49 y.o. male sen in consultation this admission by Dr. Joen Laura     SUBJECTIVE: no problems overnight. Denies CP or SOB  OBJECTIVE: No apparent distress     ROS:  Consist: Denies fevers, chills or night sweats  Heart: Denies chest pain, palpitations, lightheadedness, dizziness or syncope  Lungs: Denies SOB, cough, wheezing, orthopnea or PND  GI: Denies abdominal pain, vomiting or diarrhea    PHYSICAL EXAM:   BP 136/89    Pulse 72    Temp 97.6 ??F (36.4 ??C) (Temporal)    Resp 16    Ht 6' 2.5" (1.892 m)    Wt 281 lb 9.6 oz (127.7 kg)    SpO2 98%    BMI 35.67 kg/m??    B/P Range last 24 hours: Systolic (24hrs), Avg:139 , Min:130 , Max:159    Diastolic (24hrs), Avg:86, Min:78, Max:95    CONST: Well developed, well nourished AA male who appears of stated age. Awake, alert and cooperative. No apparent distress  HEENT:   Head- Normocephalic, atraumatic   Eyes- Conjunctivae pink, anicteric  Throat- Oral mucosa pink and moist  Neck-  No stridor, trachea midline, no jugular venous distention. No carotid bruit  CHEST: Chest symmetrical and non-tender to palpation. No accessory muscle use or intercostal retractions  RESPIRATORY:  Lung sounds - clear throughout fields   CARDIOVASCULAR:     Heart Inspection- shows no noted pulsations  Heart Palpation- no heaves or thrills; PMI is non-displaced   Heart Ausculation- Regular rate and rhythm, no murmur. No s3, s4 or rub   PV: No lower extremity edema. No varicosities. Pedal pulses palpable, no clubbing or cyanosis   ABDOMEN: Soft, non-tender to light palpation. Bowel sounds present. No palpable masses no organomegaly; no abdominal bruit  MS: Good muscle strength and tone. No atrophy or abnormal movements.   GU: Deferred  SKIN: Warm and dry no statis dermatitis or ulcers   NEURO / PSYCH: Oriented to person, place and time. Speech clear and appropriate. Follows all commands. Pleasant affect        Intake/Output Summary (Last 24 hours) at 12/11/2020 0933  Last data filed at 12/11/2020 0827  Gross per 24 hour   Intake 1080 ml   Output --   Net 1080 ml       Weight:   Wt Readings from Last 3 Encounters:   12/11/20 281 lb 9.6 oz (127.7 kg)     Current Inpatient Medications:  ??? losartan  50 mg Oral Daily   ??? insulin lispro  0-6 Units SubCUTAneous TID WC   ??? insulin lispro  0-3 Units SubCUTAneous Nightly   ??? metoprolol succinate  50 mg Oral BID   ??? predniSONE  50 mg Oral Once    Followed by   ??? [START ON 12/12/2020] predniSONE  50 mg Oral Once    Followed by   ??? [START ON 12/12/2020] predniSONE  50 mg Oral Once    Followed by   ??? [START ON 12/12/2020] diphenhydrAMINE  50 mg Oral Once   ??? enoxaparin  40 mg SubCUTAneous Daily   ??? sodium chloride flush  5-40 mL IntraVENous 2 times per day   ??? aspirin  81 mg Oral Daily   ??? atorvastatin  40 mg Oral Nightly   ??? sodium chloride flush  10 mL IntraVENous Once       IV Infusions (if  any):  ??? dextrose     ??? sodium chloride         DIAGNOSTIC/ LABORATORY DATA:  Labs:   CBC:   Recent Labs     12/09/20  0546 12/11/20  0516   WBC 6.9 7.4   HGB 14.5 14.6   HCT 43.6 43.9   PLT 263 278     BMP:   Recent Labs     12/10/20  0652 12/11/20  0516   NA 141 138   K 4.0 4.1   CO2 25 26   BUN 11 12   CREATININE 1.1 1.1   LABGLOM >60 >60   CALCIUM 8.7 9.0       HgA1c:   Lab Results   Component Value Date    LABA1C 7.3 (H) 12/06/2020     APTT:  Recent Labs     12/09/20  1240 12/09/20  1944   APTT 34.9 39.4*     FASTING LIPID PANEL:  Lab Results   Component Value Date    CHOL 220 12/06/2020    HDL 33 12/06/2020    LDLCALC 120 12/06/2020    TRIG 334 12/06/2020     LIVER PROFILE:  Recent Labs     12/09/20  0546   AST 14   ALT 26   LABALBU 3.8       CXR 12/05/20:   No acute process.    Pulmonary CTA 12/05/20:  No evidence of pulmonary embolism or acute pulmonary abnormality.    12 lead EKG 12/09/20:  Normal sinus rhythm  Left anterior fascicular block  Nonspecific T wave abnormality  Abnormal  ECG    Coronary CTA ( 12/07/20 ) was attempted but could not be performed due to inability to achieve a low heart rate      Treadmill nuclear stress test 12/08/20:  Exercise component:  1. No chest pain  2. Exercise time: 9:31, MPHR: 81%  3. No new arrhythmias during exercise portion of test --> new onset atrial fibrillation with RVR during recovery phase  4. No EKG changes suggestive of stress induced ischemia  Nuclear results:  1. The myocardial perfusion imaging was normal with soft tissue  attenuation.  2. Gated SPECT left ventricular ejection fraction was calculated to be  57% with normal myocardial wall motion.    Telemetry: SR      ASSESSMENT:   1. Dyspnea on exertion.  2. Paroxysmal AF with RVR.  CHA2DS2-VASc score 2 ( at least )  3. Shellfish allergy - anaphylaxis  4. Mixed HLD ( with low HDL )  5. HTN  6. DM  7. ? OSA per pulmonary --> sleep study as out patient        PLAN:  1. Continue current management  2. Cath +/- PCI Monday 12/12/20  3. To be started on Metformin ( per the primary service ) 48 hours after the cath / dye procedure   4. Will also need to be started on [place] after the cath.   5. IV fluids to start at midnight. NPO after.midnight    Electronically signed by Gilman Buttner, MD on 12/11/2020 at 9:33 AM

## 2020-12-12 ENCOUNTER — Ambulatory Visit: Payer: PRIVATE HEALTH INSURANCE | Primary: Family Medicine

## 2020-12-12 LAB — BASIC METABOLIC PANEL W/ REFLEX TO MG FOR LOW K
Anion Gap: 9 mmol/L (ref 7–16)
BUN: 14 mg/dL (ref 6–20)
CO2: 24 mmol/L (ref 22–29)
Calcium: 9.1 mg/dL (ref 8.6–10.2)
Chloride: 102 mmol/L (ref 98–107)
Creatinine: 1.1 mg/dL (ref 0.7–1.2)
GFR African American: >60
GFR Non-African American: >60 mL/min/{1.73_m2} (ref >=60)
Glucose: 182 mg/dL — ABNORMAL HIGH (ref 74–99)
Potassium reflex Magnesium: 4.7 mmol/L (ref 3.5–5.0)
Sodium: 135 mmol/L (ref 132–146)

## 2020-12-12 LAB — TYPE AND SCREEN
ABO/Rh: O POS
Antibody Screen: NEGATIVE

## 2020-12-12 LAB — CBC
Hematocrit: 44.2 % (ref 37.0–54.0)
Hemoglobin: 14.4 g/dL (ref 12.5–16.5)
MCH: 28.8 pg (ref 26.0–35.0)
MCHC: 32.6 % (ref 32.0–34.5)
MCV: 88.4 fL (ref 80.0–99.9)
MPV: 8.8 fL (ref 7.0–12.0)
Platelets: 285 E9/L (ref 130–450)
RBC: 5 E12/L (ref 3.80–5.80)
RDW: 14.4 fL (ref 11.5–15.0)
WBC: 8.9 E9/L (ref 4.5–11.5)

## 2020-12-12 LAB — POCT GLUCOSE
Meter Glucose: 145 mg/dL — ABNORMAL HIGH (ref 74–99)
Meter Glucose: 224 mg/dL — ABNORMAL HIGH (ref 74–99)

## 2020-12-12 MED ORDER — APIXABAN 5 MG PO TABS
5 MG | Freq: Two times a day (BID) | ORAL | Status: DC
Start: 2020-12-12 — End: 2020-12-12

## 2020-12-12 MED ORDER — ASPIRIN 81 MG PO CHEW
81 | ORAL | Status: AC
Start: 2020-12-12 — End: 2020-12-12

## 2020-12-12 MED ORDER — VERAPAMIL HCL 2.5 MG/ML IV SOLN
2.5 | INTRAVENOUS | Status: AC
Start: 2020-12-12 — End: 2020-12-12

## 2020-12-12 MED ORDER — LIDOCAINE HCL 2 % IJ SOLN
2 | INTRAMUSCULAR | Status: AC
Start: 2020-12-12 — End: 2020-12-12

## 2020-12-12 MED ORDER — HEPARIN SODIUM (PORCINE) 10000 UNIT/ML IJ SOLN
10000 | INTRAMUSCULAR | Status: AC
Start: 2020-12-12 — End: 2020-12-12

## 2020-12-12 MED ORDER — METOPROLOL SUCCINATE ER 50 MG PO TB24
50 MG | ORAL_TABLET | Freq: Two times a day (BID) | ORAL | 0 refills | Status: DC
Start: 2020-12-12 — End: 2021-01-03
  Filled 2020-12-12: qty 60, 30d supply, fill #0

## 2020-12-12 MED ORDER — MIDAZOLAM HCL 2 MG/2ML IJ SOLN
2 | INTRAMUSCULAR | Status: AC
Start: 2020-12-12 — End: 2020-12-12

## 2020-12-12 MED ORDER — FREESTYLE FREEDOM KIT
PACK | Freq: Every day | 0 refills | Status: AC
Start: 2020-12-12 — End: 2022-12-13

## 2020-12-12 MED ORDER — SODIUM CHLORIDE 0.9 % IV SOLN
0.9 % | INTRAVENOUS | Status: DC
Start: 2020-12-12 — End: 2020-12-12
  Administered 2020-12-12: 15:00:00 via INTRAVENOUS

## 2020-12-12 MED ORDER — FENTANYL CITRATE (PF) 100 MCG/2ML IJ SOLN
100 | INTRAMUSCULAR | Status: AC
Start: 2020-12-12 — End: 2020-12-12

## 2020-12-12 MED ORDER — LOSARTAN POTASSIUM 50 MG PO TABS
50 MG | ORAL_TABLET | Freq: Every day | ORAL | 0 refills | Status: DC
Start: 2020-12-12 — End: 2021-01-03
  Filled 2020-12-12: qty 30, 30d supply, fill #0

## 2020-12-12 MED ORDER — HEPARIN 1,000 UNITS IN 250 ML NS IRRIGATION
10000 | INTRAMUSCULAR | Status: AC
Start: 2020-12-12 — End: 2020-12-12

## 2020-12-12 MED ORDER — HEPARIN 10,000 UNITS IN 500 ML NS IRRIGATION
10000 | INTRAMUSCULAR | Status: AC
Start: 2020-12-12 — End: 2020-12-12

## 2020-12-12 MED ORDER — DIPHENHYDRAMINE HCL 25 MG PO TABS
25 | ORAL | Status: AC
Start: 2020-12-12 — End: 2020-12-12

## 2020-12-12 MED ORDER — METFORMIN HCL 1000 MG PO TABS
1000 MG | ORAL_TABLET | Freq: Two times a day (BID) | ORAL | 0 refills | Status: DC
Start: 2020-12-12 — End: 2020-12-15
  Filled 2020-12-12: qty 60, 30d supply, fill #0

## 2020-12-12 MED ORDER — ATORVASTATIN CALCIUM 40 MG PO TABS
40 MG | ORAL_TABLET | Freq: Every evening | ORAL | 0 refills | Status: DC
Start: 2020-12-12 — End: 2021-01-03
  Filled 2020-12-12: qty 30, 30d supply, fill #0

## 2020-12-12 MED ORDER — APIXABAN 5 MG PO TABS
5 MG | ORAL_TABLET | Freq: Two times a day (BID) | ORAL | 0 refills | Status: DC
Start: 2020-12-12 — End: 2021-01-03
  Filled 2020-12-12: qty 60, 30d supply, fill #0

## 2020-12-12 MED FILL — LIDOCAINE HCL 2 % IJ SOLN: 2 % | INTRAMUSCULAR | Qty: 20

## 2020-12-12 MED FILL — NORMAL SALINE FLUSH 0.9 % IV SOLN: 0.9 % | INTRAVENOUS | Qty: 10

## 2020-12-12 MED FILL — HUMALOG 100 UNIT/ML SC SOLN: 100 [IU]/mL | SUBCUTANEOUS | Qty: 2

## 2020-12-12 MED FILL — METOPROLOL SUCCINATE ER 50 MG PO TB24: 50 mg | ORAL | Qty: 1

## 2020-12-12 MED FILL — BANOPHEN 25 MG PO TABS: 25 mg | ORAL | Qty: 2

## 2020-12-12 MED FILL — ASPIRIN 81 MG PO CHEW: 81 mg | ORAL | Qty: 1

## 2020-12-12 MED FILL — ATORVASTATIN CALCIUM 40 MG PO TABS: 40 mg | ORAL | Qty: 1

## 2020-12-12 MED FILL — HEPARIN 10,000 UNITS IN 500 ML NS IRRIGATION: 10000 [IU]/mL | INTRAMUSCULAR | Qty: 500

## 2020-12-12 MED FILL — HUMALOG 100 UNIT/ML SC SOLN: 100 [IU]/mL | SUBCUTANEOUS | Qty: 1

## 2020-12-12 MED FILL — HEPARIN 1,000 UNITS IN 250 ML NS IRRIGATION: 10000 [IU]/mL | INTRAMUSCULAR | Qty: 250

## 2020-12-12 MED FILL — VERAPAMIL HCL 2.5 MG/ML IV SOLN: 2.5 mg/mL | INTRAVENOUS | Qty: 2

## 2020-12-12 MED FILL — HEPARIN SODIUM (PORCINE) 10000 UNIT/ML IJ SOLN: 10000 [IU]/mL | INTRAMUSCULAR | Qty: 1

## 2020-12-12 MED FILL — LOSARTAN POTASSIUM 50 MG PO TABS: 50 mg | ORAL | Qty: 1

## 2020-12-12 MED FILL — MIDAZOLAM HCL 2 MG/2ML IJ SOLN: 2 mg/mL | INTRAMUSCULAR | Qty: 2

## 2020-12-12 MED FILL — FENTANYL CITRATE (PF) 100 MCG/2ML IJ SOLN: 100 MCG/2ML | INTRAMUSCULAR | Qty: 2

## 2020-12-12 NOTE — Progress Notes (Signed)
Dara Lords, M.D.,FCCP  Marlis Edelson, D.O., F.A.C.O.I., [facility]  Alesia Banda, M.D.  Arnoldo Hooker ,M.D.   Earnestine Leys, D.O.          Daily Pulmonary Progress Note    Patient:  Thomas Walton 49 y.o. male MRN: [id number]     Date of Service: 12/12/2020      Synopsis     We are following patient for dyspnea with exertion    "CC" shortness of breath    Code status: Full      Subjective      Patient was seen and examined.  Recent return from left heart cath.  We will set patient up for home sleep study.  PFTs ordered, may be completed as outpatient.      Review of Systems:  Constitutional: Denies fever, weight loss, night sweats, and fatigue  Skin: Denies pigmentation, dark lesions, and rashes   HEENT: Denies hearing loss, tinnitus, ear drainage, epistaxis, sore throat, and hoarseness.  Cardiovascular: Denies palpitations, chest pain, and chest pressure.  Respiratory: Denies cough, dyspnea at rest, hemoptysis, apnea, and choking.  Gastrointestinal: Denies nausea, vomiting, poor appetite, diarrhea, heartburn or reflux  Genitourinary: Denies dysuria, frequency, urgency or hematuria  Musculoskeletal: Denies myalgias, muscle weakness, and bone pain  Neurological: Denies dizziness, vertigo, headache, and focal weakness  Psychological: Denies anxiety and depression  Endocrine: Denies heat intolerance and cold intolerance  Hematopoietic/Lymphatic: Denies bleeding problems and blood transfusions    24-hour events:  None    Objective   Vitals: BP 130/68    Pulse 75    Temp 97.5 ??F (36.4 ??C) (Temporal)    Resp 16    Ht 6' 2.5" (1.892 m)    Wt 282 lb (127.9 kg)    SpO2 96%    BMI 35.72 kg/m??     I/O:    Intake/Output Summary (Last 24 hours) at 12/12/2020 1255  Last data filed at 12/12/2020 1104  Gross per 24 hour   Intake 1647 ml   Output 425 ml   Net 1222 ml       Vent Information  SpO2: 96 %                CURRENT MEDS :  Scheduled Meds:   [START ON 12/13/2020] apixaban  5 mg Oral BID    losartan  50 mg Oral Daily     insulin lispro  0-6 Units SubCUTAneous TID WC    insulin lispro  0-3 Units SubCUTAneous Nightly    metoprolol succinate  50 mg Oral BID    enoxaparin  40 mg SubCUTAneous Daily    sodium chloride flush  5-40 mL IntraVENous 2 times per day    atorvastatin  40 mg Oral Nightly    sodium chloride flush  10 mL IntraVENous Once       Physical Exam:  General Appearance: appears comfortable in no acute distress.   HEENT: Normocephalic atraumatic without obvious abnormality   Neck: Lips, mucosa, and tongue normal.  Supple, symmetrical, trachea midline, no adenopathy;thyroid:  no enlargement/tenderness/nodules or JVD.  Lung: Breath sounds CTA. Respirations   unlabored. Symmetrical expansion.  Heart: RRR, normal S1, S2. No MRG  Abdomen: Soft, NT, ND. BS present x 4 quadrants. No bruit or organomegaly.   Extremities: Pedal pulses 2+ symmetric b/l.  Extremities normal, no cyanosis, clubbing, or edema.   Musculokeletal: No joint swelling, no muscle tenderness. ROM normal in all joints of extremities.   Neurologic: Mental status: Alert and Oriented X3 .  Pertinent/ New Labs and Imaging Studies     Imaging Personally Reviewed:      FINDINGS:   The lungs are without acute focal process.  There is no effusion or   pneumothorax. The cardiomediastinal silhouette is without acute process. The   osseous structures are without acute process.           Impression   No acute process.         ECHO  Left heart catheterization 12/12/2020  CONCLUSIONS:  1.  Patent coronary arteries.  2.  Normal left ventricular size and function.       Labs:  Lab Results   Component Value Date    WBC 8.9 12/12/2020    HGB 14.4 12/12/2020    HCT 44.2 12/12/2020    MCV 88.4 12/12/2020    MCH 28.8 12/12/2020    MCHC 32.6 12/12/2020    RDW 14.4 12/12/2020    PLT 285 12/12/2020    MPV 8.8 12/12/2020     Lab Results   Component Value Date    NA 135 12/12/2020    K 4.7 12/12/2020    CL 102 12/12/2020    CO2 24 12/12/2020    BUN 14 12/12/2020    CREATININE 1.1  12/12/2020    LABALBU 3.8 12/09/2020    CALCIUM 9.1 12/12/2020    GFRAA >60 12/12/2020    LABGLOM >60 12/12/2020     No results found for: PROTIME, INR  No results for input(s): PROBNP in the last 72 hours.  No results for input(s): PROCAL in the last 72 hours.  This SmartLink has not been configured with any valid records.       Micro:  No results for input(s): CULTRESP in the last 72 hours.  No results for input(s): LABGRAM in the last 72 hours.  No results for input(s): LEGUR in the last 72 hours.  No results for input(s): STREPNEUMAGU in the last 72 hours.  No results for input(s): LP1UAG in the last 72 hours.       Assessment:    Dyspnea on exertion  New onset atrial fibrillation with RVR  Obesity with BMI 35.72  Likely underlying OSA, daytime hypersomnia        Plan:   Albuterol as needed  Cardiac work-up ongoing.  Stress test and left heart cath negative  DVT prophylaxis  Outpatient home sleep study  PFTs ordered.  Okay to discharge from a pulmonary perspective we will arrange follow-up through our office upon discharge      This plan of care was reviewed in collaboration with Dr. Lamarr Lulas  Electronically signed by Robyn Haber, APRN - CNP on 12/12/2020 at 12:55 PM      I personally saw, examined, and cared for the patient. Labs, medications, radiographs reviewed. I agree with history exam and plans detailed in NP note.   Clinton Gallant, MD

## 2020-12-12 NOTE — Progress Notes (Signed)
Inpatient Cardiology Progress note     PATIENT IS BEING FOLLOWED FOR: DOE    Thomas Walton is a 49 y.o. male sen in consultation this admission by Dr. Joen Laura     SUBJECTIVE: no problems overnight. Denies CP or SOB  OBJECTIVE: No apparent distress     ROS:  Consist: Denies fevers, chills or night sweats  Heart: Denies chest pain, palpitations, lightheadedness, dizziness or syncope  Lungs: Denies SOB, cough, wheezing, orthopnea or PND  GI: Denies abdominal pain, vomiting or diarrhea    PHYSICAL EXAM:   BP (!) 133/90    Pulse 72    Temp 97.5 ??F (36.4 ??C) (Temporal)    Resp 16    Ht 6' 2.5" (1.892 m)    Wt 282 lb (127.9 kg)    SpO2 96%    BMI 35.72 kg/m??    B/P Range last 24 hours: Systolic (24hrs), Avg:141 , Min:133 , Max:152    Diastolic (24hrs), Avg:87, Min:72, Max:92    CONST: Well developed, well nourished AA male who appears of stated age. Awake, alert and cooperative. No apparent distress  HEENT:   Head- Normocephalic, atraumatic   Eyes- Conjunctivae pink, anicteric  Throat- Oral mucosa pink and moist  Neck-  No stridor, trachea midline, no jugular venous distention. No carotid bruit  CHEST: Chest symmetrical and non-tender to palpation. No accessory muscle use or intercostal retractions  RESPIRATORY:  Lung sounds - clear throughout fields   CARDIOVASCULAR:     Heart Inspection- shows no noted pulsations  Heart Palpation- no heaves or thrills; PMI is non-displaced   Heart Ausculation- Regular rate and rhythm, no murmur. No s3, s4 or rub   PV: No lower extremity edema. No varicosities. Pedal pulses palpable, no clubbing or cyanosis   ABDOMEN: Soft, non-tender to light palpation. Bowel sounds present. No palpable masses no organomegaly; no abdominal bruit  MS: Good muscle strength and tone. No atrophy or abnormal movements.   GU: Deferred  SKIN: Warm and dry no statis dermatitis or ulcers   NEURO / PSYCH: Oriented to person, place and time. Speech clear and appropriate. Follows all commands. Pleasant affect        Intake/Output Summary (Last 24 hours) at 12/12/2020 0759  Last data filed at 12/12/2020 0615  Gross per 24 hour   Intake 2127 ml   Output --   Net 2127 ml       Weight:   Wt Readings from Last 3 Encounters:   12/12/20 282 lb (127.9 kg)     Current Inpatient Medications:  ??? losartan  50 mg Oral Daily   ??? insulin lispro  0-6 Units SubCUTAneous TID WC   ??? insulin lispro  0-3 Units SubCUTAneous Nightly   ??? metoprolol succinate  50 mg Oral BID   ??? predniSONE  50 mg Oral Once    Followed by   ??? diphenhydrAMINE  50 mg Oral Once   ??? enoxaparin  40 mg SubCUTAneous Daily   ??? sodium chloride flush  5-40 mL IntraVENous 2 times per day   ??? aspirin  81 mg Oral Daily   ??? atorvastatin  40 mg Oral Nightly   ??? sodium chloride flush  10 mL IntraVENous Once       IV Infusions (if any):  ??? sodium chloride 100 mL/hr at 12/11/20 2358   ??? dextrose     ??? sodium chloride         DIAGNOSTIC/ LABORATORY DATA:  Labs:   CBC:   Recent  Labs     12/11/20  0516 12/12/20  0435   WBC 7.4 8.9   HGB 14.6 14.4   HCT 43.9 44.2   PLT 278 285     BMP:   Recent Labs     12/11/20  0516 12/12/20  0435   NA 138 135   K 4.1 4.7   CO2 26 24   BUN 12 14   CREATININE 1.1 1.1   LABGLOM >60 >60   CALCIUM 9.0 9.1       HgA1c:   Lab Results   Component Value Date    LABA1C 7.3 (H) 12/06/2020     APTT:  Recent Labs     12/09/20  1240 12/09/20  1944   APTT 34.9 39.4*     FASTING LIPID PANEL:  Lab Results   Component Value Date    CHOL 220 12/06/2020    HDL 33 12/06/2020    LDLCALC 120 12/06/2020    TRIG 334 12/06/2020     LIVER PROFILE:  No results for input(s): AST, ALT, LABALBU in the last 72 hours.    CXR 12/05/20:   No acute process.    Pulmonary CTA 12/05/20:  No evidence of pulmonary embolism or acute pulmonary abnormality.    12 lead EKG 12/09/20:  Normal sinus rhythm  Left anterior fascicular block  Nonspecific T wave abnormality  Abnormal ECG    Coronary CTA ( 12/07/20 ) was attempted but could not be performed due to inability to achieve a low heart  rate      Treadmill nuclear stress test 12/08/20:  Exercise component:  1. No chest pain  2. Exercise time: 9:31, MPHR: 81%  3. No new arrhythmias during exercise portion of test --> new onset atrial fibrillation with RVR during recovery phase  4. No EKG changes suggestive of stress induced ischemia  Nuclear results:  1. The myocardial perfusion imaging was normal with soft tissue  attenuation.  2. Gated SPECT left ventricular ejection fraction was calculated to be  57% with normal myocardial wall motion.    Telemetry: SR      ASSESSMENT:   1. Dyspnea on exertion.  2. Paroxysmal AF with RVR.  CHA2DS2-VASc score 2 ( at least )  3. Shellfish allergy - anaphylaxis  4. Mixed HLD ( with low HDL )  5. HTN  6. DM  7. ? OSA per pulmonary --> sleep study as out patient        PLAN:  1. Continue current management  2. Cath +/- PCI this am  3. Further recommendations to follow    Electronically signed by Gilman Buttner, MD on 12/12/2020 at 7:59 AM

## 2020-12-12 NOTE — Op Note (Signed)
Operative Note      Patient: Thomas Walton  Date of Birth: 11-05-1971  MRN: [id number]    Date of Procedure:  12/12/20    Indication:  1. DOE  2. AUC score: 7  3. AUC indication: 21    Procedure: Left Heart Catheterization, coronary angiography, left ventriculography    Anesthesia: Versed, Fentanyl  Time sedation was administered: 08:39. I was present in the room when sedation was administered.   Procedure end time: 08:55  Time spent with face to face monitoring of moderate sedation: 31 minutes    LHC performed via right radial approach using a 6 F sheath.   2.5mg  of diluted Verapamil and of nitroglycerine administered through the sheath. 3000 U heparin administered IV.     Findings:  Left main: 0%  stenosis  LAD: 0. %  stenosis  Circumflex: 0. %   stenosis  RCA: Dominant.  0. %  stenosis  LV angio: 60%  ejection fraction    Hemodynamics:  LV: 117 mmHg. EDP: 4 No gradient across AV.   Ao: 126/80 ( 99 ).     Sheath removed and TR band applied. There was good hemostasis achieved and the distal pulses were intact.     Complication: None   Estimated blood loss: 10 cc  Contrast use: 80 cc    Post op diagnosis:  Patent coronaries  Normal EF  Normal LVedp    PLAN:  Risk factor modification  Start Eliquis ( tomorrow am ) [ for Afib ]  May be discharged later today ( from cardiology stand point )      Electronically signed by Gilman Buttner, MD on 12/12/2020 at 9:06 AM

## 2020-12-12 NOTE — Procedures (Signed)
[facility]                  [address] [place], [place] [address]                            CARDIAC CATHETERIZATION    PATIENT NAME: Thomas Walton, Thomas Walton                      DOB:        [birth year]  MED REC NO:   [id number]                            ROOM:       6322  ACCOUNT NO:   [id number]                           ADMIT DATE: 12/05/2020  PROVIDER:     Truddie Crumble, MD    DATE OF PROCEDURE:  12/12/2020    PROCEDURE:  Left heart catheterization, selective coronary angiography  and left ventriculography.    The procedure was done through right radial approach using ultrasound  guidance.    The patient received intravenous Versed and intravenous fentanyl for  sedation.    INDICATION:  Exertional dyspnea.  Multiple risk factors for coronary  artery disease.    PRESSURES:  Aorta 126/80 with a mean of 99.  Left ventricular systolic pressure 117.  Left ventricular end-diastolic  pressure 4.  There was no significant gradient across the aortic valve on pullback.    CORONARY ANGIOGRAPHY:  Left main:  The left main artery did not appear to have any significant  angiographic disease.    LAD:  The left anterior descending artery is a large vessel that wraps  around the apical third to one half of the inferior wall.  The LAD and  its branches did not appear to have any significant angiographic  disease.    LCX:  The left circumflex is a large, but nondominant vessel which did  not appear to have any significant angiographic disease.    RCA:  The right coronary artery is a dominant vessel providing the  posterior descending artery branch.  The right coronary artery did not  appear to have any seen significant angiographic disease.    LEFT VENTRICULOGRAPHY:  The left ventricle is normal in size and  contractility with an estimated ejection fraction of 60%.  There was no  mitral regurgitation noted.    The right radial arterial sheath was removed at the end of the  procedure, and a TR  band was applied with adequate hemostasis and with  preservation of pulse.    The patient tolerated the procedure well and left the cardiac  catheterization laboratory in stable condition.          CONCLUSIONS:  1.  Patent coronary arteries.  2.  Normal left ventricular size and function.        Truddie Crumble, MD    D: 12/12/2020 9:15:49       T: 12/12/2020 9:18:06     [email or web address]  Job#: [id number]     Doc#: [id number]    CC:

## 2020-12-12 NOTE — Progress Notes (Signed)
Sent letter for pt to call and schedule ovhp.   Received 12.13.2021    needs home sleep study set up ??, needs possible pfts if unable to do while in hospital. Will dc soon likely later today. Can follow up with karen or Dr B ??after testing completed

## 2020-12-12 NOTE — Discharge Summary (Signed)
Discharge Summary    Admit date: 12/05/2020    Discharge date and time: No discharge date for patient encounter.     Admitting Physician: Tilden Fossa, DO     Consultants: cardio, pulm    Admission Diagnoses:  Dyspnea on exertion    Discharge Diagnoses AND Hospital Course:  ?? Dyspnea on exertion- proBNP 12. D-dimer 377, CTA negative for PE. Aspirin/statin. History of asthma. Underwent heart catheterization 12/13 which was clean. SOB likely due to intermittent afib with RVR. Will be discharged on eliquis and metoprolol. Follow up with cardio/pulm/pcp. Will need sleep study, set up by pulm  ?? Chest discomfort- with abnormal EKG. Trops negative thus far. Unabel to obtain coronary CTA due to HR. Underwent stress test which was normal however he went into afib RVR. Underwent heart catheterization 12/13 which was clean.  ?? New onset afib RVR- started on digoxin and lovenox after stress, then he converted to NSR so dig was stopped. Now on metoprolol, eliquis to start tomrorow  ?? Hyperglycemia-   hemoglobin A1c. 7.3 start metformin after 48 hours post IV dye  ?? Asthma  ?? Obesity- BMI 35. Likely with OSA. Will need PSG on discharge, set up by pulm      Discharge Exam:  Vitals:    12/12/20 1108   BP: 130/68   Pulse: 75   Resp:    Temp:    SpO2:        General appearance:  awake, alert, and oriented to person, place, time, and purpose; appears stated age and cooperative; no apparent distress no labored breathing  HEENT:  Conjunctivae/corneas clear.   Neck: Supple. No jugular venous distention.   Respiratory: symmetrical; clear to auscultation bilaterally; no wheezes; no rhonchi; no rales  Cardiovascular: rhythm regular; rate controlled; no murmurs  Abdomen: Soft, nontender, nondistended  Extremities:  peripheral pulses present; no peripheral edema; no ulcers  Musculoskeletal: No clubbing, cyanosis, no bilateral lower extremity edema. Brisk capillary refill.   Skin:  No rashes  on visible skin  Neurologic: awake, alert  and following commands     Disposition: home  The patient's condition is fair.  At this time the patient is without objective evidence of an acute process requiring continuing hospitalization or inpatient management.  They are stable for discharge with outpatient follow-up.     I have spoken with the patient and discussed the results of the current hospitalization, in addition to providing specific details for the plan of care and counseling regarding the diagnosis and prognosis.  The plan has been discussed in detail and they are aware of the specific conditions for emergent return, as well as the importance of follow-up.  Their questions are answered at this time and they are agreeable with the plan for discharge to home     Patient Instructions: Follow up with PCP, cardiology, pulm as directed  Future Appointments   Date Time Provider Department Center   12/12/2020  3:00 PM [facility] 01 [facility]   12/19/2020  1:30 PM [facility] 01 [facility]       Discharge Medications:     Medication List      START taking these medications    apixaban 5 MG Tabs tablet  Commonly known as: ELIQUIS  Take 1 tablet by mouth 2 times daily  Start taking on: December 13, 2020     atorvastatin 40 MG tablet  Commonly known as: LIPITOR  Take 1 tablet by mouth nightly  losartan 50 MG tablet  Commonly known as: COZAAR  Take 1 tablet by mouth daily  Start taking on: December 13, 2020     metFORMIN 1000 MG tablet  Commonly known as: GLUCOPHAGE  Take 1 tablet by mouth 2 times daily (with meals)  Start taking on: December 14, 2020     metoprolol succinate 50 MG extended release tablet  Commonly known as: TOPROL XL  Take 1 tablet by mouth 2 times daily        CONTINUE taking these medications    ALBUTEROL SULFATE (SENSOR) IN           Where to Get Your Medications      These medications were sent to [facility] - [place], [place] - [address] - [place] [phone] - F [phone]  [address],  [place] [address]    Phone: [phone]   ?? apixaban 5 MG Tabs tablet  ?? atorvastatin 40 MG tablet  ?? losartan 50 MG tablet  ?? metFORMIN 1000 MG tablet  ?? metoprolol succinate 50 MG extended release tablet         Activity: activity as tolerated    Diet: diabetic diet    Wound Care: none needed    Follow-up:     ?? This patient is instructed to follow-up with his primary care physician.  ?? Patient is instructed to follow-up with the consults listed above as directed by them.  ?? They are instructed to resume home medications and take new medications as indicated in the list above.  ?? If the patient has a recurrence of symptoms, they are instructed to go to the ED.    Preparing for this patient's discharge, including paperwork, orders, instructions, and meeting with patient did require > 30 minutes.    Greer Pickerel, DO   11:37 AM  12/12/2020

## 2020-12-12 NOTE — Care Coordination-Inpatient (Signed)
Met with pt and his fiance, Lattie Haw, in room earlier today. S/p cardiac cath. Discharge plan is to return home. Lattie Haw inquired about talking to diabetic educator. Voiced no other needs at present. Pt's nurse notified.

## 2020-12-12 NOTE — Progress Notes (Signed)
Weaned and assessed right radial vasc band per order. No hematoma or bleeding.

## 2020-12-12 NOTE — Progress Notes (Signed)
CLINICAL PHARMACY NOTE: MEDS TO BEDS    Total # of Prescriptions Filled: 5   The following medications were delivered to the patient:  ?? eliquis 5mg   ?? Losartan potassium 50mg   ?? lipitor 40mg   ?? Metoprolol succ er 50mg   ?? Metformin 1000mg     Additional Documentation:    Delivered to patient 12/13 @1 :52pm

## 2020-12-12 NOTE — Progress Notes (Signed)
Reason for consult: Newly Dx DM    A1C:  7.3% (12/06/20)    []  Not available                 Patient states the following concerns/barriers to diabetes self-management:       [x]  None       []  Medication cost   []  Food cost/availability          []  Reading  []  Hearing   []  Vision                  []  Work    []  Transportation  []  No insurance    []  Physical limitations    []  Other:              Diabetes survival packet provided to:   [x]  Patient     [x]  Other:    Information reviewed:   Definition of diabetes   Target glucose ranges/A1C   Self-monitoring of blood glucose   Prevention/symptoms/treatment of hypo-/hyperglycemia   Medication adherence   The plate method/meal planning guidelines   The benefits of exercise and recommendations   Reducing the risk of chronic complications    Patient states the following:  [x]   Drinks regular soda, fruit juices and water  [x]   Eats 1 meal /day - dinner- most consistently   [x]   Performs walking for activity 3-5 miles/day - with work     Alternative beverages to help with glycemic control reviewed. Diabetes plate method for meal planning encouraged. Benefits of physical activity or increase activities of daily living on blood glucose control provided. Patient states that he is to begin self monitoring. Instructed on target levels for fasting, before and after meal targets. Diabetes medications, mechanism of action, timing and side effects reviewed- Metformin. Patient verbalizes understanding of teaching at this time. Contact information provided for future questions and concerns.    Patient would likely benefit from [facility] classes after discharge.      Evaluation/Plan/Recommendations:   Patient's understanding of diabetes:   [x] Poor   [] Fair    [] Good   []  Excellent     Outpatient diabetes education is recommended:   [x]  Yes  []  No   []  As needed  Patient is interested in outpatient diabetes education:   []  Yes    []  No    [x]  Unsure    Recommended:     []  Consult to social  work; patient has no insurance or financial hardship      [x]  Script for glucometer and supplies (per preference of patient's insurance)               [x]  Script for outpatient diabetes education classes from PCP    [x]  Carbohydrate-controlled diet    []  Patient prefers/educator recommends insulin pens instead of syringes     (if insulin ordered for home use)    Thank you for this consult.

## 2020-12-13 NOTE — Telephone Encounter (Signed)
Call returned patient  told per Dr. Joen Laura ok to return to work all questions answered to patients satisfaction

## 2020-12-13 NOTE — Telephone Encounter (Signed)
Spoke to [facility]  follow up scheduled 01-10-21  is patient ok to return to work this week from a Cardiac standpoint ?    Please advise     Scheduled to see Dr. Burley Saver) 12-16

## 2020-12-15 ENCOUNTER — Ambulatory Visit
Admit: 2020-12-15 | Discharge: 2020-12-15 | Payer: PRIVATE HEALTH INSURANCE | Attending: Family Medicine | Primary: Family Medicine

## 2020-12-15 DIAGNOSIS — E119 Type 2 diabetes mellitus without complications: Secondary | ICD-10-CM

## 2020-12-15 NOTE — Assessment & Plan Note (Signed)
Unclear control, continue current medications has follow-up with cardiology, not currently in A. fib at this visit, encouraged him to continue his medications, thoroughly explained beta-blocker and anticoagulation for prevention of stroke and cardiovascular health, encouraged close follow-up with cardiology

## 2020-12-15 NOTE — Assessment & Plan Note (Signed)
Well-controlled, continue current medications patient with new diagnosis of obstructive sleep apnea, type 2 diabetes, paroxysmal A. fib, he did have negative stress test and normal cardiac cath well in the hospital, currently taking all of his medications except Metformin, denies any side effects, he is feeling well and appropriate to go back to work, cardiology is also cleared him to return to work, note provided to patient that he may return without restrictions

## 2020-12-15 NOTE — Assessment & Plan Note (Signed)
Well-controlled, continue current medications no signs or symptoms of bleeding, thoroughly discussed purpose of the Eliquis

## 2020-12-15 NOTE — Progress Notes (Signed)
Post-Discharge Transitional Care Management Services or Hospital Follow Up      Thomas Walton   Date of Birth:  [birth year]    Date of Office Visit:  12/15/2020  Date of Hospital Admission: 12/05/20  Date of Hospital Discharge: 12/12/20  Readmission Risk Score(high >=14%. Medium >=10%):Readmission Risk Score: 4.6 ( )      Care management risk score Rising risk (score 2-5) and Complex Care (Scores >=6): 0     Non face to face  following discharge, date last encounter closed (first attempt may have been earlier): *No documented post hospital discharge outreach found in the last 14 days *No documented post hospital discharge outreach found in the last 14 days    Call initiated 2 business days of discharge: *No response recorded in the last 14 days     Patient Active Problem List   Diagnosis   ??? Paroxysmal atrial fibrillation (HCC)   ??? Chronic anticoagulation   ??? Type 2 diabetes mellitus without complication, without long-term current use of insulin ([place])   ??? Obstructive sleep apnea syndrome   ??? Hospital discharge follow-up   ??? Cardiovascular risk factor       Allergies   Allergen Reactions   ??? Shellfish-Derived Products Anaphylaxis     Crab, lobster, shrimp, etc.       Medications listed as ordered at the time of discharge from hospital     Medication List          Accurate as of December 15, 2020  2:48 PM. If you have any questions, ask your nurse or doctor.            CONTINUE taking these medications    ALBUTEROL SULFATE (SENSOR) IN     apixaban 5 MG Tabs tablet  Commonly known as: ELIQUIS  Take 1 tablet by mouth 2 times daily     atorvastatin 40 MG tablet  Commonly known as: LIPITOR  Take 1 tablet by mouth nightly     FreeStyle Lancets Misc     FREESTYLE LITE strip  Generic drug: blood glucose test strips     glucose monitoring kit  1 kit by Does not apply route daily     losartan 50 MG tablet  Commonly known as: COZAAR  Take 1 tablet by mouth daily     metoprolol succinate 50 MG extended release tablet  Commonly  known as: TOPROL XL  Take 1 tablet by mouth 2 times daily        STOP taking these medications    metFORMIN 1000 MG tablet  Commonly known as: GLUCOPHAGE  Stopped by: Gillian Shields, MD              Medications marked "taking" at this time  Outpatient Medications Marked as Taking for the 12/15/20 encounter (Office Visit) with Gillian Shields, MD   Medication Sig Dispense Refill   ??? apixaban (ELIQUIS) 5 MG TABS tablet Take 1 tablet by mouth 2 times daily 60 tablet 0   ??? atorvastatin (LIPITOR) 40 MG tablet Take 1 tablet by mouth nightly 30 tablet 0   ??? losartan (COZAAR) 50 MG tablet Take 1 tablet by mouth daily 30 tablet 0   ??? metoprolol succinate (TOPROL XL) 50 MG extended release tablet Take 1 tablet by mouth 2 times daily 60 tablet 0   ??? glucose monitoring (FREESTYLE FREEDOM) kit 1 kit by Does not apply route daily 1 kit 0   ??? ALBUTEROL SULFATE, SENSOR, IN Inhale into the lungs  Medications patient taking as of now reconciled against medications ordered at time of hospital discharge: Yes    Chief Complaint   Patient presents with   ??? Follow-Up from Hospital       HPI    Inpatient course: Discharge summary reviewed- see chart.    Interval history/Current status:     Started with bronchitis. Sent to ED for chest xray. He was diagnosed with afib, OSA and DM. Had a cath so didn't want metformin to start til today. [name] is going to set up DM education. Does not want to take metformin. Pulmonary doc to set up sleep study and PFT's. Hi sugars have been fasting 100's, had one episode of 77.  Overall feeling well and ready to return to work.    Review of Systems   Constitutional: Negative for chills, fatigue, fever and unexpected weight change.   HENT: Negative for hearing loss.    Eyes: Negative for visual disturbance.   Respiratory: Negative for cough, shortness of breath and wheezing.    Cardiovascular: Negative for chest pain, palpitations and leg swelling.   Gastrointestinal: Negative for abdominal pain, blood in  stool, constipation, diarrhea and nausea.   Genitourinary: Negative for dysuria.   Musculoskeletal: Negative for arthralgias.   Neurological: Negative for weakness, light-headedness, numbness and headaches.   Psychiatric/Behavioral: Negative for dysphoric mood and sleep disturbance. The patient is not nervous/anxious.    All other systems reviewed and are negative.      Vitals:    12/15/20 1406   BP: 127/81   Site: Right Upper Arm   Position: Sitting   Cuff Size: Large Adult   Pulse: 70   Resp: 20   Temp: 96.6 ??F (35.9 ??C)   TempSrc: Temporal   SpO2: 98%   Weight: 283 lb (128.4 kg)   Height: 6' 2.5" (1.892 m)     Body mass index is 35.85 kg/m??.   Wt Readings from Last 3 Encounters:   12/15/20 283 lb (128.4 kg)   12/12/20 282 lb (127.9 kg)     BP Readings from Last 3 Encounters:   12/15/20 127/81   12/12/20 (!) 148/84     The 10-year ASCVD risk score Mikey Bussing DC Jr., et al., 2013) is: 9.8%    Values used to calculate the score:      Age: 49 years      Sex: Male      Is Non-Hispanic African American: Yes      Diabetic: Yes      Tobacco smoker: No      Systolic Blood Pressure: 425 mmHg      Is BP treated: No      HDL Cholesterol: 33 mg/dL      Total Cholesterol: 220 mg/dL        Physical Exam  Constitutional:       General: He is not in acute distress.     Appearance: Normal appearance.   HENT:      Head: Normocephalic and atraumatic.      Right Ear: External ear normal.      Nose: Nose normal.      Mouth/Throat:      Mouth: Mucous membranes are moist.   Eyes:      Extraocular Movements: Extraocular movements intact.      Conjunctiva/sclera: Conjunctivae normal.   Cardiovascular:      Rate and Rhythm: Normal rate and regular rhythm.      Heart sounds: No murmur heard.  Pulmonary:      Effort: Pulmonary effort is normal.      Breath sounds: Normal breath sounds. No wheezing.   Musculoskeletal:         General: Normal range of motion.      Cervical back: Normal range of motion and neck supple.   Lymphadenopathy:       Cervical: No cervical adenopathy.   Neurological:      General: No focal deficit present.      Mental Status: He is alert.   Psychiatric:         Mood and Affect: Mood normal.         Behavior: Behavior normal.             Assessment/Plan:  1. Type 2 diabetes mellitus without complication, without long-term current use of insulin (HCC)  Assessment & Plan:   Borderline controlled, changes made today: Discontinue Metformin Metformin per patient preference A1c of 7.3, fasting sugars have been around 100 in the morning, he did have one low at 77 where he felt ill, drank orange juice and went up to 103, he would like to discontinue Metformin based on side effect profile, would like to trial diet lifestyle changes, discussed healthy plate, planning ahead, and regular exercise, decrease stress at work, follow-up in 3 months for repeat A1c, patient can discontinue checking his sugars at this time unless he is feeling symptomatic  Orders:  -     PR DISCHARGE MEDS RECONCILED W/ CURRENT OUTPATIENT MED LIST  2. Paroxysmal atrial fibrillation [facility])  Assessment & Plan:   Unclear control, continue current medications has follow-up with cardiology, not currently in A. fib at this visit, encouraged him to continue his medications, thoroughly explained beta-blocker and anticoagulation for prevention of stroke and cardiovascular health, encouraged close follow-up with cardiology  3. Chronic anticoagulation  Assessment & Plan:   Well-controlled, continue current medications no signs or symptoms of bleeding, thoroughly discussed purpose of the Eliquis  4. Obstructive sleep apnea syndrome  Assessment & Plan:   Unclear control, Diagnosed by pulmonology during hospitalization, they were going to set up pulmonary function testing and sleep study  5. Hospital discharge follow-up  Assessment & Plan:   Well-controlled, continue current medications patient with new diagnosis of obstructive sleep apnea, type 2 diabetes, paroxysmal A. fib, he did  have negative stress test and normal cardiac cath well in the hospital, currently taking all of his medications except Metformin, denies any side effects, he is feeling well and appropriate to go back to work, cardiology is also cleared him to return to work, note provided to patient that he may return without restrictions  6. Cardiovascular risk factor  Assessment & Plan:   Borderline controlled, continue current medications 9.8%, based on blood pressure and diabetes diagnosis, patient does not smoke, currently on statin, risk reduction through lifestyle management and the statin medication, no current coronary artery disease requiring aspirin therapy      Medical Decision Making: moderate complexity on multiple new medications, recommended follow-up with multiple specialties, care coordination to ensure appropriate treatment and follow-up

## 2020-12-15 NOTE — Assessment & Plan Note (Signed)
Borderline controlled, changes made today: Discontinue Metformin Metformin per patient preference A1c of 7.3, fasting sugars have been around 100 in the morning, he did have one low at 77 where he felt ill, drank orange juice and went up to 103, he would like to discontinue Metformin based on side effect profile, would like to trial diet lifestyle changes, discussed healthy plate, planning ahead, and regular exercise, decrease stress at work, follow-up in 3 months for repeat A1c, patient can discontinue checking his sugars at this time unless he is feeling symptomatic

## 2020-12-15 NOTE — Assessment & Plan Note (Signed)
Borderline controlled, continue current medications 9.8%, based on blood pressure and diabetes diagnosis, patient does not smoke, currently on statin, risk reduction through lifestyle management and the statin medication, no current coronary artery disease requiring aspirin therapy

## 2020-12-15 NOTE — Assessment & Plan Note (Signed)
Unclear control, Diagnosed by pulmonology during hospitalization, they were going to set up pulmonary function testing and sleep study

## 2020-12-16 NOTE — Telephone Encounter (Signed)
Reminded patient of scheduled procedure on  12/20 nstructions given and COVID questionnaire completed.

## 2020-12-19 ENCOUNTER — Ambulatory Visit: Payer: PRIVATE HEALTH INSURANCE | Primary: Family Medicine

## 2021-01-03 MED ORDER — METOPROLOL SUCCINATE ER 50 MG PO TB24
50 MG | ORAL_TABLET | Freq: Two times a day (BID) | ORAL | 0 refills | Status: DC
Start: 2021-01-03 — End: 2021-02-06

## 2021-01-03 MED ORDER — APIXABAN 5 MG PO TABS
5 MG | ORAL_TABLET | Freq: Two times a day (BID) | ORAL | 0 refills | Status: DC
Start: 2021-01-03 — End: 2021-03-03

## 2021-01-03 MED ORDER — ATORVASTATIN CALCIUM 40 MG PO TABS
40 MG | ORAL_TABLET | Freq: Every evening | ORAL | 0 refills | Status: DC
Start: 2021-01-03 — End: 2021-02-06

## 2021-01-03 MED ORDER — LOSARTAN POTASSIUM 50 MG PO TABS
50 MG | ORAL_TABLET | Freq: Every day | ORAL | 0 refills | Status: DC
Start: 2021-01-03 — End: 2021-02-06

## 2021-01-03 NOTE — Telephone Encounter (Signed)
Pt needs the following refills losartan (COZAAR) 50 MG tablet     metoprolol succinate (TOPROL XL) 50 MG extended release tablet    apixaban (ELIQUIS) 5 MG TABS tablet [    atorvastatin (LIPITOR) 40 MG tablet     30 day supply sent to [facility] on [address]

## 2021-01-06 ENCOUNTER — Inpatient Hospital Stay: Payer: PRIVATE HEALTH INSURANCE | Primary: Family Medicine

## 2021-01-09 NOTE — Telephone Encounter (Signed)
Patient's wife notified. Samples are up front for patient wife to collect.

## 2021-01-09 NOTE — Telephone Encounter (Signed)
Pt went to pick up meds and was told apixaban (ELIQUIS) 5 MG TABS tablet      was being denied by insurance.

## 2021-01-09 NOTE — Telephone Encounter (Signed)
Also PA created for eliquis. Waiting on response

## 2021-01-09 NOTE — Telephone Encounter (Signed)
Can we please fill out  prior author what ever we need to fill out to get this covered, do we have samples to give him in the meantime

## 2021-01-09 NOTE — Telephone Encounter (Signed)
Please advise

## 2021-01-10 ENCOUNTER — Ambulatory Visit
Admit: 2021-01-10 | Discharge: 2021-01-10 | Payer: PRIVATE HEALTH INSURANCE | Attending: Cardiovascular Disease | Primary: Family Medicine

## 2021-01-10 DIAGNOSIS — I480 Paroxysmal atrial fibrillation: Secondary | ICD-10-CM

## 2021-01-10 NOTE — Patient Instructions (Signed)
1. Echocardiogram (heart ultrasound)  2. Sleep study  3. TSH (thyroid), lipids and Hemoglobin A1c in March  4. Return in 12 months

## 2021-01-10 NOTE — Progress Notes (Signed)
[facility]   Clinic Note     Date:01/10/21   Patient Name:Thomas Walton  Date of Birth:08-06-1971  Age: 50 y.o.     Primary Care Provider: Gillian Shields, MD    Subjective     This is a very pleasant 50 year old African-American male who presents for hospital follow-up accompanied by his wife.    Since his discharge from the hospital he has made some significant lifestyle changes and is exercising more, eating healthier and trying to lose weight.  He feels his breathing is much better and he has not had any chest discomfort, lower extremity edema, orthopnea, PND, palpitations, syncope, presyncope, claudication.     A focused history review includes:  1. Coronary angiogram 11/2020 essentially normal   2. PAF on apixaban 11/2020, CHA2DS2-VASc score 2  3. Asthma  4. Obesity  5. type 2 diabetes, diet controlled.  Diagnosed at the end of 2021.  6. shellfish allergy-anaphylaxis  7. Never smoker. Rare EtOH, no illicit drugs.  No family history of premature ASCVD      Past History    Past Medical History:         Diagnosis Date   ??? Asthma    ??? Diabetes ([place])    ??? HLD (hyperlipidemia)    ??? HTN (hypertension)    ??? PAF (paroxysmal atrial fibrillation) (HCC)        Past Surgical History:  Past Surgical History:   Procedure Laterality Date   ??? CARDIAC CATHETERIZATION  12/12/2020       Social History:    Social History     Tobacco History     Smoking Status  Never Smoker    Smokeless Tobacco Use  Never Used          Alcohol History     Alcohol Use Status  Yes Comment  rare          Drug Use     Drug Use Status  Never          Sexual Activity     Sexually Active  Not Asked                    Family History:-      Problem Relation Age of Onset   ??? Heart Attack Father          Review of Systems   Negative except as in HPI        Medications     Current Outpatient Medications   Medication Sig Dispense Refill   ??? losartan (COZAAR) 50 MG tablet Take 1 tablet by mouth daily 30 tablet 0    ??? metoprolol succinate (TOPROL XL) 50 MG extended release tablet Take 1 tablet by mouth 2 times daily 60 tablet 0   ??? apixaban (ELIQUIS) 5 MG TABS tablet Take 1 tablet by mouth 2 times daily 60 tablet 0   ??? atorvastatin (LIPITOR) 40 MG tablet Take 1 tablet by mouth nightly 30 tablet 0   ??? ALBUTEROL SULFATE, SENSOR, IN Inhale into the lungs as needed      ??? FREESTYLE LITE strip Inject 1 each into the skin 4 times daily (before meals and nightly)     ??? FreeStyle Lancets MISC Inject 1 each into the skin 4 times daily (before meals and nightly)     ??? glucose monitoring (FREESTYLE FREEDOM) kit 1 kit by Does not apply route daily 1 kit 0  No current facility-administered medications for this visit.           Physical Examination      BP 126/68 (Site: Right Upper Arm, Position: Sitting, Cuff Size: Large Adult)    Pulse 67    Resp 16    Ht 6' 2.5" (1.892 m)    Wt 283 lb 3.2 oz (128.5 kg)    SpO2 98%    BMI 35.87 kg/m??   Body surface area is 2.6 meters squared.     General: No acute distress, appears as stated age, nonicteric  Head: Atraumatic, no gross abnormalities or bruises  Neck: Supple and nontender, no carotid bruits, normal JVP  Lungs: Clear to auscultation bilaterally, no wheezes, rales, or rhonchi  Heart: Regular rate and rhythm, no murmurs, rubs, or gallops  Abdomen: Soft, nontender, nondistended, normal bowel sounds  Extremities: No obvious deformities, no cyanosis, no edema  Neurological: Alert and oriented x3, EOMI, moving all extremities x4  Psychological: Normal mood and affect, cooperative  Skin: Color, texture, and turgor normal for age          Labs/Imaging/Diagnostics   Personally reviewed:    Lab Results   Component Value Date    NA 135 12/12/2020    K 4.7 12/12/2020    CL 102 12/12/2020    CO2 24 12/12/2020    BUN 14 12/12/2020    CREATININE 1.1 12/12/2020    GLUCOSE 182 12/12/2020    CALCIUM 9.1 12/12/2020        Estimated Creatinine Clearance: 119 mL/min (based on SCr of 1.1 mg/dL).    Lab  Results   Component Value Date    WBC 8.9 12/12/2020    HGB 14.4 12/12/2020    HCT 44.2 12/12/2020    MCV 88.4 12/12/2020    PLT 285 12/12/2020       Lab Results   Component Value Date    ALT 26 12/09/2020    AST 14 12/09/2020    ALKPHOS 61 12/09/2020    BILITOT 0.4 12/09/2020       Lab Results   Component Value Date    LABALBU 3.8 12/09/2020       Lab Results   Component Value Date    CHOL 220 (H) 12/06/2020     Lab Results   Component Value Date    TRIG 334 (H) 12/06/2020     Lab Results   Component Value Date    HDL 33 12/06/2020     Lab Results   Component Value Date    LDLCALC 120 (H) 12/06/2020     Lab Results   Component Value Date    LABVLDL 67 12/06/2020         Lab Results   Component Value Date    LABA1C 7.3 (H) 12/06/2020         Personally interpreted the following studies.:  EKG: NSR, LAFB, inferolateral ST-T abnormality     Assessment and Plan:        Pleasant 50 year old African-American male with a history noted above.  He is currently asymptomatic from cardiovascular standpoint.  We discussed the lifelong management of PAF.  It is hard to say whether his PAF was transient secondary to unusual exertion during his stress test or if it was pre-existing.  In either case we discussed the importance of risk factor modification including weight loss, blood pressure control, treatment of sleep apnea if present.     Diagnosis Orders   1. PAF (paroxysmal atrial fibrillation) (  [place])  EKG 12 Lead    ECHO COMPLETE    Baseline diagnostic sleep study   2. Snoring  Baseline diagnostic sleep study   3. Primary hypertension          ??? TTE  ??? Hb A1c and lipid panel in March.  Also TSH which was not checked in the hospital  o If his A1c is not less than 7% in March then I would start him on metformin for cardiovascular disease prevention as well as diabetes  o His target LDL cholesterol is less than 100.  He is on atorvastatin 40 mg daily.  ??? Sleep study given his PAF, body habitus, snoring and apnea documented by the  wife.      ??? Assessment and plan reviewed with the patient/family and their questions and concerns answered in full.    ??? Follow up with me in 12 months.    We appreciate the opportunity to participate in His care!      Benn Moulder, MD, [facility]  Interventional Cardiology/Structural Heart Disease  Office: [phone]  Fax: [phone]  Office Coordinators: Gilmer Mor

## 2021-01-16 ENCOUNTER — Ambulatory Visit: Payer: PRIVATE HEALTH INSURANCE | Primary: Family Medicine

## 2021-01-26 ENCOUNTER — Encounter

## 2021-01-27 ENCOUNTER — Encounter: Attending: Critical Care Medicine | Primary: Family Medicine

## 2021-01-27 MED ORDER — RIVAROXABAN 20 MG PO TABS
20 MG | ORAL_TABLET | Freq: Every day | ORAL | 1 refills | Status: DC
Start: 2021-01-27 — End: 2021-03-03

## 2021-01-27 MED ORDER — RIVAROXABAN 15 & 20 MG PO TBPK
15 & 20 MG | ORAL | 0 refills | Status: DC
Start: 2021-01-27 — End: 2021-03-03

## 2021-01-27 NOTE — Telephone Encounter (Signed)
Patient's wife Misty Stanley was contacted. Misty Stanley states that she did receive a letter from [employer] stating that eliquis  was not covered and that xarelto was covered. Informed patient that RX was sent to pharmacy for xarelto and advised to let us know if anymore problems arise.

## 2021-01-27 NOTE — Telephone Encounter (Signed)
-----   Message from Vickey Huger, MD sent at 01/26/2021 10:01 PM EST -----  Regarding: eliquis denial  Please call patient and let him know it looks like xarelto make covered which is the same med type as eliquis and was sent to pharmacy to see. Please have him check with pharmacy to see if it is covered and if he would like to switch from eliquis to [employer]

## 2021-02-03 NOTE — Telephone Encounter (Signed)
Pt Insurance no longer covers Eliquis and the insurance feels that the Xarelto starter pack should be cancelled and he should only be taking the 20 MG Rx. Please call Shawna Orleans [phone]

## 2021-02-03 NOTE — Telephone Encounter (Signed)
Pt only has 4 days left and they would like someone to contact Patient and explain the new med dosage

## 2021-02-06 MED ORDER — METOPROLOL SUCCINATE ER 50 MG PO TB24
50 MG | ORAL_TABLET | ORAL | 0 refills | Status: DC
Start: 2021-02-06 — End: 2021-03-03

## 2021-02-06 MED ORDER — ATORVASTATIN CALCIUM 40 MG PO TABS
40 MG | ORAL_TABLET | ORAL | 0 refills | Status: DC
Start: 2021-02-06 — End: 2021-03-03

## 2021-02-06 MED ORDER — LOSARTAN POTASSIUM 50 MG PO TABS
50 MG | ORAL_TABLET | Freq: Every day | ORAL | 0 refills | Status: DC
Start: 2021-02-06 — End: 2021-03-03

## 2021-02-06 NOTE — Telephone Encounter (Signed)
Called patient and left message to call back office and let me know when is good time to call him. Patient can take to 20 mg of xerelto once a day instead of the eliquis BID if insurance will cover this medication. Let him know it work the same way just a different brand. His other options is to get samples from the cardiology office of the eliquis.

## 2021-02-10 NOTE — Telephone Encounter (Signed)
Pt called x2 no answer, LVM to return office call, I called pharmacy, they did state patient picked up medication

## 2021-02-10 NOTE — Telephone Encounter (Signed)
I called this patient to make sure that he was able to get the Xarelto I sent in, the message, this is a second time I have left a message in the last week, can you please find out if he is taking that medicine or just getting samples of the Eliquis?

## 2021-02-14 NOTE — Progress Notes (Signed)
Thomas Walton called in to say that she has not heard back from the Sleep Lab to get her husband scheduled for his home sleep study. But since then per Thomas Walton he has seen his Cardiologist and he would like to have the test down in lab.      Thomas Walton is calling to see what we need to do to have it completed in Lab. I checked with Amy and she stated that there is an order in from the Cardiologist already and that the test should be done in Lab due to his A-Fib.     I relayed the info to [place] and provided her with the number for [facility] to make the appt or follow up.     Thomas Walton will call us back once she has a date for his sleep study.

## 2021-02-17 ENCOUNTER — Encounter: Payer: PRIVATE HEALTH INSURANCE | Attending: Family Medicine | Primary: Family Medicine

## 2021-02-22 ENCOUNTER — Encounter

## 2021-03-01 ENCOUNTER — Encounter

## 2021-03-02 ENCOUNTER — Encounter

## 2021-03-03 MED ORDER — LOSARTAN POTASSIUM 50 MG PO TABS
50 MG | ORAL_TABLET | Freq: Every day | ORAL | 2 refills | Status: DC
Start: 2021-03-03 — End: 2021-11-13

## 2021-03-03 MED ORDER — ATORVASTATIN CALCIUM 40 MG PO TABS
40 MG | ORAL_TABLET | Freq: Every evening | ORAL | 2 refills | Status: DC
Start: 2021-03-03 — End: 2022-01-03

## 2021-03-03 MED ORDER — RIVAROXABAN 20 MG PO TABS
20 MG | ORAL_TABLET | Freq: Every day | ORAL | 1 refills | Status: DC
Start: 2021-03-03 — End: 2021-09-01

## 2021-03-03 MED ORDER — METOPROLOL SUCCINATE ER 50 MG PO TB24
50 MG | ORAL_TABLET | Freq: Two times a day (BID) | ORAL | 2 refills | Status: DC
Start: 2021-03-03 — End: 2022-01-03

## 2021-03-06 ENCOUNTER — Inpatient Hospital Stay: Admit: 2021-03-06 | Payer: PRIVATE HEALTH INSURANCE | Primary: Family Medicine

## 2021-03-06 DIAGNOSIS — I480 Paroxysmal atrial fibrillation: Secondary | ICD-10-CM

## 2021-03-06 LAB — ECHOCARDIOGRAM COMPLETE 2D W DOPPLER W COLOR: Left Ventricular Ejection Fraction: 65

## 2021-03-06 NOTE — Telephone Encounter (Signed)
Results given

## 2021-03-06 NOTE — Telephone Encounter (Signed)
-----   Message from Kerby Nora, MD sent at 03/06/2021 10:05 AM EST -----  Completely normal echo

## 2021-03-10 NOTE — Telephone Encounter (Signed)
Theodoro Grist from the sleep lab called sleep study is denied by the patients insurance. Home study would not need approval should be covered  but unsure on  patients out of pocket expense     Can home study be ordered        Please advise

## 2021-03-10 NOTE — Telephone Encounter (Signed)
Home sleep study is okay. It is usually much less OOP but if positive or nonconclusive then a full sleep study is needed.     Kerby Nora, MD

## 2021-03-14 NOTE — Telephone Encounter (Signed)
Pt has paperwork that was filled out will be up front for pt to pick up on day of his appt. 03/15/21. Notified pt

## 2021-03-15 ENCOUNTER — Encounter: Attending: Family Medicine | Primary: Family Medicine

## 2021-03-21 ENCOUNTER — Other Ambulatory Visit: Payer: Self-pay | Admitting: Family Medicine

## 2021-03-21 MED ORDER — ALPRAZOLAM 0.5 MG PO TABS: 0.5000 mg | ORAL_TABLET | Freq: Three times a day (TID) | ORAL | 0 refills | Status: DC | PRN

## 2021-03-22 ENCOUNTER — Encounter

## 2021-03-29 ENCOUNTER — Inpatient Hospital Stay: Payer: PRIVATE HEALTH INSURANCE | Primary: Family Medicine

## 2021-03-30 ENCOUNTER — Encounter: Payer: PRIVATE HEALTH INSURANCE | Attending: Family Medicine | Primary: Family Medicine

## 2021-03-31 ENCOUNTER — Inpatient Hospital Stay: Payer: PRIVATE HEALTH INSURANCE | Primary: Family Medicine

## 2021-04-03 ENCOUNTER — Inpatient Hospital Stay: Admit: 2021-04-03 | Discharge: 2021-04-03 | Payer: PRIVATE HEALTH INSURANCE | Primary: Family Medicine

## 2021-04-03 DIAGNOSIS — G4733 Obstructive sleep apnea (adult) (pediatric): Secondary | ICD-10-CM

## 2021-04-03 NOTE — Progress Notes (Signed)
[facility]                  [address] [place], [place] [address]                               SLEEP STUDY REPORT    PATIENT NAME: Thomas Walton, Thomas Walton                    DOB:        [birth year]  MED REC NO:   [id number]                            ROOM:  ACCOUNT NO:   [id number]                           ADMIT DATE: 04/03/2021  PROVIDER:     Alesia Banda, MD    DATE OF STUDY:  04/03/2021    REFERRING PROVIDER:  Orlando Penner, CNP    An Tawana Scale equipment was used.    INDICATION FOR STUDY:  The patient with known sleep apnea, has witnessed  apneas, awaking gasping for air, loud snoring, restless sleep, driving  drowsy, leg jerks, crawling sensation in legs, heartburn, awaken to  urinate, sleep talking, vivid dreams, trouble falling asleep and racing  thoughts.    The patient's past medical history is significant for hypertension,  anxiety, obesity, diabetes, paroxysmal AFib.  The patient's current  medications include losartan, metoprolol, Eliquis, atorvastatin,  albuterol.  The patient had a neck circumference of 19 inches.  An  Epworth Sleepiness Scale 12 out of 24 and a BMI of 35.87.  I did review  the data on the study and it correlates with the standard criteria.    FINDINGS:  As follows.  Lights off recorded at 08:44:33 p.m. and lights  on was at 05:37:33 a.m.  Total recording time was 533 minutes.    RESPIRATORY EVENTS:  During the study, the patient had 409 apneas, which  of those all were obstructive.  The longest duration of the apneic event  was 71 seconds.  The apnea index was 47.    Hypopnea, during the study the patient had 197 hypopneas.  The longest  duration of the hypopneic event was 75.5 seconds.  The hypopnea index  was 22.6.    The apnea-hypopnea index was 69.6.    OXYGEN SUMMARY:  The patient spent 221.7 minutes with oxygen saturations  below 90%.  The patient's lowest oxygen saturation was 70%.    HEART RATE SUMMARY:  The patient's average  heart rate during the study  was 62.9 beats per minute.    IMPRESSION:  1.  Severe sleep apnea.  2.  Significant nocturnal oxygen desaturations.    RECOMMENDATIONS:  We will recommend an in-lab CPAP titration for this  patient.        Alesia Banda, MD    D: 04/12/2021 17:46:39       T: 04/12/2021 17:49:36     [id number]  Job#: [id number]     Doc#: [id number]    CC:

## 2021-04-11 ENCOUNTER — Ambulatory Visit: Admit: 2021-04-11 | Discharge: 2021-04-11 | Payer: PRIVATE HEALTH INSURANCE | Primary: Family Medicine

## 2021-04-11 ENCOUNTER — Ambulatory Visit
Admit: 2021-04-11 | Discharge: 2021-04-11 | Payer: PRIVATE HEALTH INSURANCE | Attending: Family Medicine | Primary: Family Medicine

## 2021-04-11 DIAGNOSIS — I10 Essential (primary) hypertension: Secondary | ICD-10-CM

## 2021-04-11 DIAGNOSIS — G8929 Other chronic pain: Secondary | ICD-10-CM

## 2021-04-11 LAB — POCT URINALYSIS DIPSTICK
Bilirubin, UA: NEGATIVE
Glucose, UA POC: >=1000
Ketones, UA: NEGATIVE
Leukocytes, UA: NEGATIVE
Nitrite, UA: NEGATIVE
Protein, UA POC: 100
Spec Grav, UA: >=1.03
Urobilinogen, UA: 0.2
pH, UA: 5.5

## 2021-04-11 LAB — POCT MICROALBUMIN
Creatinine Ur POCT: 300
Microalb, Ur: 150

## 2021-04-11 LAB — POCT GLYCOSYLATED HEMOGLOBIN (HGB A1C): Hemoglobin A1C: 8.6 %

## 2021-04-11 MED ORDER — METFORMIN HCL 500 MG PO TABS
500 MG | ORAL_TABLET | Freq: Two times a day (BID) | ORAL | 5 refills | Status: DC
Start: 2021-04-11 — End: 2021-05-04

## 2021-04-11 NOTE — Progress Notes (Signed)
Thomas Walton (DOB:  [birth year]) is a 50 y.o. male, established patient follow up , here for evaluation of the following:  Knee Pain (left knee pain x 1 year, knee will pop out of place), Diabetes, and Hypertension         ASSESSMENT/PLAN      1. Primary hypertension  Chronic, well controlled, continue current medications and treatment plan    2. PAF (paroxysmal atrial fibrillation) (HCC)  Chronic, well controlled, continue current medications and treatment plan following with cardiology, on Xarelto    3. Obstructive sleep apnea syndrome  Has sleep study ordered  4. Type 2 diabetes mellitus without complication, without long-term current use of insulin (HCC)  Chronic, not well controlled, A1c of 8.6, will start Metformin they have at home, 500 mg, twice daily with food, thoroughly discussed lifestyle changes, increasing exercise, wife is frustrated be as she has tried to make changes however he is resistant to eating healthier, he does a lot of stress eating, declines counseling and/or medications for anxiety at this time, will refer to diabetes education when he is ready to make some changes, follow-up in 3 months  -     POCT glycosylated hemoglobin (Hb A1C)  -     POCT Microalbumin  5. Chronic anticoagulation  6. Hematuria, unspecified type  Acute, sample today with glucosuria, protein, and blood, will repeat, may be secondary to the Xarelto no smoking history  -     POCT Urinalysis no Micro  7. Chronic pain of left knee  Chronic, injury 1 year ago, has bone that protrudes medially, on exam no laxity, no joint line tenderness, no effusion, no bone protrusion, will get x-rays, and then refer to Ortho as they have requested  -     XR KNEE LEFT (MIN 4 VIEWS); Future  8. Microalbuminuria  Acute, on one sample, will repeat, likely secondary to diabetes  9. Asymptomatic microscopic hematuria  Acute, on one sample, will repeat, no signs or symptoms of infection at this time, is on Xarelto    Return in about 3 months  (around 07/11/2021) for Diabetes follow up.         Subjective   SUBJECTIVE/OBJECTIVE:  HPI    Injury to left knee 1 year ago fell in rut. Medial bone protrudes and sometime has pain, talked to [place] and said needed xray.     At last visit A1c was 7.3, did not want to start Metformin due to side effects, noted that we would just wait and see what this A1c was, now elevated, does still have a Metformin at home        Declined preventative screening identified as care gaps unless ordered through this visit    PHQ2/PHQ9      No data recorded     Past Medical History:  has a past medical history of Asthma, Diabetes ([place]), HLD (hyperlipidemia), HTN (hypertension), and PAF (paroxysmal atrial fibrillation) ([place]).   Past Surgical History:  has a past surgical history that includes Cardiac catheterization (12/12/2020).  Social History:  reports that he has never smoked. He has never used smokeless tobacco. He reports current alcohol use. He reports that he does not use drugs.  Family History: family history includes Heart Attack in his father.  Allergies: Shellfish-derived products  Medications:   Current Outpatient Medications   Medication Sig Dispense Refill   ??? metFORMIN (GLUCOPHAGE) 500 MG tablet Take 1 tablet by mouth 2 times daily (with meals) 60 tablet 5   ???  atorvastatin (LIPITOR) 40 MG tablet Take 1 tablet by mouth nightly 90 tablet 2   ??? metoprolol succinate (TOPROL XL) 50 MG extended release tablet Take 1 tablet by mouth in the morning and at bedtime 180 tablet 2   ??? losartan (COZAAR) 50 MG tablet Take 1 tablet by mouth daily 90 tablet 2   ??? rivaroxaban (XARELTO) 20 MG TABS tablet Take 1 tablet by mouth daily (with breakfast) 90 tablet 1   ??? FREESTYLE LITE strip Inject 1 each into the skin 4 times daily (before meals and nightly)     ??? FreeStyle Lancets MISC Inject 1 each into the skin 4 times daily (before meals and nightly)     ??? glucose monitoring (FREESTYLE FREEDOM) kit 1 kit by Does not apply route  daily 1 kit 0     No current facility-administered medications for this visit.      Allergies: Shellfish-derived products     Review of Systems   Constitutional: Negative for activity change, appetite change, fatigue, fever and unexpected weight change.   HENT: Negative for trouble swallowing.    Eyes: Negative for visual disturbance.   Respiratory: Negative for shortness of breath.    Cardiovascular: Negative for chest pain.   Gastrointestinal: Negative for abdominal pain.   Musculoskeletal: Negative for arthralgias.   Neurological: Negative for weakness, light-headedness and headaches.   Psychiatric/Behavioral: Negative for confusion and sleep disturbance.   All other systems reviewed and are negative.         Objective   BP 138/85 (Site: Right Upper Arm, Position: Sitting, Cuff Size: Large Adult)    Pulse 75    Temp 96.9 ??F (36.1 ??C) (Temporal)    Resp 18    Ht 6' 3"  (1.905 m)    Wt 290 lb 3.2 oz (131.6 kg)    SpO2 97%    BMI 36.27 kg/m??       Lab Results   Component Value Date    LABA1C 8.6 04/11/2021    LABA1C 7.3 (H) 12/06/2020     Lab Results   Component Value Date    CHOL 220 (H) 12/06/2020     Lab Results   Component Value Date    TRIG 334 (H) 12/06/2020     Lab Results   Component Value Date    HDL 33 12/06/2020     Lab Results   Component Value Date    LDLCALC 120 (H) 12/06/2020     Lab Results   Component Value Date    LABVLDL 67 12/06/2020     No results found for: CHOLHDLRATIO   CREATININE   Date Value Ref Range Status   12/12/2020 1.1 0.7 - 1.2 mg/dL Final   12/11/2020 1.1 0.7 - 1.2 mg/dL Final   12/10/2020 1.1 0.7 - 1.2 mg/dL Final       The 10-year ASCVD risk score Mikey Bussing DC Jr., et al., 2013) is: 18.4%    Values used to calculate the score:      Age: 87 years      Sex: Male      Is Non-Hispanic African American: Yes      Diabetic: Yes      Tobacco smoker: No      Systolic Blood Pressure: 628 mmHg      Is BP treated: Yes      HDL Cholesterol: 33 mg/dL      Total Cholesterol: 220 mg/dL     Physical  Exam  Constitutional:  General: He is not in acute distress.     Appearance: Normal appearance.   HENT:      Head: Normocephalic and atraumatic.      Right Ear: External ear normal.      Left Ear: External ear normal.      Nose: Nose normal.      Mouth/Throat:      Mouth: Mucous membranes are moist.   Eyes:      Extraocular Movements: Extraocular movements intact.      Conjunctiva/sclera: Conjunctivae normal.   Cardiovascular:      Rate and Rhythm: Normal rate and regular rhythm.      Heart sounds: No murmur heard.      Pulmonary:      Effort: Pulmonary effort is normal.      Breath sounds: Normal breath sounds. No wheezing.   Musculoskeletal:         General: Normal range of motion.   Neurological:      General: No focal deficit present.      Mental Status: He is alert.   Psychiatric:         Mood and Affect: Mood normal.         Behavior: Behavior normal.             An electronic signature was used to authenticate this note.    --Gillian Shields, MD       *NOTE: This report was transcribed using voice recognition software. Every effort was made to ensure accuracy; however, inadvertent computerized transcription errors may be present.

## 2021-04-17 ENCOUNTER — Encounter

## 2021-04-17 NOTE — Telephone Encounter (Signed)
Discussed results of sleep study with patient. He will not have an adaptation period. He will need a titration study. Orders faxed to [facility]. Patient also made aware that Thomas Huger, MD would like Dr. Lamarr Lulas to follow him for his sleep apnea so our office will be calling to schedule a consultation.

## 2021-04-17 NOTE — Telephone Encounter (Addendum)
Thomas Walton (wife) is calling for the results of Thomas Walton's home sleep study that was done.     Test was completed about 2 weeks ago.     Please call Thomas Walton.

## 2021-04-30 ENCOUNTER — Inpatient Hospital Stay
Admit: 2021-04-30 | Discharge: 2021-05-01 | Disposition: A | Discharge status: Home / Self Care | Payer: PRIVATE HEALTH INSURANCE | Attending: Emergency Medicine

## 2021-04-30 ENCOUNTER — Emergency Department: Admit: 2021-04-30 | Payer: PRIVATE HEALTH INSURANCE | Primary: Family Medicine

## 2021-04-30 DIAGNOSIS — R5383 Other fatigue: Secondary | ICD-10-CM

## 2021-04-30 LAB — URINALYSIS WITH MICROSCOPIC
Bilirubin Urine: NEGATIVE
Blood, Urine: NEGATIVE
Glucose, Ur: NEGATIVE mg/dL
Leukocyte Esterase, Urine: NEGATIVE
Nitrite, Urine: NEGATIVE
Protein, UA: 30 mg/dL — AB
Specific Gravity, UA: >=1.03 (ref 1.005–1.030)
Urobilinogen, Urine: 1 E.U./dL (ref <2.0)
pH, UA: 6 (ref 5.0–9.0)

## 2021-04-30 LAB — CBC WITH AUTO DIFFERENTIAL
Basophils %: 0.7 % (ref 0.0–2.0)
Basophils Absolute: 0.04 E9/L (ref 0.00–0.20)
Eosinophils %: 2.1 % (ref 0.0–6.0)
Eosinophils Absolute: 0.12 E9/L (ref 0.05–0.50)
Hematocrit: 40.1 % (ref 37.0–54.0)
Hemoglobin: 13.4 g/dL (ref 12.5–16.5)
Immature Granulocytes #: 0.03 E9/L
Immature Granulocytes %: 0.5 % (ref 0.0–5.0)
Lymphocytes %: 42.4 % — ABNORMAL HIGH (ref 20.0–42.0)
Lymphocytes Absolute: 2.46 E9/L (ref 1.50–4.00)
MCH: 28.9 pg (ref 26.0–35.0)
MCHC: 33.4 % (ref 32.0–34.5)
MCV: 86.6 fL (ref 80.0–99.9)
MPV: 8.5 fL (ref 7.0–12.0)
Monocytes %: 8.1 % (ref 2.0–12.0)
Monocytes Absolute: 0.47 E9/L (ref 0.10–0.95)
Neutrophils %: 46.2 % (ref 43.0–80.0)
Neutrophils Absolute: 2.68 E9/L (ref 1.80–7.30)
Platelets: 258 E9/L (ref 130–450)
RBC: 4.63 E12/L (ref 3.80–5.80)
RDW: 13.3 fL (ref 11.5–15.0)
WBC: 5.8 E9/L (ref 4.5–11.5)

## 2021-04-30 LAB — COMPREHENSIVE METABOLIC PANEL W/ REFLEX TO MG FOR LOW K
ALT: 32 U/L (ref 0–40)
AST: 18 U/L (ref 0–39)
Albumin: 4.4 g/dL (ref 3.5–5.2)
Alkaline Phosphatase: 72 U/L (ref 40–129)
Anion Gap: 9 mmol/L (ref 7–16)
BUN: 12 mg/dL (ref 6–20)
CO2: 27 mmol/L (ref 22–29)
Calcium: 9.4 mg/dL (ref 8.6–10.2)
Chloride: 103 mmol/L (ref 98–107)
Creatinine: 1.1 mg/dL (ref 0.7–1.2)
GFR African American: >60
GFR Non-African American: >60 mL/min/{1.73_m2} (ref >=60)
Glucose: 167 mg/dL — ABNORMAL HIGH (ref 74–99)
Potassium reflex Magnesium: 4.1 mmol/L (ref 3.5–5.0)
Sodium: 139 mmol/L (ref 132–146)
Total Bilirubin: 0.3 mg/dL (ref 0.0–1.2)
Total Protein: 7.3 g/dL (ref 6.4–8.3)

## 2021-04-30 LAB — TROPONIN: Troponin, High Sensitivity: 11 ng/L (ref 0–11)

## 2021-04-30 MED ORDER — SODIUM CHLORIDE 0.9 % IV BOLUS
0.9 % | Freq: Once | INTRAVENOUS | Status: AC
Start: 2021-04-30 — End: 2021-04-30
  Administered 2021-04-30: 22:00:00 via INTRAVENOUS

## 2021-04-30 NOTE — Discharge Instructions (Signed)
Follow up with your family physician  If symptoms worsen or concern arises return for re-evaluation

## 2021-04-30 NOTE — ED Provider Notes (Signed)
Thomas DoseDavid L Walton is a 50 y.o. male with a PMHx significant for atrial fibrillation (on Xarelto), DM, HTN who presents for evaluation of fatigue, beginning prior to arrival.  The complaint has been persistent, moderate in severity, and worsened by nothing.   The patient states that recently diagnosed with atrial fibrillation and diabetes a few months ago. He had a negative cardiac cath in December of 2020. Notes over the last month he has felt very fatigued. 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 Denies any other associated symptoms including chest pain, shortness of breath, nausea, vomiting. Just has no energy and "feels off."  Notes over the last month he has been feeling more fatigued. States that he had an at home sleep test for sleep apnea, which he failed. He is supposed to get a machine, however fitting will not take place until July.    The history is provided by the patient and medical records.         Review of Systems   Constitutional: Positive for diaphoresis and fatigue. Negative for chills and fever.   HENT: Negative for postnasal drip, rhinorrhea and sore throat.    Eyes: Negative for visual disturbance.   Respiratory: Negative for cough and shortness of breath.    Cardiovascular: Negative for chest pain.   Gastrointestinal: Negative for abdominal pain, blood in stool, constipation, diarrhea, nausea and vomiting.   Genitourinary: Negative for difficulty urinating, dysuria and urgency.   Musculoskeletal: Negative for back pain.   Skin: Negative for rash.   Neurological: Negative for dizziness, numbness and headaches.        Physical Exam  Vitals and nursing note reviewed.   Constitutional:       General: He is not in acute distress.     Appearance: Normal appearance. He is well-developed. He is not ill-appearing.   HENT:      Head: Normocephalic and atraumatic.      Right Ear: External ear normal.      Left Ear: External ear normal.   Eyes:       General:         Right eye: No discharge.         Left eye: No discharge.      Extraocular Movements: Extraocular movements intact.      Conjunctiva/sclera: Conjunctivae normal.   Cardiovascular:      Rate and Rhythm: Normal rate and regular rhythm.      Heart sounds: Normal heart sounds. No murmur heard.      Pulmonary:      Effort: Pulmonary effort is normal. No respiratory distress.      Breath sounds: Normal breath sounds. No stridor.   Abdominal:      General: There is no distension.      Palpations: Abdomen is soft. There is no mass.      Tenderness: There is no abdominal tenderness.      Hernia: No hernia is present.   Musculoskeletal:         General: No swelling, tenderness or deformity. Normal range of motion.      Cervical back: Normal range of motion and neck supple.   Skin:     General: Skin is warm and dry.      Coloration: Skin is not jaundiced or pale.      Findings: No rash.   Neurological:      General: No focal deficit present.  Mental Status: He is alert and oriented to person, place, and time.      Cranial Nerves: No cranial nerve deficit.      Coordination: Coordination normal.          Procedures     MDM     ED Course as of 05/03/21 2102   Sun Apr 30, 2021   1805 EKG:  This EKG is signed and interpreted by me.    Rate: 65  Rhythm: Sinus  Interpretation: non-specific EKG, no ST elevations or ST depressions, left anterior fascicular block, PR 162, QRS 110, QTc 455, T wave inversions in multiple leads similar compared to prior  Comparison: Nonspecific changing for the prior 01/10/2021 [BB]   2220 Patient reevaluated, lying in bed no acute distress.  Discussed his results and reasons to return.  He will follow-up with his family physician. [BB]      ED Course User Index  [BB] Silas Sacramento, DO      Thomas Walton presents to the ED for evaluation of fatigue.  Patient notes he recently been diagnosed with a bunch of medical issues and has been feeling off for the last month or so. Workup in  the ED revealed Labs near normal limits, trace ketones in urine, likely secondary to dehydration therefore patient was given IVF.  Initial troponin of 14 with repeat of 11 and 10, delta of 1.  Patient not actively in chest pain to the department.  Discussed with him could also be vitamin D or B12 deficiency as well as testosterone at his age.  He is to follow-up with his PCP. Patient continues to be non-toxic on re-evaluation. Findings were discussed with the patient and reasons to immediately return to the ED were articulated to them. They will follow-up with their PCP.  Of note unfortunately at this facility TSH is not run, however it was ordered.    --------------------------------------------- PAST HISTORY ---------------------------------------------  Past Medical History:  has a past medical history of Asthma, Diabetes (HCC), HLD (hyperlipidemia), HTN (hypertension), and PAF (paroxysmal atrial fibrillation) (HCC).    Past Surgical History:  has a past surgical history that includes Cardiac catheterization (12/12/2020).    Social History:  reports that he has never smoked. He has never used smokeless tobacco. He reports current alcohol use. He reports that he does not use drugs.    Family History: family history includes Heart Attack in his father.     The patient???s home medications have been reviewed.    Allergies: Shellfish-derived products    -------------------------------------------------- RESULTS -------------------------------------------------  Labs:  Results for orders placed or performed during the hospital encounter of 04/30/21   CBC with Auto Differential   Result Value Ref Range    WBC 5.8 4.5 - 11.5 E9/L    RBC 4.63 3.80 - 5.80 E12/L    Hemoglobin 13.4 12.5 - 16.5 g/dL    Hematocrit 60.6 30.1 - 54.0 %    MCV 86.6 80.0 - 99.9 fL    MCH 28.9 26.0 - 35.0 pg    MCHC 33.4 32.0 - 34.5 %    RDW 13.3 11.5 - 15.0 fL    Platelets 258 130 - 450 E9/L    MPV 8.5 7.0 - 12.0 fL    Neutrophils % 46.2 43.0 - 80.0  %    Immature Granulocytes % 0.5 0.0 - 5.0 %    Lymphocytes % 42.4 (H) 20.0 - 42.0 %    Monocytes % 8.1 2.0 - 12.0 %  Eosinophils % 2.1 0.0 - 6.0 %    Basophils % 0.7 0.0 - 2.0 %    Neutrophils Absolute 2.68 1.80 - 7.30 E9/L    Immature Granulocytes # 0.03 E9/L    Lymphocytes Absolute 2.46 1.50 - 4.00 E9/L    Monocytes Absolute 0.47 0.10 - 0.95 E9/L    Eosinophils Absolute 0.12 0.05 - 0.50 E9/L    Basophils Absolute 0.04 0.00 - 0.20 E9/L   Comprehensive Metabolic Panel w/ Reflex to MG   Result Value Ref Range    Sodium 139 132 - 146 mmol/L    Potassium reflex Magnesium 4.1 3.5 - 5.0 mmol/L    Chloride 103 98 - 107 mmol/L    CO2 27 22 - 29 mmol/L    Anion Gap 9 7 - 16 mmol/L    Glucose 167 (H) 74 - 99 mg/dL    BUN 12 6 - 20 mg/dL    CREATININE 1.1 0.7 - 1.2 mg/dL    GFR Non-African American >60 >=60 mL/min/1.73    GFR African American >60     Calcium 9.4 8.6 - 10.2 mg/dL    Total Protein 7.3 6.4 - 8.3 g/dL    Albumin 4.4 3.5 - 5.2 g/dL    Total Bilirubin 0.3 0.0 - 1.2 mg/dL    Alkaline Phosphatase 72 40 - 129 U/L    ALT 32 0 - 40 U/L    AST 18 0 - 39 U/L   Troponin   Result Value Ref Range    Troponin, High Sensitivity 11 0 - 11 ng/L   Urinalysis with Microscopic   Result Value Ref Range    Color, UA Yellow Straw/Yellow    Clarity, UA Clear Clear    Glucose, Ur Negative Negative mg/dL    Bilirubin Urine Negative Negative    Ketones, Urine TRACE (A) Negative mg/dL    Specific Gravity, UA >=1.030 1.005 - 1.030    Blood, Urine Negative Negative    pH, UA 6.0 5.0 - 9.0    Protein, UA 30 (A) Negative mg/dL    Urobilinogen, Urine 1.0 <2.0 E.U./dL    Nitrite, Urine Negative Negative    Leukocyte Esterase, Urine Negative Negative    WBC, UA 0-1 0 - 5 /HPF    RBC, UA NONE 0 - 2 /HPF    Bacteria, UA RARE (A) None Seen /HPF    Crystals, UA Rare (A) None Seen /HPF   TSH   Result Value Ref Range    TSH 0.984 0.270 - 4.200 uIU/mL   Troponin   Result Value Ref Range    Troponin, High Sensitivity 14 (H) 0 - 11 ng/L   Troponin    Result Value Ref Range    Troponin, High Sensitivity 10 0 - 11 ng/L   EKG 12 Lead   Result Value Ref Range    Ventricular Rate 65 BPM    Atrial Rate 65 BPM    P-R Interval 162 ms    QRS Duration 110 ms    Q-T Interval 438 ms    QTc Calculation (Bazett) 455 ms    P Axis 39 degrees    R Axis -48 degrees    T Axis -57 degrees       Radiology:  XR CHEST PORTABLE   Final Result   No acute process.             ------------------------- NURSING NOTES AND VITALS REVIEWED ---------------------------  Date / Time Roomed:  04/30/2021  4:15 PM  ED Bed Assignment:  10/10    The nursing notes within the ED encounter and vital signs as below have been reviewed.   BP (!) 144/76    Pulse 71    Temp 98 ??F (36.7 ??C)    Resp 16    Ht 6\' 3"  (1.905 m)    Wt 290 lb (131.5 kg)    SpO2 98%    BMI 36.25 kg/m??   Oxygen Saturation Interpretation: Normal      ------------------------------------------ PROGRESS NOTES ------------------------------------------  9:02 PM EDT  I have spoken with the patient and discussed today???s results, in addition to providing specific details for the plan of care and counseling regarding the diagnosis and prognosis.  Their questions are answered at this time and they are agreeable with the plan. I discussed at length with them reasons for immediate return here for re evaluation. They will followup with their primary care physician by calling their office tomorrow.      --------------------------------- ADDITIONAL PROVIDER NOTES ---------------------------------  At this time the patient is without objective evidence of an acute process requiring hospitalization or inpatient management.  They have remained hemodynamically stable throughout their entire ED visit and are stable for discharge with outpatient follow-up.     The plan has been discussed in detail and they are aware of the specific conditions for emergent return, as well as the importance of follow-up.      Discharge Medication List as of 04/30/2021 10:20  PM          Diagnosis:  1. Fatigue, unspecified type    2. Ketonuria        Disposition:  Patient's disposition: Discharge to home  Patient's condition is stable.    Please note that the above documentation was prepared using voice recognition software. Every attempt was made to ensure accuracy but there may be spelling, grammatical, and contextual errors.           06/30/2021, DO  05/03/21 2104

## 2021-04-30 NOTE — Other (Signed)
Forwarded to primary care physician.

## 2021-05-01 LAB — EKG 12-LEAD
Atrial Rate: 65 {beats}/min
P Axis: 39 degrees
P-R Interval: 162 ms
Q-T Interval: 438 ms
QRS Duration: 110 ms
QTc Calculation (Bazett): 455 ms
R Axis: -48 degrees
T Axis: -57 degrees
Ventricular Rate: 65 {beats}/min

## 2021-05-01 LAB — TSH: TSH: 0.984 u[IU]/mL (ref 0.270–4.200)

## 2021-05-01 LAB — TROPONIN
Troponin, High Sensitivity: 14 ng/L — ABNORMAL HIGH (ref 0–11)
Troponin, High Sensitivity: 10 ng/L (ref 0–11)

## 2021-05-04 ENCOUNTER — Ambulatory Visit
Admit: 2021-05-04 | Discharge: 2021-05-04 | Payer: PRIVATE HEALTH INSURANCE | Attending: Family Medicine | Primary: Family Medicine

## 2021-05-04 ENCOUNTER — Telehealth

## 2021-05-04 DIAGNOSIS — R5383 Other fatigue: Secondary | ICD-10-CM

## 2021-05-04 MED ORDER — METFORMIN HCL ER 500 MG PO TB24
500 MG | ORAL_TABLET | Freq: Every day | ORAL | 5 refills | Status: AC
Start: 2021-05-04 — End: 2021-10-12

## 2021-05-04 MED ORDER — METFORMIN HCL 500 MG PO TABS
500 MG | ORAL_TABLET | Freq: Two times a day (BID) | ORAL | 5 refills | Status: DC
Start: 2021-05-04 — End: 2021-05-04

## 2021-05-04 NOTE — Telephone Encounter (Signed)
-----   Message from Vickey Huger, MD sent at 05/04/2021  3:59 PM EDT -----  Regarding: Sleep study  Please call back his wife and let her know that I did call the [facility], they are unable to move him up, he recommended checking with [facility] in [place] or [facility] in [place] to see if they have more earlier appointments, also noted it takes 10 days about to get a prior authorization through [place] which is part of the problem, if they would like to go to [facility] or [facility] in [place] I can help facilitate a referral if that is what is needed

## 2021-05-04 NOTE — Telephone Encounter (Signed)
Pt wife called and notified please send referral to [facility] in [place]

## 2021-05-04 NOTE — Progress Notes (Signed)
Thomas Walton (DOB:  June 20, 1971) is a 50 y.o. male, established patient follow up , here for evaluation of the following:  Fatigue (Ed follow up )         ASSESSMENT/PLAN      1. Fatigue, unspecified type  Chronic since hospitalization, could be secondary to medications he is on, he is on beta-blocker and Xarelto, he did have stress test and cardiac cath, so less likely cardiac in nature, on most recent ED visit he did have mild bump in troponin, had pulmonary pattern on EKG, will order D-dimer now, if positive will order CT angiography of the chest, is less likely pulmonary emboli, he is on Xarelto, had negative CTA during his initial episode in January, he does have maybe mild asthma with smoke exposure,  echo did not show pulmonary hypertension, thyroid was normal, he is not anemic, MCV within normal limits suggesting against vitamin deficiency, will check testosterone,    As likely cause of fatigue is just poor sleep, working on getting OSA corrected, then can proceed with determining if medications are contributing    -     TESTOSTERONE; Future  2. Type 2 diabetes mellitus without complication, without long-term current use of insulin (HCC)  Chronic, not well controlled, did restart metformin, some diarrhea, will switch to extended release, can consider additional medication in the future, possibly SGLT2 with his heart symptoms  -     metFORMIN (GLUCOPHAGE-XR) 500 MG extended release tablet; Take 1 tablet by mouth daily (with breakfast), Disp-30 tablet, R-5Normal  3. Palpitation  -     D-Dimer, Quantitative; Future  4. Proteinuria, unspecified type  Proteinuria seen during most recent ED visit, will get urinalysis now, can consider culture if it comes back with any bacteria  -     Urinalysis; Future    5. Obstructive sleep apnea syndrome  He had home sleep study, he does have OSA, he needs to go in for his overnight titration study, he is scheduled at [facility] in [place], he cannot be seen until July 17, I did  call [facility] and he let me know that it does take about 10 days to 2 weeks for that no prior authorization, he can put them on a rush list which they are already on, he recommended checking with [facility] in [place] and/or [place] to see if they have earlier appointments, staff is going to call patient and let them know, this is most likely the cause of his fatigue, it may be hard to know how to correct the fatigue until we have fixed obstructive sleep apnea then we can work on possible medication changes      Return in about 2 months (around 07/17/2021) for Diabetes follow up.         Subjective   SUBJECTIVE/OBJECTIVE:  HPI     Was seen in the emergency department for fatigue, gave him IV fluids, suggested follow-up with primary care, did start metformin last visit for A1c of 8.6, CBC okay, no anemia, MCV within normal limits, there was protein in his urine, mild bump in troponin, TSH normal    Does have of diagnosis of OSA- does not have next titration study til July.     Declined preventative screening identified as care gaps unless ordered through this visit    PHQ2/PHQ9      No data recorded     Past Medical History:  has a past medical history of Asthma, Diabetes ([place]), HLD (hyperlipidemia), HTN (hypertension),  and PAF (paroxysmal atrial fibrillation) ([place]).   Past Surgical History:  has a past surgical history that includes Cardiac catheterization (12/12/2020).  Social History:  reports that he has never smoked. He has never used smokeless tobacco. He reports current alcohol use. He reports that he does not use drugs.  Family History: family history includes Heart Attack in his father.  Allergies: Shellfish-derived products  Medications:   Current Outpatient Medications   Medication Sig Dispense Refill   ??? metFORMIN (GLUCOPHAGE-XR) 500 MG extended release tablet Take 1 tablet by mouth daily (with breakfast) 30 tablet 5   ??? atorvastatin (LIPITOR) 40 MG tablet Take 1 tablet by mouth nightly 90  tablet 2   ??? metoprolol succinate (TOPROL XL) 50 MG extended release tablet Take 1 tablet by mouth in the morning and at bedtime 180 tablet 2   ??? losartan (COZAAR) 50 MG tablet Take 1 tablet by mouth daily 90 tablet 2   ??? rivaroxaban (XARELTO) 20 MG TABS tablet Take 1 tablet by mouth daily (with breakfast) 90 tablet 1   ??? FREESTYLE LITE strip Inject 1 each into the skin 4 times daily (before meals and nightly)     ??? FreeStyle Lancets MISC Inject 1 each into the skin 4 times daily (before meals and nightly)     ??? glucose monitoring (FREESTYLE FREEDOM) kit 1 kit by Does not apply route daily 1 kit 0     No current facility-administered medications for this visit.      Allergies: Shellfish-derived products     Review of Systems   Constitutional: Positive for fatigue. Negative for activity change, appetite change, fever and unexpected weight change.   HENT: Negative for trouble swallowing.    Eyes: Negative for visual disturbance.   Respiratory: Negative for shortness of breath.    Cardiovascular: Negative for chest pain.   Gastrointestinal: Negative for abdominal pain.   Musculoskeletal: Negative for arthralgias.   Neurological: Negative for weakness, light-headedness and headaches.   Psychiatric/Behavioral: Negative for confusion and sleep disturbance.   All other systems reviewed and are negative.         Objective   BP 116/70 (Site: Left Upper Arm, Position: Sitting, Cuff Size: Large Adult)    Pulse 80    Temp 97 ??F (36.1 ??C) (Temporal)    Resp 20    Ht 6' 3"  (1.905 m)    Wt 289 lb 6.4 oz (131.3 kg)    SpO2 97%    BMI 36.17 kg/m??       Lab Results   Component Value Date    LABA1C 8.6 04/11/2021    LABA1C 7.3 (H) 12/06/2020     Lab Results   Component Value Date    CHOL 220 (H) 12/06/2020     Lab Results   Component Value Date    TRIG 334 (H) 12/06/2020     Lab Results   Component Value Date    HDL 33 12/06/2020     Lab Results   Component Value Date    LDLCALC 120 (H) 12/06/2020     Lab Results   Component Value  Date    LABVLDL 67 12/06/2020     No results found for: CHOLHDLRATIO   CREATININE   Date Value Ref Range Status   04/30/2021 1.1 0.7 - 1.2 mg/dL Final   12/12/2020 1.1 0.7 - 1.2 mg/dL Final   12/11/2020 1.1 0.7 - 1.2 mg/dL Final       The 10-year ASCVD risk score Mikey Bussing  DC Jr., et al., 2013) is: 13.5%    Values used to calculate the score:      Age: 26 years      Sex: Male      Is Non-Hispanic African American: Yes      Diabetic: Yes      Tobacco smoker: No      Systolic Blood Pressure: 160 mmHg      Is BP treated: Yes      HDL Cholesterol: 33 mg/dL      Total Cholesterol: 220 mg/dL     Physical Exam  Constitutional:       General: He is not in acute distress.     Appearance: Normal appearance.   HENT:      Head: Normocephalic and atraumatic.      Right Ear: External ear normal.      Left Ear: External ear normal.      Nose: Nose normal.      Mouth/Throat:      Mouth: Mucous membranes are moist.   Eyes:      Extraocular Movements: Extraocular movements intact.      Conjunctiva/sclera: Conjunctivae normal.   Cardiovascular:      Rate and Rhythm: Normal rate and regular rhythm.      Heart sounds: No murmur heard.      Pulmonary:      Effort: Pulmonary effort is normal.      Breath sounds: Normal breath sounds. No wheezing.   Musculoskeletal:         General: Normal range of motion.   Neurological:      General: No focal deficit present.      Mental Status: He is alert.   Psychiatric:         Mood and Affect: Mood normal.         Behavior: Behavior normal.             An electronic signature was used to authenticate this note.    --Gillian Shields, MD       *NOTE: This report was transcribed using voice recognition software. Every effort was made to ensure accuracy; however, inadvertent computerized transcription errors may be present.

## 2021-05-05 NOTE — Telephone Encounter (Signed)
Patient's wife Misty Stanley was notified and given information.

## 2021-05-23 ENCOUNTER — Telehealth

## 2021-05-23 NOTE — Telephone Encounter (Signed)
-----   Message from Wylie Hail sent at 05/23/2021  3:06 PM EDT -----  Subject: Referral Request    QUESTIONS   Reason for referral request? Physical Therapy referral request would like   referral sent to [facility]   Has the physician seen you for this condition before? Yes  Select a date? 2021-05-04  Select the Provider the patient wants to be referred to, if known (PCP or   Specialist)? Langley Adie   Preferred Specialist (if applicable)?   Do you already have an appointment scheduled? No  Additional Information for Provider?   ---------------------------------------------------------------------------  --------------  CALL BACK INFO  What is the best way for the office to contact you? OK to leave message on   voicemail  Preferred Call Back Phone Number? [phone]  ---------------------------------------------------------------------------  --------------  SCRIPT ANSWERS  Relationship to Patient? Other  Representative Name? Deirdre Pippins   Is the [employer] on the appropriate HIPAA document in Epic? Yes

## 2021-05-24 NOTE — Telephone Encounter (Signed)
Pt called spoke with pt wife she stated pt wanted PT for his right knee

## 2021-05-24 NOTE — Telephone Encounter (Signed)
Can you find out if he wants to do physical therapy just for general fatigue years of for specific joint

## 2021-05-24 NOTE — Telephone Encounter (Signed)
Okay I put in the referral

## 2021-05-27 ENCOUNTER — Inpatient Hospital Stay: Payer: PRIVATE HEALTH INSURANCE | Primary: Family Medicine

## 2021-05-27 DIAGNOSIS — R809 Proteinuria, unspecified: Secondary | ICD-10-CM

## 2021-05-27 LAB — URINALYSIS
Bilirubin Urine: NEGATIVE
Glucose, Ur: 100 mg/dL — AB
Ketones, Urine: NEGATIVE mg/dL
Leukocyte Esterase, Urine: NEGATIVE
Nitrite, Urine: NEGATIVE
Protein, UA: 30 mg/dL — AB
Specific Gravity, UA: >=1.03 (ref 1.005–1.030)
Urobilinogen, Urine: 1 E.U./dL (ref <2.0)
pH, UA: 5.5 (ref 5.0–9.0)

## 2021-05-27 LAB — TESTOSTERONE: Testosterone: 403.8 ng/dL

## 2021-05-27 LAB — MICROSCOPIC URINALYSIS: Bacteria, UA: NONE SEEN /HPF

## 2021-05-27 LAB — D-DIMER, QUANTITATIVE: D-Dimer, Quant: <200 ng/mL DDU

## 2021-05-29 LAB — CULTURE, URINE

## 2021-07-04 ENCOUNTER — Encounter: Payer: Self-pay | Admitting: *Deleted

## 2021-07-10 ENCOUNTER — Encounter
Admit: 2021-07-10 | Discharge: 2021-07-10 | Payer: PRIVATE HEALTH INSURANCE | Attending: Family Medicine | Primary: Family Medicine

## 2021-07-10 DIAGNOSIS — E119 Type 2 diabetes mellitus without complications: Secondary | ICD-10-CM

## 2021-07-10 LAB — POCT URINALYSIS DIPSTICK
Bilirubin, UA: NEGATIVE
Glucose, UA POC: 500
Ketones, UA: NEGATIVE
Leukocytes, UA: NEGATIVE
Nitrite, UA: NEGATIVE
Protein, UA POC: 100
Urobilinogen, UA: 0.2
pH, UA: 5.5

## 2021-07-10 LAB — POCT GLYCOSYLATED HEMOGLOBIN (HGB A1C): Hemoglobin A1C: 8.1 %

## 2021-07-10 NOTE — Progress Notes (Signed)
Thomas Walton (DOB:  1971/01/21) is a 50 y.o. male, established patient follow up , here for evaluation of the following:  Diabetes (3 mo follow up) and Hypertension         ASSESSMENT/PLAN      1. Type 2 diabetes mellitus without complication, without long-term current use of insulin (HCC)  Chronic, not well controlled, A1c at 8.1%, down from 8.6%, doing better now on the extended release metformin, no side effects, would like to try to reduce his juice intake versus increase the metformin at this time, discussed getting an eye exam, foot exam within normal limits, follow-up in 3 months  -     POCT glycosylated hemoglobin (Hb A1C)  2. Hematuria, unspecified type  Chronic, unclear cause, no smoking history, asymptomatic, contribution of Xarelto likely, will refer to urology now  -     POCT Urinalysis no Micro  -     AFL - Dalia Heading, MD, Urology, [place]  3. Muscle tension pain  Acute, right trapezius with significant tension, recommended Voltaren gel, provided him with neck and shoulder stretching yoga, likely his right anterior pain with sneezing is secondary to the muscle tension of his neck and shoulders, follow-up at next visit  4. Chronic anticoagulation  Chronic, with hematuria, no blood in the stool, regular rate and rhythm today, monitor at each visit  5. Paroxysmal atrial fibrillation (HCC)  6. OSA (obstructive sleep apnea)  Chronic, will get CPAP on Friday, did discuss given he had to go few weeks to get used to it, should help with daytime sleepiness  7. Chronic pain of left knee  Chronic, mild to moderate degeneration, BB in his knee, some osteophytes, going to [facility], would like Ortho referral now, he will get back to me with the orthopedic recommendation per his wife    Return in about 3 months (around 10/10/2021) for Diabetes follow up.         Subjective   SUBJECTIVE/OBJECTIVE:  HPI  He is for follow up. He is going to meet with sleep med and getting cpap on friday.   He is  going to PT for his knee. He is not sure it is getting better. Would like referral to ortho. His wife will let  us know which one.   No visible hematuria to him. On xerelto blood on urinalysis today   A1c still elevated, he notes he does drink a lot of apple juice and grape juice, he feels he could discontinue this, avoids sugary sweets, high carbohydrate snacks, would like to hold off on starting any new medicines      Declined preventative screening identified as care gaps unless ordered through this visit    PHQ2/PHQ9  PHQ-2 Score: 0  PHQ-2 Over the past 2 weeks, how often have you been bothered by any of the following problems?  Little interest or pleasure in doing things: Not at all  Feeling down, depressed, or hopeless: Not at all  PHQ-2 Score: 0   PHQ-9 Total Score: 0 (07/10/2021  7:50 AM)       Past Medical History:  has a past medical history of Asthma, Diabetes ([place]), HLD (hyperlipidemia), HTN (hypertension), and PAF (paroxysmal atrial fibrillation) ([place]).   Past Surgical History:  has a past surgical history that includes Cardiac catheterization (12/12/2020).  Social History:  reports that he has never smoked. He has never used smokeless tobacco. He reports current alcohol use. He reports that he does not use drugs.  Family  History: family history includes Heart Attack in his father.  Allergies: Shellfish-derived products  Medications:   Current Outpatient Medications   Medication Sig Dispense Refill   ??? metFORMIN (GLUCOPHAGE-XR) 500 MG extended release tablet Take 1 tablet by mouth daily (with breakfast) 30 tablet 5   ??? atorvastatin (LIPITOR) 40 MG tablet Take 1 tablet by mouth nightly 90 tablet 2   ??? metoprolol succinate (TOPROL XL) 50 MG extended release tablet Take 1 tablet by mouth in the morning and at bedtime 180 tablet 2   ??? losartan (COZAAR) 50 MG tablet Take 1 tablet by mouth daily 90 tablet 2   ??? rivaroxaban (XARELTO) 20 MG TABS tablet Take 1 tablet by mouth daily (with breakfast) 90 tablet 1    ??? FREESTYLE LITE strip Inject 1 each into the skin 4 times daily (before meals and nightly)     ??? FreeStyle Lancets MISC Inject 1 each into the skin 4 times daily (before meals and nightly)     ??? glucose monitoring (FREESTYLE FREEDOM) kit 1 kit by Does not apply route daily 1 kit 0     No current facility-administered medications for this visit.      Allergies: Shellfish-derived products     Review of Systems   Constitutional: Negative for activity change, appetite change, fatigue, fever and unexpected weight change.   HENT: Negative for trouble swallowing.    Eyes: Negative for visual disturbance.   Respiratory: Negative for shortness of breath.    Cardiovascular: Negative for chest pain.   Gastrointestinal: Negative for abdominal pain.   Musculoskeletal: Positive for myalgias. Negative for arthralgias.   Neurological: Negative for weakness, light-headedness and headaches.   Psychiatric/Behavioral: Negative for confusion and sleep disturbance.   All other systems reviewed and are negative.         Objective   BP 139/76 (Site: Right Upper Arm, Position: Sitting, Cuff Size: Large Adult)    Pulse 78    Temp 97.3 ??F (36.3 ??C) (Temporal)    Resp 20    Ht 6' 3"  (1.905 m)    Wt 289 lb 3.2 oz (131.2 kg)    SpO2 97%    BMI 36.15 kg/m??       Lab Results   Component Value Date    LABA1C 8.1 07/10/2021    LABA1C 8.6 04/11/2021    LABA1C 7.3 (H) 12/06/2020     Lab Results   Component Value Date    CHOL 220 (H) 12/06/2020     Lab Results   Component Value Date    TRIG 334 (H) 12/06/2020     Lab Results   Component Value Date    HDL 33 12/06/2020     Lab Results   Component Value Date    LDLCALC 120 (H) 12/06/2020     Lab Results   Component Value Date    LABVLDL 67 12/06/2020     No results found for: CHOLHDLRATIO   CREATININE   Date Value Ref Range Status   04/30/2021 1.1 0.7 - 1.2 mg/dL Final   12/12/2020 1.1 0.7 - 1.2 mg/dL Final   12/11/2020 1.1 0.7 - 1.2 mg/dL Final       The 10-year ASCVD risk score Mikey Bussing DC Jr., et al.,  2013) is: 19.5%    Values used to calculate the score:      Age: 88 years      Sex: Male      Is Non-Hispanic African American: Yes  Diabetic: Yes      Tobacco smoker: No      Systolic Blood Pressure: 664 mmHg      Is BP treated: Yes      HDL Cholesterol: 33 mg/dL      Total Cholesterol: 220 mg/dL     Physical Exam  Constitutional:       General: He is not in acute distress.     Appearance: Normal appearance.   HENT:      Head: Normocephalic and atraumatic.      Right Ear: External ear normal.      Left Ear: External ear normal.      Nose: Nose normal.      Mouth/Throat:      Mouth: Mucous membranes are moist.   Eyes:      Extraocular Movements: Extraocular movements intact.      Conjunctiva/sclera: Conjunctivae normal.   Cardiovascular:      Rate and Rhythm: Normal rate and regular rhythm.      Heart sounds: No murmur heard.      Pulmonary:      Effort: Pulmonary effort is normal.      Breath sounds: Normal breath sounds. No wheezing.   Musculoskeletal:         General: Normal range of motion.        Arms:    Neurological:      General: No focal deficit present.      Mental Status: He is alert.   Psychiatric:         Mood and Affect: Mood normal.         Behavior: Behavior normal.     Diabetic foot exam:   Left Foot:   Visual Exam: normal   Pulse DP: 2+ (normal)   Filament test: normal sensation     Right Foot:   Visual Exam: normal   Pulse DP: 2+ (normal)   Filament test: normal sensation          An electronic signature was used to authenticate this note.    --Gillian Shields, MD       *NOTE: This report was transcribed using voice recognition software. Every effort was made to ensure accuracy; however, inadvertent computerized transcription errors may be present.

## 2021-07-12 ENCOUNTER — Telehealth

## 2021-07-12 NOTE — Telephone Encounter (Signed)
Pt. Needs a referral for orthopaedics in [place] [address]. Mayford Knife for his left knee.

## 2021-07-12 NOTE — Telephone Encounter (Signed)
Done

## 2021-07-12 NOTE — Telephone Encounter (Signed)
Radiation Dose Estimates    No radiation information found for this patient  Narrative   EXAMINATION:   FOUR XRAY VIEWS OF THE LEFT KNEE   ??   04/11/2021 8:10 am   ??   COMPARISON:   None.   ??   HISTORY:   ORDERING SYSTEM PROVIDED HISTORY: Chronic pain of left knee   TECHNOLOGIST PROVIDED HISTORY:   Reason for exam:->left knee ain   ??   FINDINGS:   Frontal view of both knees shows mild narrowing of medial compartment of   right and left knee. ??Minimal osteophytosis along inner margin of medial   compartment of right knee. ??Mild to moderate osteophytosis along inner margin   of medial compartment of the left knee. ??Radiopaque BB is seen at level of   popliteal fossa of the left knee. ??No fracture or dislocation. ??Patella is in   normal position. ??No synovial effusion.   ??   ??   Impression   1. No fracture or dislocation.   2. 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   ??     1. Chronic pain of left knee

## 2021-07-13 ENCOUNTER — Ambulatory Visit (INDEPENDENT_AMBULATORY_CARE_PROVIDER_SITE_OTHER): Payer: Self-pay | Admitting: Family Medicine

## 2021-07-13 ENCOUNTER — Encounter: Payer: Self-pay | Admitting: Family Medicine

## 2021-07-13 ENCOUNTER — Other Ambulatory Visit: Payer: Self-pay

## 2021-07-13 VITALS — BP 130/76 | HR 82 | Temp 98.1°F | Resp 14 | Ht 74.0 in | Wt 223.0 lb

## 2021-07-13 DIAGNOSIS — Z0000 Encounter for general adult medical examination without abnormal findings: Secondary | ICD-10-CM

## 2021-07-13 DIAGNOSIS — F418 Other specified anxiety disorders: Secondary | ICD-10-CM

## 2021-07-13 MED ORDER — ALPRAZOLAM 0.5 MG PO TABS: 0.5000 mg | ORAL_TABLET | Freq: Three times a day (TID) | ORAL | 2 refills | Status: DC | PRN

## 2021-07-13 NOTE — Progress Notes (Signed)
Subjective:    Patient ID: Aaron Williams, male    DOB: April 25, 1971, 50 y.o.   MRN: [id number]  Patient is using Xanax for situational anxiety.  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  He takes half of a half a milligram (0.25 mg).  He also takes 0.25 mg on Wednesday night.  He essentially uses the medication prior to public speaking to try to calm his nerves to prevent stuttering.  Outside of that, he feels great.  The remainder of the time he does not require any medication to help with anxiety.  Unfortunately, he has been under tremendous stress for a variety of reasons over the last few years.  In addition to his anxiety related to work, he is currently separated.  However he seems to be handling the stress remarkably well.  He denies any depression.  Overall he is doing well.  He essentially came in today just to get a refill on his medication.  He is overdue for fasting lab work.  He has never had a colonoscopy although he denies any family history of colon cancer.  History reviewed. No pertinent past medical history.  No current outpatient medications on file prior to visit.   No current facility-administered medications on file prior to visit.   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 Denies any alcohol tobacco or illicit drugs. Family History  Problem Relation Age of Onset   Diabetes Mother    Heart disease Father    Parkinson's disease Maternal Grandmother    Heart disease Paternal Grandfather       Review of Systems  All other systems reviewed and are negative.     Objective:   Physical Exam Vitals reviewed.  Constitutional:      General: He is not in acute distress.    Appearance: He is well-developed. He is not diaphoretic.  Cardiovascular:     Rate and Rhythm: Normal rate and regular rhythm.     Pulses: Normal pulses.     Heart sounds: Normal heart sounds. No murmur heard.   No friction rub. No gallop.   Pulmonary:     Effort: No respiratory distress.     Breath sounds: Normal breath sounds. No wheezing or rales.  Abdominal:     General: Bowel sounds are normal. There is no distension.     Palpations: Abdomen is soft.     Tenderness: There is no abdominal tenderness. There is no guarding or rebound.  Musculoskeletal:     Right lower leg: No edema.     Left lower leg: No edema.  Neurological:     Mental Status: He is alert and oriented to person, place, and time.     Cranial Nerves: No cranial nerve deficit.     Sensory: No sensory deficit.     Coordination: Coordination normal.  Psychiatric:        Behavior: Behavior normal.        Thought Content: Thought content normal.        Judgment: Judgment normal.          Assessment & Plan:  General medical exam  Situational anxiety  Continue Xanax 0.25 mg twice a week as needed prior to public speaking.  Patient seems to be coping with the stress well.  He denies any depression or suicidal thoughts.  He is overdue for fasting lab work and I would like him to return for a CBC CMP  and a fasting lipid panel.  Also recommended a colonoscopy due to age-related recommendations.  We also discussed Cologuard.  He would like to weigh these options and then let me know his decision.

## 2021-07-16 ENCOUNTER — Ambulatory Visit: Payer: PRIVATE HEALTH INSURANCE | Primary: Family Medicine

## 2021-07-16 DIAGNOSIS — G4733 Obstructive sleep apnea (adult) (pediatric): Secondary | ICD-10-CM

## 2021-07-17 ENCOUNTER — Inpatient Hospital Stay: Payer: PRIVATE HEALTH INSURANCE | Primary: Family Medicine

## 2021-07-19 NOTE — Telephone Encounter (Signed)
Received message on DAR that pt wants to cancel.  Called pt to reschedule appt.  LMOM asking pt to call to reschedule.

## 2021-07-20 ENCOUNTER — Encounter: Payer: PRIVATE HEALTH INSURANCE | Attending: Sleep Medicine | Primary: Family Medicine

## 2021-07-21 ENCOUNTER — Ambulatory Visit
Admit: 2021-07-21 | Discharge: 2021-07-21 | Payer: PRIVATE HEALTH INSURANCE | Attending: Orthopaedic Surgery | Primary: Family Medicine

## 2021-07-21 DIAGNOSIS — M1712 Unilateral primary osteoarthritis, left knee: Secondary | ICD-10-CM

## 2021-07-21 NOTE — Progress Notes (Addendum)
Thomas Walton is a 50 y.o. male, who presents   Chief Complaint   Patient presents with    Knee Pain     Left knee pain fell a year ago and theres pain on the medial side of the knee and posterior.       HPI:: Knee pains been present for quite some time with worsening over time.  There is also development of a lump on the medial side which Thomas Walton is concerned about.  He has difficulty with endurance of activities and he does have a job that requires a lot of walking.  Some feelings of instability in the knee with no falls.  There is been swelling and crepitus as well.    Allergies; medications; past medical, surgical, family, and social history; and problem list have been reviewed today and updated as indicated in this encounter - see below following Ortho specifics.    Musculoskeletal: Skin condition gross for neurovascular functions good in both lower extremities.  Single-leg stance possible on each side without assistance.  Gait is fairly smooth and balanced.  Hip range of motion stability are good without discomfort.  The right knee is stable with good range of motion and fine crepitus throughout the range of motion with no signs specifically meniscal pathology.    The left knee range of motion is near 0 to 110 degrees or more flexion.  There is tenderness to palpation along the medial joint line and some medial gapping and prominence of the medial femoral-and tibial condyles in the form of osteophyte.  McMurray testing does not specifically suggest meniscal pathology.    Radiologic Studies: Imaging studies 04/11/2021 with weightbearing AP's of both knees shows hypertrophic marginal changes much greater on the left knee tibia and femur than right.  There also on the lateral and patellofemoral views greater patellofemoral wear in the left knee.  The right was not imaged in these viewpoints.    ASSESSMENT:  [name] was seen today for knee pain.    Diagnoses and all orders for this visit:    Primary  osteoarthritis of left knee     Treatment alternatives were reviewed including medical and physical therapies, injections, and surgical options, expected risks benefits and likely outcome of each were discussed in detail, questions asked and answered and understood.  We discussed the symptoms well physical findings and imaging results.  Thomas Walton wife was present throughout the visit as well.  He definitely has degenerative arthrosis in the knee.  We reviewed treatment options including oral, topical, physical therapy, external support hand injectables.  Viscosupplementation was chosen and we will seek approval for that.    PLAN: Visco supplement injection left knee if approved.        Patient Active Problem List   Diagnosis    Paroxysmal atrial fibrillation (HCC)    Chronic anticoagulation    Type 2 diabetes mellitus without complication, without long-term current use of insulin (HCC)    OSA (obstructive sleep apnea)    Cardiovascular risk factor    Snoring    Primary hypertension    Microalbuminuria    Asymptomatic microscopic hematuria    Muscle tension pain    Chronic pain of left knee       Past Medical History:   Diagnosis Date    Asthma     Diabetes ([place])     HLD (hyperlipidemia)     HTN (hypertension)     PAF (paroxysmal atrial fibrillation) ([place])  Past Surgical History:   Procedure Laterality Date    CARDIAC CATHETERIZATION  12/12/2020       Current Outpatient Medications   Medication Sig Dispense Refill    metFORMIN (GLUCOPHAGE-XR) 500 MG extended release tablet Take 1 tablet by mouth daily (with breakfast) 30 tablet 5    atorvastatin (LIPITOR) 40 MG tablet Take 1 tablet by mouth nightly 90 tablet 2    metoprolol succinate (TOPROL XL) 50 MG extended release tablet Take 1 tablet by mouth in the morning and at bedtime 180 tablet 2    losartan (COZAAR) 50 MG tablet Take 1 tablet by mouth daily 90 tablet 2    rivaroxaban (XARELTO) 20 MG TABS tablet Take 1 tablet by mouth daily (with breakfast) 90  tablet 1    FREESTYLE LITE strip Inject 1 each into the skin 4 times daily (before meals and nightly)      FreeStyle Lancets MISC Inject 1 each into the skin 4 times daily (before meals and nightly)      glucose monitoring (FREESTYLE FREEDOM) kit 1 kit by Does not apply route daily 1 kit 0     No current facility-administered medications for this visit.       Allergies   Allergen Reactions    Shellfish-Derived Products Anaphylaxis     Crab, lobster, shrimp, etc.       Social History     Socioeconomic History    Marital status: Social worker     Spouse name: None    Number of children: None    Years of education: None    Highest education level: None   Tobacco Use    Smoking status: Never    Smokeless tobacco: Never   Vaping Use    Vaping Use: Never used   Substance and Sexual Activity    Alcohol use: Yes     Comment: rare    Drug use: Never   Social History Narrative    Drinks 1 cup of coffee/week     Social Determinants of Company secretary Strain: Low Risk     Difficulty of Paying Living Expenses: Not hard at all   Food Insecurity: No Food Insecurity    Worried About Charity fundraiser in the Last Year: Never true    Arboriculturist in the Last Year: Never true   Transportation Needs: No Transportation Needs    Lack of Transportation (Medical): No    Lack of Transportation (Non-Medical): No   Physical Activity: Insufficiently Active    Days of Exercise per Week: 3 days    Minutes of Exercise per Session: 10 min   Stress: Stress Concern Present    Feeling of Stress : Very much   Social Connections: Moderately Integrated    Frequency of Communication with Friends and Family: More than three times a week    Frequency of Social Gatherings with Friends and Family: More than three times a week    Attends Religious Services: Never    Marine scientist or Organizations: Yes    Attends Music therapist: More than 4 times per year    Marital Status: Married   Human resources officer Violence: Not At  Risk    Fear of Current or Ex-Partner: No    Emotionally Abused: No    Physically Abused: No    Sexually Abused: No   Housing Stability: Low Risk     Unable to Pay for Housing in the  Last Year: No    Number of Places Lived in the Last Year: 1    Unstable Housing in the Last Year: No       Family History   Problem Relation Age of Onset    Heart Attack Father          Review of Systems:   As follows except as previously noted in HPI:  Constitutional: Negative for chills, diaphoresis,  fever   Respiratory: Negative for cough, shortness of breath and wheezing.    Cardiovascular: Negative for chest pain and palpitations.   Neurological: Negative for dizziness, syncope,   GI / GU: abdominal pain or cramping  Musculoskeletal: see HPI       Objective:   Physical Exam   Constitutional: Oriented to person, place, and time. and appears well-developed and well-nourished. :   Head: Normocephalic and atraumatic.   Neck: Neck supple.  Eyes: EOM are normal.   Pulmonary/Chest: Effort normal.  No respiratory distress, no wheezes.   Neurological: Alert and oriented to person  Skin: Skin is warm and dry.               Claudie Revering, DO    07/21/21  11:37 AM    All reasonable efforts have been made to minimize the risk of errors that may occur in the use of voice recognition and other electronic means of charting.

## 2021-07-24 DIAGNOSIS — G4733 Obstructive sleep apnea (adult) (pediatric): Secondary | ICD-10-CM

## 2021-07-24 NOTE — Progress Notes (Signed)
[facility]                  [address] [place], [place] [address]                               SLEEP STUDY REPORT    PATIENT NAME: Thomas Walton, Thomas Walton                    DOB:        1971/10/11  MED REC NO:   [id number]                            ROOM:  ACCOUNT NO:   [id number]                           ADMIT DATE: 07/24/2021  PROVIDER:     Rosalie Doctor, MD    DATE OF STUDY:  07/24/2021    REFERRING PROVIDER:  Alesia Banda, M.D.    STUDY PERFORMED:  Polysomnography.    INDICATION FOR POLYSOMNOGRAPHY:  Known sleep apnea - for positive airway  pressure titration.    CURRENT MEDICATIONS:  Losartan, metoprolol, Eliquis, atorvastatin, and  albuterol.    INTERPRETATION:  SLEEP ARCHITECTURE:  This patient had a total time in bed of 360  minutes.  Total sleep time was 339 minutes.  Sleep efficiency was 94%  and sleep latency was two minutes.  REM latency was 79 minutes.    SLEEP STAGING:  The patient was awake 6% of the time in bed.  Stage N1  was 6% and N2 was 52% of the total sleep time.  Slow wave sleep was 16%  of the sleep time and stage REM sleep was 76% of the sleep time.    RESPIRATION SUMMARY:  APNEA:  There were 20 apneic events which were all obstructive, occurred  during non-REM stage sleep, and had a maximum duration of 35 seconds.    HYPOPNEA:  There were 29 hypopneic events, four occurred during REM  stage sleep and maximum duration was 37 seconds.    APNEA/HYPOPNEA INDEX:  The apnea/hypopnea index is 9.    POSITIVE AIRWAY PRESSURE TITRATION:  This patient was started on a CPAP  of six, which was gradually increased to a CPAP of 14.  At that level,  the patient slept for 53 minutes in REM stage sleep and 63 minutes in  non-REM stage sleep.  The apnea/hypopnea index was two.    AROUSAL ANALYSIS:  There were 34 arousals/awakenings, the sleep  disruption index was normal.    LIMB MOVEMENT SUMMARY:  There were 83 limb movements, the limb movement  index is  normal.    OXYGEN SATURATION:  Average oxygen saturation while awake was 95%.   Lowest saturation was 75%.  The patient spent 1% of the time with  saturation less than 90%.    HEART RATE SUMMARY:  Average heart rate while awake was 65 beats per  minute.  Maximum heart rate during sleep was 78 beats per minute and  minimum heart rate during sleep was 58 beats per minute.  There were no  ectopic beats noted.    MISCELLANEOUS:  Epworth Sleepiness Scale score is 12/24.  Snoring was  moderate and eliminated with positive airway pressure.  There was no  apparent bruxism.  IMPRESSION:  1.  Known obstructive sleep apnea.  2.  Good titration with CPAP.  3.  Moderate snoring, eliminated with CPAP.  4.  Nocturnal hypoxia, eliminated with CPAP.  5.  No cardiac dysrhythmia.    DISCUSSION:  On a previous study, this patient was found to have severe  obstructive sleep apnea with significant nocturnal hypoxia.  With  titration of CPAP, which the patient tolerated well, good results were  achieved with elimination of snoring, normalization of oxygen  saturation, and normalization of the apnea/hypopnea index.  Also, one  notes hypersomnolence indicated by a high sleep efficiency and a short  sleep latency, which is consistent with the patient's elevated Epworth  Sleepiness Scale score.  Hopefully this will improve with treatment.    SUGGESTIONS:  1.  Dr. Lamarr Lulas to discuss the results of study with the patient.  2.  The patient should have CPAP at 14 cm of water pressure.  3.  The patient should use a [employer] Vitera full facemask,  size large with heated humidification.        Rosalie Doctor, MD  Diplomat of Sleep Medicine    D: 07/25/2021 12:39:44       T: 07/25/2021 12:43:08     [email or web address]  Job#: [id number]     Doc#: [id number]    CC:

## 2021-07-25 ENCOUNTER — Inpatient Hospital Stay: Admit: 2021-07-25 | Discharge: 2021-07-25 | Payer: PRIVATE HEALTH INSURANCE | Primary: Family Medicine

## 2021-08-02 ENCOUNTER — Ambulatory Visit
Admit: 2021-08-02 | Discharge: 2021-08-02 | Payer: PRIVATE HEALTH INSURANCE | Attending: Orthopaedic Surgery | Primary: Family Medicine

## 2021-08-02 ENCOUNTER — Encounter

## 2021-08-02 DIAGNOSIS — M1712 Unilateral primary osteoarthritis, left knee: Secondary | ICD-10-CM

## 2021-08-02 MED ORDER — TRIAMCINOLONE ACETONIDE 40 MG/ML IJ SUSP
40 MG/ML | Freq: Once | INTRAMUSCULAR | Status: AC
Start: 2021-08-02 — End: 2021-08-02
  Administered 2021-08-02: 19:00:00 40 mg via INTRA_ARTICULAR

## 2021-08-02 NOTE — Progress Notes (Signed)
Chief Complaint:   Chief Complaint   Patient presents with    Knee Pain     Left knee pain [name] was Denied       Thomas Walton follows up for his left knee problems.  Through investigation for coverage of viscosupplementation to his left knee it was discovered that this did not coverage criteria and a corticosteroid injection would be necessary first.  Overall symptoms have not changed.      Allergies; medications; past medical, surgical, family, and social history; and problem list have been reviewed today and updated as indicated in this encounter seen below.    Exam: The left knee range of motion is near 0 to 110 degrees or more flexion.  There is tenderness to palpation along the medial joint line and some medial gapping and prominence of the medial femoral-and tibial condyles in the form of osteophyte.  McMurray testing does not specifically suggest meniscal pathology.    Radiographs: None    ASSESSMENT:    Thomas Walton was seen today for knee pain.    Diagnoses and all orders for this visit:    Primary osteoarthritis of left knee  -     PR ARTHROCENTESIS ASPIR&/INJ MAJOR JT/BURSA W/O [place]    Other orders  -     triamcinolone acetonide (KENALOG-40) injection 40 mg      PLAN: Injectionof the left knee with  4 cc    1/4  % bupivicaine  and Kenalog   (triamcinalone)         91mwas discussed with the patient.   Discussion included but was not limited to potential risks and benefits.   No contraindications to the procedure were noted.  Understanding and agreement was indicated.  The procedure was accomplished without incident using appropriate aseptic technique.  follow up as needed      Return if symptoms worsen or fail to improve.       Current Outpatient Medications   Medication Sig Dispense Refill    metFORMIN (GLUCOPHAGE-XR) 500 MG extended release tablet Take 1 tablet by mouth daily (with breakfast) 30 tablet 5    atorvastatin (LIPITOR) 40 MG tablet Take 1 tablet by mouth nightly 90 tablet 2    metoprolol  succinate (TOPROL XL) 50 MG extended release tablet Take 1 tablet by mouth in the morning and at bedtime 180 tablet 2    losartan (COZAAR) 50 MG tablet Take 1 tablet by mouth daily 90 tablet 2    rivaroxaban (XARELTO) 20 MG TABS tablet Take 1 tablet by mouth daily (with breakfast) 90 tablet 1    FREESTYLE LITE strip Inject 1 each into the skin 4 times daily (before meals and nightly)      FreeStyle Lancets MISC Inject 1 each into the skin 4 times daily (before meals and nightly)      glucose monitoring (FREESTYLE FREEDOM) kit 1 kit by Does not apply route daily 1 kit 0     No current facility-administered medications for this visit.       Patient Active Problem List   Diagnosis    Paroxysmal atrial fibrillation (HCC)    Chronic anticoagulation    Type 2 diabetes mellitus without complication, without long-term current use of insulin (HCC)    OSA (obstructive sleep apnea)    Cardiovascular risk factor    Snoring    Primary hypertension    Microalbuminuria    Asymptomatic microscopic hematuria    Muscle tension pain    Chronic pain of  left knee       Past Medical History:   Diagnosis Date    Asthma     Diabetes ([place])     HLD (hyperlipidemia)     HTN (hypertension)     PAF (paroxysmal atrial fibrillation) (HCC)        Past Surgical History:   Procedure Laterality Date    CARDIAC CATHETERIZATION  12/12/2020       Allergies   Allergen Reactions    Shellfish-Derived Products Anaphylaxis     Crab, lobster, shrimp, etc.       Social History     Socioeconomic History    Marital status: Social worker     Spouse name: None    Number of children: None    Years of education: None    Highest education level: None   Tobacco Use    Smoking status: Never    Smokeless tobacco: Never   Vaping Use    Vaping Use: Never used   Substance and Sexual Activity    Alcohol use: Yes     Comment: rare    Drug use: Never   Social History Narrative    Drinks 1 cup of coffee/week     Social Determinants of Company secretary Strain: Low  Risk     Difficulty of Paying Living Expenses: Not hard at all   Food Insecurity: No Food Insecurity    Worried About Charity fundraiser in the Last Year: Never true    Arboriculturist in the Last Year: Never true   Transportation Needs: No Transportation Needs    Lack of Transportation (Medical): No    Lack of Transportation (Non-Medical): No   Physical Activity: Insufficiently Active    Days of Exercise per Week: 3 days    Minutes of Exercise per Session: 10 min   Stress: Stress Concern Present    Feeling of Stress : Very much   Social Connections: Moderately Integrated    Frequency of Communication with Friends and Family: More than three times a week    Frequency of Social Gatherings with Friends and Family: More than three times a week    Attends Religious Services: Never    Marine scientist or Organizations: Yes    Attends Music therapist: More than 4 times per year    Marital Status: Married   Human resources officer Violence: Not At Risk    Fear of Current or Ex-Partner: No    Emotionally Abused: No    Physically Abused: No    Sexually Abused: No   Housing Stability: Low Risk     Unable to Pay for Housing in the Last Year: No    Number of [place] in the Last Year: 1    Unstable Housing in the Last Year: No       Review of Systems  As follows except as previously noted in HPI:  Constitutional: Negative for chills, diaphoresis, fatigue, fever and unexpected weight change.   Respiratory: Negative for cough, shortness of breath and wheezing.    Cardiovascular: Negative for chest pain and palpitations.   Neurological: Negative for dizziness, syncope, cephalgia.  GI / GU: negative  Musculoskeletal: see HPI       Objective:   Physical Exam   Constitutional: Oriented to person, place, and time. and appears well-developed and well-nourished. :   Head: Normocephalic and atraumatic.   Eyes: EOM are normal.   Neck: Neck supple.  Cardiovascular: Normal rate and regular rhythm.    Pulmonary/Chest:  Effort normal. No stridor. No respiratory distress, no wheezes.   Abdominal:  No abnormal distension.    Neurological: Alert and oriented to person, place, and time.   Skin: Skin is warm and dry.   Psychiatric: Normal mood and affect. Behavior is normal. Thought content normal.    Bryona Foxworthy Ardine Eng, DO    08/02/21  3:16 PM

## 2021-08-02 NOTE — Telephone Encounter (Signed)
Called patient and left message for patient to return call to office to discuss results of sleep study. Awaiting return phone call.

## 2021-08-02 NOTE — Telephone Encounter (Signed)
Returned call and spoke with patient. Order faxed to [facility] as discussed. Explained to patient that our office will contact him to schedule a consult appointment with Dr. Lamarr Lulas. Patient verbalizes understanding and has no further questions.

## 2021-08-02 NOTE — Telephone Encounter (Signed)
Thomas Walton (fiance') is returning your call regarding [name]'s SS results.     Please call Thomas Walton.

## 2021-09-01 ENCOUNTER — Encounter

## 2021-09-01 MED ORDER — XARELTO 20 MG PO TABS
20 MG | ORAL_TABLET | ORAL | 3 refills | Status: AC
Start: 2021-09-01 — End: ?

## 2021-09-29 ENCOUNTER — Ambulatory Visit (INDEPENDENT_AMBULATORY_CARE_PROVIDER_SITE_OTHER): Payer: Self-pay | Admitting: Family Medicine

## 2021-09-29 ENCOUNTER — Other Ambulatory Visit: Payer: Self-pay

## 2021-09-29 VITALS — BP 138/98 | HR 88 | Temp 98.2°F | Ht 74.0 in | Wt 223.0 lb

## 2021-09-29 DIAGNOSIS — J029 Acute pharyngitis, unspecified: Secondary | ICD-10-CM

## 2021-09-29 DIAGNOSIS — R52 Pain, unspecified: Secondary | ICD-10-CM

## 2021-09-29 DIAGNOSIS — R509 Fever, unspecified: Secondary | ICD-10-CM

## 2021-09-29 LAB — INFLUENZA A AND B AG, IMMUNOASSAY
INFLUENZA A ANTIGEN: NOT DETECTED
INFLUENZA B ANTIGEN: NOT DETECTED

## 2021-09-29 NOTE — Progress Notes (Signed)
Subjective:    Patient ID: Aaron Williams, male    DOB: [birth year], 50 y.o.   MRN: [id number]  HPI  Symptoms began Wednesday.  Symptoms began with a severe sore throat.  By Thursday the sore throat had improved however he now has fever, body aches, subjective chills, and a nonproductive cough.  He denies any chest pain or shortness of breath.  His pulmonary exam is completely clear to auscultation bilaterally.  There is no wheezes crackles or rails.  He does have some nausea and a little bit of diarrhea but no vomiting.  He denies any dysuria or hematuria.  He denies any pleurisy or hemoptysis No past medical history on file. No past surgical history on file. Current Outpatient Medications on File Prior to Visit  Medication Sig Dispense Refill   ALPRAZolam (XANAX) 0.5 MG tablet Take 1 tablet (0.5 mg total) by mouth 3 (three) times daily as needed for anxiety. 30 tablet 2   No current facility-administered medications on file prior to visit.   No Known Allergies Social History   Socioeconomic History   Marital status: Single    Spouse name: Not on file   Number of children: Not on file   Years of education: Not on file   Highest education level: Not on file  Occupational History   Not on file  Tobacco Use   Smoking status: Never   Smokeless tobacco: Never  Vaping Use   Vaping Use: Never used  Substance and Sexual Activity   Alcohol use: No    Alcohol/week: 0.0 standard drinks   Drug use: No   Sexual activity: Not on file  Other Topics Concern   Not on file  Social History Narrative   Not on file   Social Determinants of Health   Financial Resource Strain: Not on file  Food Insecurity: Not on file  Transportation Needs: Not on file  Physical Activity: Not on file  Stress: Not on file  Social Connections: Not on file  Intimate Partner Violence: Not on file     Review of Systems  All other systems reviewed and are negative.     Objective:   Physical  Exam Constitutional:      General: He is not in acute distress.    Appearance: Normal appearance. He is normal weight. He is not ill-appearing or toxic-appearing.  HENT:     Head: Normocephalic and atraumatic.     Right Ear: Tympanic membrane and ear canal normal.     Left Ear: Tympanic membrane and ear canal normal.     Nose: Congestion and rhinorrhea present.     Mouth/Throat:     Pharynx: Oropharynx is clear. No oropharyngeal exudate or posterior oropharyngeal erythema.  Cardiovascular:     Rate and Rhythm: Normal rate and regular rhythm.     Heart sounds: Normal heart sounds. No murmur heard.   No friction rub. No gallop.  Pulmonary:     Effort: Pulmonary effort is normal. No respiratory distress.     Breath sounds: Normal breath sounds. No stridor. No wheezing, rhonchi or rales.  Abdominal:     General: Abdomen is flat. Bowel sounds are normal.  Neurological:     Mental Status: He is alert.          Assessment & Plan:  Fever, unspecified fever cause - Plan: Influenza A and B Ag, Immunoassay, CANCELED: STREP GROUP A AG, W/REFLEX TO CULT  Body aches - Plan: Influenza A and B Ag, Immunoassay,  CANCELED: STREP GROUP A AG, W/REFLEX TO CULT  Sore throat - Plan: Influenza A and B Ag, Immunoassay, CANCELED: STREP GROUP A AG, W/REFLEX TO CULT I suspect a viral upper respiratory infection.  I will screen the patient for COVID as well as influenza. COVID test is negative.  Flu test is negative.  Suspect viral upper respiratory infection.  Recommended ibuprofen 800 mg every 8 hours for body aches, Sudafed for head congestion, Afrin for nasal congestion if necessary, over-the-counter cough syrup for cough and tincture of time.

## 2021-10-06 IMAGING — CR DG CHEST 2V
2 series · 2 of 2 positions shown · non-contrast
Comparison: None.

CLINICAL DATA: Shortness of breath and COVID positive

EXAM:
CHEST - 2 VIEW

[chest pa]
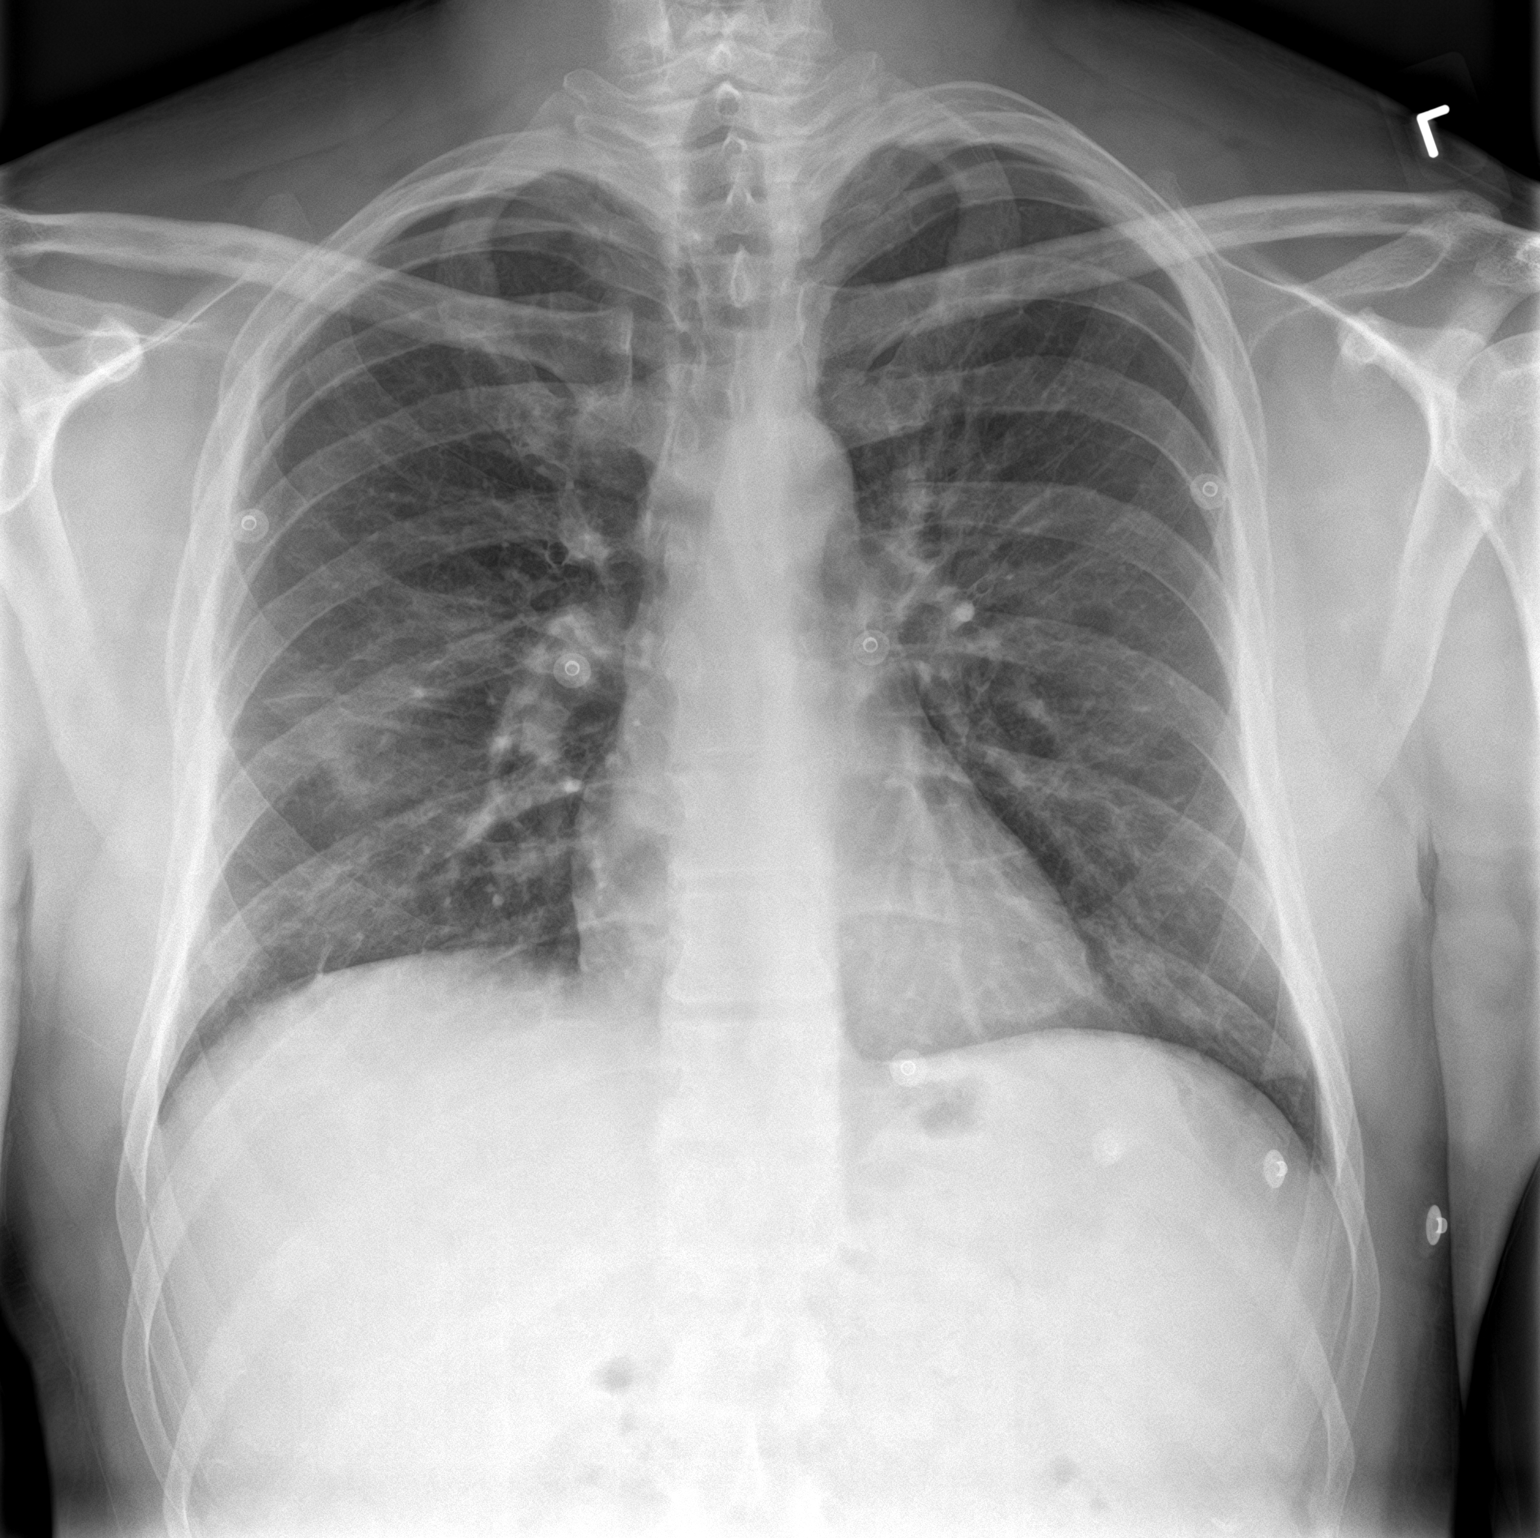

[chest lat]
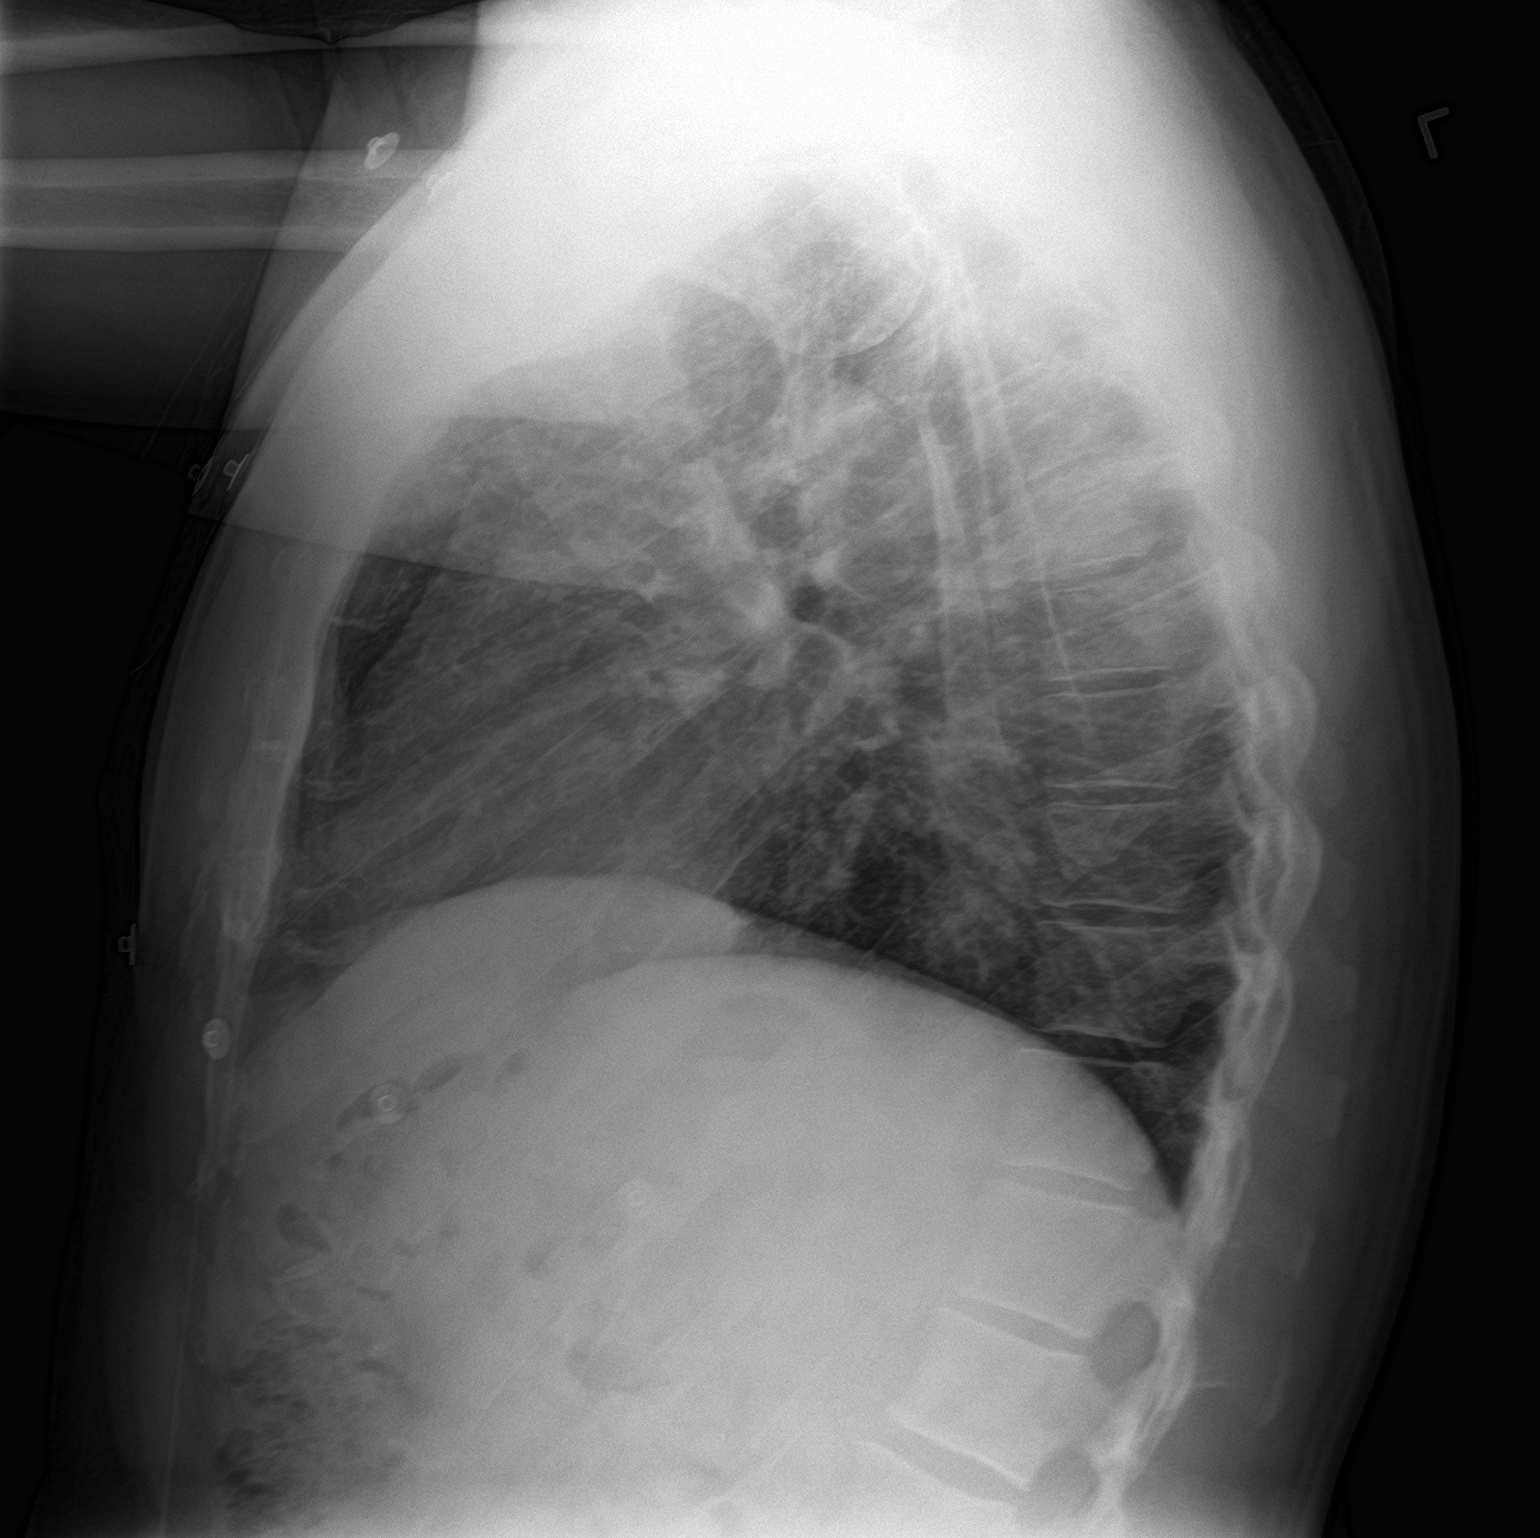

[2 of 2 positions shown; findings below may reference images not displayed]

FINDINGS: The heart size and mediastinal contours are within normal limits.
Hazy patchy airspace opacity seen within the right mid and lower
lung and the left lung base. No pleural effusion or pneumothorax is
seen. No acute osseous abnormality.
IMPRESSION: Multifocal airspace opacities, consistent with multifocal pneumonia.

## 2021-10-11 ENCOUNTER — Encounter

## 2021-10-12 MED ORDER — METFORMIN HCL ER 500 MG PO TB24
500 MG | ORAL_TABLET | ORAL | 5 refills | Status: DC
Start: 2021-10-12 — End: 2022-06-13

## 2021-10-26 ENCOUNTER — Encounter: Payer: PRIVATE HEALTH INSURANCE | Attending: Family Medicine | Primary: Family Medicine

## 2021-11-13 MED ORDER — LOSARTAN POTASSIUM 50 MG PO TABS
50 MG | ORAL_TABLET | Freq: Every day | ORAL | 3 refills | Status: AC
Start: 2021-11-13 — End: 2022-12-13

## 2021-12-06 ENCOUNTER — Encounter: Payer: PRIVATE HEALTH INSURANCE | Attending: Orthopaedic Surgery | Primary: Family Medicine

## 2021-12-13 ENCOUNTER — Ambulatory Visit
Admit: 2021-12-13 | Discharge: 2021-12-13 | Payer: PRIVATE HEALTH INSURANCE | Attending: Orthopaedic Surgery | Primary: Family Medicine

## 2021-12-13 DIAGNOSIS — M1712 Unilateral primary osteoarthritis, left knee: Secondary | ICD-10-CM

## 2021-12-13 MED ORDER — TRIAMCINOLONE ACETONIDE 40 MG/ML IJ SUSP
40 MG/ML | Freq: Once | INTRAMUSCULAR | Status: DC
Start: 2021-12-13 — End: 2021-12-13

## 2021-12-14 MED ORDER — TRIAMCINOLONE ACETONIDE 40 MG/ML IJ SUSP
40 MG/ML | Freq: Once | INTRAMUSCULAR | Status: AC
Start: 2021-12-14 — End: 2021-12-14
  Administered 2021-12-14: 16:00:00 40 mg via INTRA_ARTICULAR

## 2021-12-14 NOTE — Progress Notes (Signed)
Chief Complaint:   Chief Complaint   Patient presents with    Knee Pain     Left Knee F/U, had cortisone injection on 08/02/2021 with good relief.  Pain just started back about 2 weeks ago.       Thomas Walton follows up for his left knee.  He still has problems with the knee.  The previous injection helped him for some time and he like to try that again.  He is not interested in any more aggressive treatment at this time.      Allergies; medications; past medical, surgical, family, and social history; and problem list have been reviewed today and updated as indicated in this encounter seen below.    Exam: There is some crepitus in the knee with range of motion which is very good.  There is slight medial and lateral gapping.  There is slight hypertrophic changes palpable through the soft tissues.  There is no gross instability.  There are no contraindications to injection.    Radiographs: Previous imaging shows diffuse    Changes in the knee with hypertrophic marginal changes.    ASSESSMENT:    Thomas Walton was seen today for knee pain.    Diagnoses and all orders for this visit:    Primary osteoarthritis of left knee  -     Cancel: PR ARTHROCENTESIS ASPIR&/INJ MAJOR JT/BURSA W/O [place]  -     PR ARTHROCENTESIS ASPIR&/INJ MAJOR JT/BURSA W/O [place]    Other orders  -     Discontinue: triamcinolone acetonide (KENALOG-40) injection 40 mg  -     triamcinolone acetonide (KENALOG-40) injection 40 mg        PLAN: Injectionof the left knee with  4 cc    1/4  % bupivicaine  and Kenalog   (triamcinalone)         3mwas discussed with the patient.   Discussion included but was not limited to potential risks and benefits.   No contraindications to the procedure were noted.  Understanding and agreement was indicated.  The procedure was accomplished without incident using appropriate aseptic technique.  follow up as needed      Return if symptoms worsen or fail to improve.       Current Outpatient Medications   Medication Sig Dispense Refill     losartan (COZAAR) 50 MG tablet TAKE 1 TABLET BY MOUTH DAILY 90 tablet 3    metFORMIN (GLUCOPHAGE-XR) 500 MG extended release tablet TAKE 1 TABLET BY MOUTH DAILY WITH BREAKFAST 30 tablet 5    XARELTO 20 MG TABS tablet TAKE 1 TABLET BY MOUTH DAILY WITH BREAKFAST 90 tablet 3    atorvastatin (LIPITOR) 40 MG tablet Take 1 tablet by mouth nightly 90 tablet 2    metoprolol succinate (TOPROL XL) 50 MG extended release tablet Take 1 tablet by mouth in the morning and at bedtime 180 tablet 2    FREESTYLE LITE strip Inject 1 each into the skin 4 times daily (before meals and nightly)      FreeStyle Lancets MISC Inject 1 each into the skin 4 times daily (before meals and nightly)      glucose monitoring (FREESTYLE FREEDOM) kit 1 kit by Does not apply route daily 1 kit 0     No current facility-administered medications for this visit.       Patient Active Problem List   Diagnosis    Paroxysmal atrial fibrillation (HCC)    Chronic anticoagulation    Type 2 diabetes mellitus  without complication, without long-term current use of insulin (HCC)    OSA (obstructive sleep apnea)    Cardiovascular risk factor    Snoring    Primary hypertension    Microalbuminuria    Asymptomatic microscopic hematuria    Muscle tension pain    Chronic pain of left knee       Past Medical History:   Diagnosis Date    Asthma     Diabetes ([place])     HLD (hyperlipidemia)     HTN (hypertension)     PAF (paroxysmal atrial fibrillation) (HCC)        Past Surgical History:   Procedure Laterality Date    CARDIAC CATHETERIZATION  12/12/2020       Allergies   Allergen Reactions    Shellfish-Derived Products Anaphylaxis     Crab, lobster, shrimp, etc.       Social History     Socioeconomic History    Marital status: Social worker     Spouse name: None    Number of children: None    Years of education: None    Highest education level: None   Tobacco Use    Smoking status: Never    Smokeless tobacco: Never   Vaping Use    Vaping Use: Never used   Substance and  Sexual Activity    Alcohol use: Yes     Comment: rare    Drug use: Never   Social History Narrative    Drinks 1 cup of coffee/week     Social Determinants of Company secretary Strain: Low Risk     Difficulty of Paying Living Expenses: Not hard at all   Food Insecurity: No Food Insecurity    Worried About Charity fundraiser in the Last Year: Never true    Arboriculturist in the Last Year: Never true   Transportation Needs: No Transportation Needs    Lack of Transportation (Medical): No    Lack of Transportation (Non-Medical): No   Physical Activity: Insufficiently Active    Days of Exercise per Week: 3 days    Minutes of Exercise per Session: 10 min   Stress: Stress Concern Present    Feeling of Stress : Very much   Social Connections: Moderately Integrated    Frequency of Communication with Friends and Family: More than three times a week    Frequency of Social Gatherings with Friends and Family: More than three times a week    Attends Religious Services: Never    Marine scientist or Organizations: Yes    Attends Music therapist: More than 4 times per year    Marital Status: Married   Human resources officer Violence: Not At Risk    Fear of Current or Ex-Partner: No    Emotionally Abused: No    Physically Abused: No    Sexually Abused: No   Housing Stability: Low Risk     Unable to Pay for Housing in the Last Year: No    Number of [place] in the Last Year: 1    Unstable Housing in the Last Year: No       Review of Systems  As follows except as previously noted in HPI:  Constitutional: Negative for chills, diaphoresis, fatigue, fever and unexpected weight change.   Respiratory: Negative for cough, shortness of breath and wheezing.    Cardiovascular: Negative for chest pain and palpitations.   Neurological: Negative for dizziness, syncope,  cephalgia.  GI / GU: negative  Musculoskeletal: see HPI       Objective:   Physical Exam   Constitutional: Oriented to person, place, and time. and  appears well-developed and well-nourished. :   Head: Normocephalic and atraumatic.   Eyes: EOM are normal.   Neck: Neck supple.   Cardiovascular: Normal rate and regular rhythm.    Pulmonary/Chest: Effort normal. No stridor. No respiratory distress, no wheezes.   Abdominal:  No abnormal distension.    Neurological: Alert and oriented to person, place, and time.   Skin: Skin is warm and dry.   Psychiatric: Normal mood and affect. Behavior is normal. Thought content normal.    Makaylia Hewett Ardine Eng, DO    12/14/21  10:37 AM

## 2021-12-15 ENCOUNTER — Emergency Department: Admit: 2021-12-16 | Payer: PRIVATE HEALTH INSURANCE | Primary: Family Medicine

## 2021-12-15 DIAGNOSIS — S7012XA Contusion of left thigh, initial encounter: Secondary | ICD-10-CM

## 2021-12-15 DIAGNOSIS — W19XXXA Unspecified fall, initial encounter: Secondary | ICD-10-CM

## 2021-12-15 NOTE — ED Notes (Signed)
Patient declined ice pack at this time.      Thomas Walton  12/15/21 1947

## 2021-12-15 NOTE — ED Provider Notes (Signed)
Andrick Rust is a 50 year old male with a past medical history of diabetes, A. fib on Xarelto, hypertension, OSA who presents emergency department after mechanical fall that occurred around 9 AM this morning.  Patient slipped and fell down several steps.  Patient did not hit his head or lose consciousness.  Patient fell on his left hip.  Patient has been able to ambulate since the fall.  Patient noticed some swelling and change in skin color to the left side.  Patient went to work today and after he noticed the skin color change he went to urgent care urgent care center emergency department for evaluation.  Patient denies lightheadedness, dizziness, syncope.  Patient denies nausea, vomiting, abdominal pain patient not hit his head patient not lose consciousness patient not have any abdominal injuries or back injuries.  Patient has been ambulatory    The history is provided by the patient, a relative and medical records.      Review of Systems   Constitutional:  Negative for chills, diaphoresis, fatigue and fever.   Eyes:  Negative for photophobia and visual disturbance.   Respiratory:  Negative for cough, chest tightness and shortness of breath.    Cardiovascular:  Negative for chest pain, palpitations and leg swelling.   Gastrointestinal:  Negative for abdominal distention, abdominal pain, diarrhea, nausea and vomiting.   Genitourinary:  Negative for dysuria.   Musculoskeletal:  Negative for back pain, neck pain and neck stiffness.   Skin:  Positive for color change. Negative for pallor and rash.   Neurological:  Negative for headaches.   Psychiatric/Behavioral:  Negative for confusion.       Physical Exam  Vitals and nursing note reviewed.   Constitutional:       General: He is not in acute distress.     Appearance: Normal appearance. He is not ill-appearing.   HENT:      Head: Normocephalic and atraumatic.   Eyes:      General: No scleral icterus.     Conjunctiva/sclera: Conjunctivae normal.      Pupils:  Pupils are equal, round, and reactive to light.   Cardiovascular:      Rate and Rhythm: Normal rate and regular rhythm.   Pulmonary:      Effort: Pulmonary effort is normal.      Breath sounds: Normal breath sounds.   Abdominal:      General: Bowel sounds are normal. There is no distension.      Palpations: Abdomen is soft.      Tenderness: There is no abdominal tenderness. There is no guarding or rebound.   Musculoskeletal:         General: Swelling, tenderness and signs of injury present.      Cervical back: Normal range of motion and neck supple. No rigidity. No muscular tenderness.      Right lower leg: No edema.      Left lower leg: No edema.      Comments: Patient is able to lift left leg off of the bed  Patient has DP PT pulses intact left lower extremity mild tenderness over left knee no joint effusion  Patient denies any open wounds patient does have ecchymosis and tenderness to left gluteal area extending to the mid thigh left side patient does not have findings concerning for compartment syndrome     Skin:     General: Skin is warm and dry.      Capillary Refill: Capillary refill takes less than 2 seconds.  Coloration: Skin is not pale.      Findings: No erythema or rash.   Neurological:      Mental Status: He is alert and oriented to person, place, and time.   Psychiatric:         Mood and Affect: Mood normal.        Procedures     MDM  Number of Diagnoses or Management Options  Fall, initial encounter  Hematoma  Hyperglycemia  Diagnosis management comments: Nina Hoar is a 50 year old male presents emergency department concern for mechanical fall and hematoma to left hip area.  Patient is on anticoagulation patient was found to have a hematoma imaging was unremarkable for other acute process.  Patient was well-appearing at repeat evaluation.    Patient is also found to have hyperglycemia not in DKA.  Patient was given IV fluids repeat blood glucose significantly improved  Patient was advised to  hold anticoagulation for 24 hours patient advised return to emergency department symptoms worsen or not improve he is agreeable to plan and well-appearing at time of discharge advised to return immediately to emergency department if he has any worsening or expanding hematoma patient is agreeable to plan neurovascular intact and ambulated normally in emergency department                 --------------------------------------------- PAST HISTORY ---------------------------------------------  Past Medical History:  has a past medical history of Asthma, Diabetes (HCC), HLD (hyperlipidemia), HTN (hypertension), and PAF (paroxysmal atrial fibrillation) (HCC).    Past Surgical History:  has a past surgical history that includes Cardiac catheterization (12/12/2020).    Social History:  reports that he has never smoked. He has never used smokeless tobacco. He reports current alcohol use. He reports that he does not use drugs.    Family History: family history includes Heart Attack in his father.     The patient???s home medications have been reviewed.    Allergies: Shellfish-derived products    -------------------------------------------------- RESULTS -------------------------------------------------  Labs:  Results for orders placed or performed during the hospital encounter of 12/15/21   CBC with Auto Differential   Result Value Ref Range    WBC 8.6 4.5 - 11.5 E9/L    RBC 4.56 3.80 - 5.80 E12/L    Hemoglobin 13.1 12.5 - 16.5 g/dL    Hematocrit 82.9 56.2 - 54.0 %    MCV 86.6 80.0 - 99.9 fL    MCH 28.7 26.0 - 35.0 pg    MCHC 33.2 32.0 - 34.5 %    RDW 14.3 11.5 - 15.0 fL    Platelets 252 130 - 450 E9/L    MPV 8.7 7.0 - 12.0 fL    Neutrophils % 67.4 43.0 - 80.0 %    Immature Granulocytes % 0.5 0.0 - 5.0 %    Lymphocytes % 23.3 20.0 - 42.0 %    Monocytes % 8.7 2.0 - 12.0 %    Eosinophils % 0.0 0.0 - 6.0 %    Basophils % 0.1 0.0 - 2.0 %    Neutrophils Absolute 5.77 1.80 - 7.30 E9/L    Immature Granulocytes # 0.04 E9/L     Lymphocytes Absolute 1.99 1.50 - 4.00 E9/L    Monocytes Absolute 0.74 0.10 - 0.95 E9/L    Eosinophils Absolute 0.00 (L) 0.05 - 0.50 E9/L    Basophils Absolute 0.01 0.00 - 0.20 E9/L   Comprehensive Metabolic Panel w/ Reflex to MG   Result Value Ref Range    Sodium 134 132 -  146 mmol/L    Potassium reflex Magnesium 4.5 3.5 - 5.0 mmol/L    Chloride 100 98 - 107 mmol/L    CO2 25 22 - 29 mmol/L    Anion Gap 9 7 - 16 mmol/L    Glucose 494 (H) 74 - 99 mg/dL    BUN 21 (H) 6 - 20 mg/dL    Creatinine 1.2 0.7 - 1.2 mg/dL    Est, Glom Filt Rate >60 >=60 mL/min/1.73    Calcium 9.7 8.6 - 10.2 mg/dL    Total Protein 7.3 6.4 - 8.3 g/dL    Albumin 4.4 3.5 - 5.2 g/dL    Total Bilirubin 0.3 0.0 - 1.2 mg/dL    Alkaline Phosphatase 97 40 - 129 U/L    ALT 27 0 - 40 U/L    AST 15 0 - 39 U/L   Lactic Acid   Result Value Ref Range    Lactic Acid 1.9 0.5 - 2.2 mmol/L       Radiology:  XR KNEE LEFT (1-2 VIEWS)   Final Result   No acute disease.      RECOMMENDATION:   Careful clinical correlation and follow up recommended.         CT PELVIS W CONTRAST Additional Contrast? None   Final Result   No acute fracture or subluxation.      RECOMMENDATIONS:   Careful clinical correlation and follow up recommended.             ------------------------- NURSING NOTES AND VITALS REVIEWED ---------------------------  Date / Time Roomed:  12/15/2021  8:00 PM  ED Bed Assignment:  02/02    The nursing notes within the ED encounter and vital signs as below have been reviewed.   BP (!) 151/86    Pulse 88    Temp 98.4 ??F (36.9 ??C) (Oral)    Resp 16    SpO2 97%   Oxygen Saturation Interpretation: Normal      ------------------------------------------ PROGRESS NOTES ------------------------------------------  11:40 PM EST  I have spoken with the patient and discussed today???s results, in addition to providing specific details for the plan of care and counseling regarding the diagnosis and prognosis.  Their questions are answered at this time and they are agreeable  with the plan. I discussed at length with them reasons for immediate return here for re evaluation. They will followup with their primary care physician by calling their office tomorrow.      --------------------------------- ADDITIONAL PROVIDER NOTES ---------------------------------  At this time the patient is without objective evidence of an acute process requiring hospitalization or inpatient management.  They have remained hemodynamically stable throughout their entire ED visit and are stable for discharge with outpatient follow-up.     The plan has been discussed in detail and they are aware of the specific conditions for emergent return, as well as the importance of follow-up.      New Prescriptions    No medications on file       Diagnosis:  1. Fall, initial encounter    2. Hematoma    3. Hyperglycemia        Disposition:  Patient's disposition: Discharge to home  Patient's condition is stable.          Koleen Nimrod, MD  12/17/21 [phone]

## 2021-12-16 ENCOUNTER — Inpatient Hospital Stay
Admit: 2021-12-16 | Discharge: 2021-12-16 | Disposition: A | Discharge status: Home / Self Care | Payer: PRIVATE HEALTH INSURANCE | Attending: Emergency Medicine

## 2021-12-16 LAB — CBC WITH AUTO DIFFERENTIAL
Basophils %: 0.1 % (ref 0.0–2.0)
Basophils Absolute: 0.01 E9/L (ref 0.00–0.20)
Eosinophils %: 0 % (ref 0.0–6.0)
Eosinophils Absolute: 0 E9/L — ABNORMAL LOW (ref 0.05–0.50)
Hematocrit: 39.5 % (ref 37.0–54.0)
Hemoglobin: 13.1 g/dL (ref 12.5–16.5)
Immature Granulocytes #: 0.04 E9/L
Immature Granulocytes %: 0.5 % (ref 0.0–5.0)
Lymphocytes %: 23.3 % (ref 20.0–42.0)
Lymphocytes Absolute: 1.99 E9/L (ref 1.50–4.00)
MCH: 28.7 pg (ref 26.0–35.0)
MCHC: 33.2 % (ref 32.0–34.5)
MCV: 86.6 fL (ref 80.0–99.9)
MPV: 8.7 fL (ref 7.0–12.0)
Monocytes %: 8.7 % (ref 2.0–12.0)
Monocytes Absolute: 0.74 E9/L (ref 0.10–0.95)
Neutrophils %: 67.4 % (ref 43.0–80.0)
Neutrophils Absolute: 5.77 E9/L (ref 1.80–7.30)
Platelets: 252 E9/L (ref 130–450)
RBC: 4.56 E12/L (ref 3.80–5.80)
RDW: 14.3 fL (ref 11.5–15.0)
WBC: 8.6 E9/L (ref 4.5–11.5)

## 2021-12-16 LAB — POCT GLUCOSE
Glucose: 288 mg/dL
Meter Glucose: 288 mg/dL — ABNORMAL HIGH (ref 74–99)

## 2021-12-16 LAB — COMPREHENSIVE METABOLIC PANEL W/ REFLEX TO MG FOR LOW K
ALT: 27 U/L (ref 0–40)
AST: 15 U/L (ref 0–39)
Albumin: 4.4 g/dL (ref 3.5–5.2)
Alkaline Phosphatase: 97 U/L (ref 40–129)
Anion Gap: 9 mmol/L (ref 7–16)
BUN: 21 mg/dL — ABNORMAL HIGH (ref 6–20)
CO2: 25 mmol/L (ref 22–29)
Calcium: 9.7 mg/dL (ref 8.6–10.2)
Chloride: 100 mmol/L (ref 98–107)
Creatinine: 1.2 mg/dL (ref 0.7–1.2)
Est, Glom Filt Rate: >60 mL/min/{1.73_m2} (ref >=60)
Glucose: 494 mg/dL — ABNORMAL HIGH (ref 74–99)
Potassium reflex Magnesium: 4.5 mmol/L (ref 3.5–5.0)
Sodium: 134 mmol/L (ref 132–146)
Total Bilirubin: 0.3 mg/dL (ref 0.0–1.2)
Total Protein: 7.3 g/dL (ref 6.4–8.3)

## 2021-12-16 LAB — LACTIC ACID: Lactic Acid: 1.9 mmol/L (ref 0.5–2.2)

## 2021-12-16 MED ORDER — SODIUM CHLORIDE 0.9 % IV BOLUS
0.9 % | Freq: Once | INTRAVENOUS | Status: AC
Start: 2021-12-16 — End: 2021-12-15
  Administered 2021-12-16: 04:00:00 1000 mL via INTRAVENOUS

## 2021-12-16 MED ORDER — METHYLPREDNISOLONE SODIUM SUCC 125 MG IJ SOLR
125 MG | Freq: Once | INTRAMUSCULAR | Status: AC
Start: 2021-12-16 — End: 2021-12-15
  Administered 2021-12-16: 02:00:00 125 mg via INTRAVENOUS

## 2021-12-16 MED ORDER — DIPHENHYDRAMINE HCL 50 MG/ML IJ SOLN
50 MG/ML | Freq: Once | INTRAMUSCULAR | Status: AC
Start: 2021-12-16 — End: 2021-12-15
  Administered 2021-12-16: 02:00:00 25 mg via INTRAVENOUS

## 2021-12-16 MED ORDER — IOPAMIDOL 76 % IV SOLN
76 % | Freq: Once | INTRAVENOUS | Status: AC | PRN
Start: 2021-12-16 — End: 2021-12-15
  Administered 2021-12-16: 03:00:00 50 mL via INTRAVENOUS

## 2021-12-16 MED FILL — SOLU-MEDROL 125 MG IJ SOLR: 125 mg | INTRAMUSCULAR | Qty: 125

## 2021-12-16 MED FILL — DIPHENHYDRAMINE HCL 50 MG/ML IJ SOLN: 50 mg/mL | INTRAMUSCULAR | Qty: 1

## 2021-12-19 ENCOUNTER — Encounter: Payer: PRIVATE HEALTH INSURANCE | Attending: Critical Care Medicine | Primary: Family Medicine

## 2021-12-20 ENCOUNTER — Ambulatory Visit
Admit: 2021-12-20 | Discharge: 2021-12-20 | Payer: PRIVATE HEALTH INSURANCE | Attending: Family Medicine | Primary: Family Medicine

## 2021-12-20 ENCOUNTER — Emergency Department: Admit: 2021-12-20 | Payer: PRIVATE HEALTH INSURANCE | Primary: Family Medicine

## 2021-12-20 ENCOUNTER — Inpatient Hospital Stay
Admit: 2021-12-20 | Discharge: 2021-12-20 | Disposition: A | Discharge status: Home / Self Care | Payer: PRIVATE HEALTH INSURANCE

## 2021-12-20 DIAGNOSIS — E119 Type 2 diabetes mellitus without complications: Secondary | ICD-10-CM

## 2021-12-20 DIAGNOSIS — S7012XA Contusion of left thigh, initial encounter: Secondary | ICD-10-CM

## 2021-12-20 LAB — GLUCOSE, RANDOM: Glucose: 306 mg/dL — ABNORMAL HIGH (ref 74–99)

## 2021-12-20 LAB — POCT GLUCOSE: Meter Glucose: 243 mg/dL — ABNORMAL HIGH (ref 74–99)

## 2021-12-20 LAB — CBC WITH AUTO DIFFERENTIAL
Basophils %: 0.4 % (ref 0.0–2.0)
Basophils Absolute: 0.03 E9/L (ref 0.00–0.20)
Eosinophils %: 2.2 % (ref 0.0–6.0)
Eosinophils Absolute: 0.17 E9/L (ref 0.05–0.50)
Hematocrit: 40.6 % (ref 37.0–54.0)
Hemoglobin: 13.5 g/dL (ref 12.5–16.5)
Immature Granulocytes #: 0.04 E9/L
Immature Granulocytes %: 0.5 % (ref 0.0–5.0)
Lymphocytes %: 31.8 % (ref 20.0–42.0)
Lymphocytes Absolute: 2.48 E9/L (ref 1.50–4.00)
MCH: 28.5 pg (ref 26.0–35.0)
MCHC: 33.3 % (ref 32.0–34.5)
MCV: 85.8 fL (ref 80.0–99.9)
MPV: 8.6 fL (ref 7.0–12.0)
Monocytes %: 5.9 % (ref 2.0–12.0)
Monocytes Absolute: 0.46 E9/L (ref 0.10–0.95)
Neutrophils %: 59.2 % (ref 43.0–80.0)
Neutrophils Absolute: 4.61 E9/L (ref 1.80–7.30)
Platelets: 246 E9/L (ref 130–450)
RBC: 4.73 E12/L (ref 3.80–5.80)
RDW: 14.1 fL (ref 11.5–15.0)
WBC: 7.8 E9/L (ref 4.5–11.5)

## 2021-12-20 LAB — POCT MICROALBUMIN
Creatinine Ur POCT: 300
Microalb, Ur: 150

## 2021-12-20 LAB — POCT GLYCOSYLATED HEMOGLOBIN (HGB A1C): Hemoglobin A1C: 10.6 %

## 2021-12-20 MED ORDER — HYDROCODONE-ACETAMINOPHEN 5-325 MG PO TABS
5-325 MG | ORAL_TABLET | Freq: Two times a day (BID) | ORAL | 0 refills | Status: AC | PRN
Start: 2021-12-20 — End: 2021-12-23

## 2021-12-20 MED ORDER — CEPHALEXIN 500 MG PO CAPS
500 MG | ORAL_CAPSULE | Freq: Four times a day (QID) | ORAL | 0 refills | Status: AC
Start: 2021-12-20 — End: 2021-12-30

## 2021-12-20 MED ORDER — EMPAGLIFLOZIN 25 MG PO TABS
25 MG | ORAL_TABLET | Freq: Every day | ORAL | 1 refills | Status: DC
Start: 2021-12-20 — End: 2022-06-13

## 2021-12-20 NOTE — ED Provider Notes (Signed)
[facility]     [facility]  Department of Emergency Medicine   ED  Encounter Note  Admit Date/RoomTime: 12/20/2021 10:31 AM  ED Room: 12/12  NAME: Thomas Walton  DOB: 10-Oct-1971  MRN: [id number]     Chief Complaint:  Hematoma (Left leg hematoma after a fall on Friday. )    HISTORY OF PRESENT ILLNESS        Thomas Walton is a 50 y.o. male who presents to the ED for reevaluation of hematoma left thigh.  Patient does have a history of high blood pressure, diabetes and is on Xarelto for atrial fibrillation.  5 days ago patient fell down 14 steps.  He was initially evaluated at urgent care and then sent here to the emergency department.  Patient's only complaint really was the left hip injury and pain.  He did have a CT of the left hip that showed negative for fracture.  Negative for extravasation of any blood.  Blood work was within normal limits and patient was discharged home.  He followed back up with his PCP today and was sent over for another evaluation.  Patient and spouse at bedside are concerned that the hematoma is getting larger.  It is spreading.  Doctor question whether he should still be on the Xarelto.  Rates the discomfort a 7 out of 10.  Not really taking any medication for this complaint.  He is still independently walking.  Still going to work.      ROS   Pertinent positives and negatives are stated within HPI, all other systems reviewed and are negative.    Past Medical History:  has a past medical history of Asthma, Diabetes (HCC), HLD (hyperlipidemia), HTN (hypertension), and PAF (paroxysmal atrial fibrillation) (HCC).    Surgical History:  has a past surgical history that includes Cardiac catheterization (12/12/2020).    Social History:  reports that he has never smoked. He has never used smokeless tobacco. He reports current alcohol use. He reports that he does not use drugs.    Family History: family history includes Heart Attack in his father.     Allergies:  Shellfish-derived products    PHYSICAL EXAM   Oxygen Saturation Interpretation: Normal on room air analysis.        ED Triage Vitals [12/20/21 1028]   BP Temp Temp Source Heart Rate Resp SpO2 Height Weight   132/82 98.3 ??F (36.8 ??C) Oral 79 16 99 % -- --         General:  NAD.  Alert and Oriented.  Well-appearing.  Skin:  Warm, dry.  No rashes.  Head:  Normocephalic.  Atraumatic.  Eyes:  EOMI.  Conjunctiva normal.  ENT:  Oral mucosa moist.  Airway patent.  Neck:  Supple.  Normal ROM.    Respiratory:  No respiratory distress.  No labored breathing.  Lungs clear without rales, rhonchi or wheezing.  Cardiovascular:  Regular rate.  No Murmur.  No peripheral edema.  Extremities warm and good color.  Extremities:    Patient does have a large hematoma from the mid to distal left buttock into the left hip and left thigh.  Bottom portion extends to about mid left thigh.  The center of the hematoma is firm.  Indurated.  Slightly warm to the touch.  Center of the hematoma also seems erythematous but is dark purple around the borders.  Patient was able to independently stand from the bed and then sit and raise both legs back  up onto the cot.  Flexion and extension is intact at the left hip.  Flexion and extension is intact to the left knee.  Left calf is not swollen.  2+ left dorsalis pedis pulse.  Back:  Normal ROM.  Nontender to palpation.  Neuro:  Alert and Oriented to person, place, time and situation.  Normal LOC.  Moves all extremities.  Speech fluent.  Psych:  Calm and Cooperative.  Normal thought process.  Normal judgement.    Lab / Imaging Results   (All laboratory and radiology results have been personally reviewed by myself)  Labs:  Results for orders placed or performed during the hospital encounter of 12/20/21   CBC with Auto Differential   Result Value Ref Range    WBC 7.8 4.5 - 11.5 E9/L    RBC 4.73 3.80 - 5.80 E12/L    Hemoglobin 13.5 12.5 - 16.5 g/dL    Hematocrit 40.9 81.1 - 54.0 %    MCV 85.8 80.0 - 99.9 fL     MCH 28.5 26.0 - 35.0 pg    MCHC 33.3 32.0 - 34.5 %    RDW 14.1 11.5 - 15.0 fL    Platelets 246 130 - 450 E9/L    MPV 8.6 7.0 - 12.0 fL    Neutrophils % 59.2 43.0 - 80.0 %    Immature Granulocytes % 0.5 0.0 - 5.0 %    Lymphocytes % 31.8 20.0 - 42.0 %    Monocytes % 5.9 2.0 - 12.0 %    Eosinophils % 2.2 0.0 - 6.0 %    Basophils % 0.4 0.0 - 2.0 %    Neutrophils Absolute 4.61 1.80 - 7.30 E9/L    Immature Granulocytes # 0.04 E9/L    Lymphocytes Absolute 2.48 1.50 - 4.00 E9/L    Monocytes Absolute 0.46 0.10 - 0.95 E9/L    Eosinophils Absolute 0.17 0.05 - 0.50 E9/L    Basophils Absolute 0.03 0.00 - 0.20 E9/L   Glucose, Random   Result Value Ref Range    Glucose 306 (H) 74 - 99 mg/dL   POCT Glucose   Result Value Ref Range    Meter Glucose 243 (H) 74 - 99 mg/dL     Imaging:  All Radiology results interpreted by Radiologist unless otherwise noted.  [place] DUP LOWER EXTREMITY LEFT VEN   Final Result   No evidence of DVT in the left lower extremity.             ED Course / Medical Decision Making   Medications - No data to display     Re-examination:  12/20/21       Time:   Patient???s condition .    Consult(s):   None    Procedure(s):   None    MDM:  ED COURSE / MEDICAL DECISION MAKING    Recap: 50 year old male with an area large left thigh hematoma from traumatic fall.  Also on Xarelto.  CT pelvis showed hematoma, no extravasation on December 16.    History was provided by patient and there no social determinants affecting patient care.  Records Reviewed: Previous labs and imaging  Results/Interventions: Ultrasound today shows hematoma, no active bleeding, no subcutaneous air.  CBC is stable.  Blood sugar slightly elevated.  Diagnosis of hematoma and patient advised to use warm compresses.  Explained that it will travel down the leg and fall with gravity.  Also diagnosis of cellulitis as it is slightly warm and erythematous.  Plus the risk factor  of diabetes and patient will be covered with Keflex.  Advised him to continue the  Xarelto as the benefit of the Xarelto certainly outweighs the risk of bleeding when at this time there is no evidence of active bleeding.    Differential Diagnoses considered but not fully inclusive: Actively bleeding hematoma.  Cellulitis.  Complication of Xarelto.    I am Primary Clinician of Record and case not discussed with ED Physician.    Diagnosis, Disposition and any Prescriptions as follows      Plan of Care/Counseling:  Soyla Murphy, [place] reviewed today's visit with the patient in addition to providing specific details for the plan of care and counseling regarding the diagnosis and prognosis.  Questions are answered at this time and are agreeable with the plan.    ASSESSMENT     1. Hematoma of left thigh, initial encounter    2. Cellulitis of left thigh      PLAN   Discharged home.  Patient condition is good    New Medications     New Prescriptions    CEPHALEXIN (KEFLEX) 500 MG CAPSULE    Take 1 capsule by mouth 4 times daily for 10 days    HYDROCODONE-ACETAMINOPHEN (NORCO) 5-325 MG PER TABLET    Take 1 tablet by mouth every 12 hours as needed for Pain for up to 3 days. Max Daily Amount: 2 tablets     Electronically signed by Soyla Murphy, PA   DD: 12/20/21  **This report was transcribed using voice recognition software. Every effort was made to ensure accuracy; however, inadvertent computerized transcription errors may be present.  END OF ED PROVIDER NOTE       Soyla Murphy, [place]  12/20/21 1313

## 2021-12-20 NOTE — ED Notes (Signed)
Pt given discharge summary with education on cellulitis and hematoma, pt also aware of prescriptions at assigned pharmacy and informed to follow up with his primary care physician     Vivien Rossetti, RN  12/20/21 1337

## 2021-12-20 NOTE — Progress Notes (Signed)
Thomas Walton (DOB:  1971-11-25) is a 50 y.o. male, established patient follow up , here for evaluation of the following:  Follow-up (Ed follow up )         ASSESSMENT/PLAN      1. Type 2 diabetes mellitus without complication, without long-term current use of insulin (HCC)  Chronic, not well controlled, A1c above 10, will start Jardiance, and healthy lifestyle changes, will need to repeat microalbumin creatinine ratio at next visit as he does have proteinuria  -     POCT glycosylated hemoglobin (Hb A1C)  -     POCT microalbumin  -     empagliflozin (JARDIANCE) 25 MG tablet; Take 1 tablet by mouth daily, Disp-90 tablet, R-1Normal  2. Hematoma  Patient with large hematoma after falling down the stairs, was seen in the emergency department, CT without any fracture, wife has pictures of how hematoma has grown, the response discontinue the Xarelto however did not remember after 1 emergency room visit to do that, he is feeling slightly lightheaded here in the office, he did take Xarelto this morning, will send him to the emergency department as he may need reversal of the Xarelto which cannot really be done in the outpatient, also concern for continued bleeding, repeat imaging may need to be completed, patient is wife are in agreement to go to the emergency department for further management  3. Other proteinuria   Positive protein on microalbumin creatinine ratio, will repeat at next visit, Jardiance should help prevent further progression  Return in about 3 months (around 03/20/2022) for Diabetes follow up.         Subjective   SUBJECTIVE/OBJECTIVE:  HPI  He is here for follow up. He was seen for a fall. He had a hematoma. He is on xeralto. He notes its getting bigger.  He did have imaging in the emergency department was negative.  He does note that he has some lightheadedness although he has not eaten yet today as well    He notes he has a lot of stress at work, he does stress eat and has inappropriate meals, was  still surprised that his A1c was at 10    Picture of hematoma does show large purpleish bruise covering almost all of his lateral thigh extending into his buttock region the central redder more firm area       Declined preventative screening identified as care gaps unless ordered through this visit    PHQ2/PHQ9      No data recorded     Past Medical History:  has a past medical history of Asthma, Diabetes ([place]), HLD (hyperlipidemia), HTN (hypertension), and PAF (paroxysmal atrial fibrillation) ([place]).   Past Surgical History:  has a past surgical history that includes Cardiac catheterization (12/12/2020).  Social History:  reports that he has never smoked. He has never used smokeless tobacco. He reports current alcohol use. He reports that he does not use drugs.  Family History: family history includes Heart Attack in his father.  Allergies: Shellfish-derived products  Medications:   Current Outpatient Medications   Medication Sig Dispense Refill    empagliflozin (JARDIANCE) 25 MG tablet Take 1 tablet by mouth daily 90 tablet 1    losartan (COZAAR) 50 MG tablet TAKE 1 TABLET BY MOUTH DAILY 90 tablet 3    metFORMIN (GLUCOPHAGE-XR) 500 MG extended release tablet TAKE 1 TABLET BY MOUTH DAILY WITH BREAKFAST 30 tablet 5    XARELTO 20 MG TABS tablet TAKE 1 TABLET BY MOUTH  DAILY WITH BREAKFAST 90 tablet 3    atorvastatin (LIPITOR) 40 MG tablet Take 1 tablet by mouth nightly 90 tablet 2    metoprolol succinate (TOPROL XL) 50 MG extended release tablet Take 1 tablet by mouth in the morning and at bedtime 180 tablet 2    FREESTYLE LITE strip Inject 1 each into the skin 4 times daily (before meals and nightly)      FreeStyle Lancets MISC Inject 1 each into the skin 4 times daily (before meals and nightly)      glucose monitoring (FREESTYLE FREEDOM) kit 1 kit by Does not apply route daily 1 kit 0     No current facility-administered medications for this visit.      Allergies: Shellfish-derived products     Review of Systems    Constitutional:  Negative for activity change, appetite change, fatigue, fever and unexpected weight change.   HENT:  Negative for trouble swallowing.    Eyes:  Negative for visual disturbance.   Respiratory:  Negative for shortness of breath.    Cardiovascular:  Negative for chest pain.   Gastrointestinal:  Negative for abdominal pain.   Musculoskeletal:  Negative for arthralgias.   Neurological:  Negative for weakness, light-headedness and headaches.   Psychiatric/Behavioral:  Negative for confusion and sleep disturbance.    All other systems reviewed and are negative.       Objective   BP 125/85 (Site: Left Upper Arm, Position: Sitting, Cuff Size: Large Adult)    Pulse 81    Temp (!) 96.5 ??F (35.8 ??C) (Temporal)    Resp 20    Ht '6\' 2"'$  (1.88 m)    Wt 281 lb 6.4 oz (127.6 kg)    SpO2 98%    BMI 36.13 kg/m??       Lab Results   Component Value Date    LABA1C 10.6 12/20/2021    LABA1C 8.1 07/10/2021    LABA1C 8.6 04/11/2021     Lab Results   Component Value Date    CHOL 220 (H) 12/06/2020     Lab Results   Component Value Date    TRIG 334 (H) 12/06/2020     Lab Results   Component Value Date    HDL 33 12/06/2020     Lab Results   Component Value Date    LDLCALC 120 (H) 12/06/2020     Lab Results   Component Value Date    LABVLDL 67 12/06/2020     No results found for: CHOLHDLRATIO   Creatinine   Date Value Ref Range Status   12/15/2021 1.2 0.7 - 1.2 mg/dL Final   04/30/2021 1.1 0.7 - 1.2 mg/dL Final   12/12/2020 1.1 0.7 - 1.2 mg/dL Final       The 10-year ASCVD risk score (Arnett DK, et al., 2019) is: 17.8%    Values used to calculate the score:      Age: 13 years      Sex: Male      Is Non-Hispanic African American: Yes      Diabetic: Yes      Tobacco smoker: No      Systolic Blood Pressure: 784 mmHg      Is BP treated: Yes      HDL Cholesterol: 33 mg/dL      Total Cholesterol: 220 mg/dL     Physical Exam  Constitutional:       General: He is not in acute distress.     Appearance: Normal appearance.  HENT:       Head: Normocephalic and atraumatic.      Right Ear: External ear normal.      Left Ear: External ear normal.      Nose: Nose normal.      Mouth/Throat:      Mouth: Mucous membranes are moist.   Eyes:      Extraocular Movements: Extraocular movements intact.      Conjunctiva/sclera: Conjunctivae normal.   Cardiovascular:      Rate and Rhythm: Normal rate and regular rhythm.      Heart sounds: No murmur heard.  Pulmonary:      Effort: Pulmonary effort is normal.      Breath sounds: Normal breath sounds. No wheezing.   Musculoskeletal:         General: Normal range of motion.   Neurological:      General: No focal deficit present.      Mental Status: He is alert.   Psychiatric:         Mood and Affect: Mood normal.         Behavior: Behavior normal.           An electronic signature was used to authenticate this note.    --Gillian Shields, MD       *NOTE: This report was transcribed using voice recognition software. Every effort was made to ensure accuracy; however, inadvertent computerized transcription errors may be present.

## 2022-01-03 MED ORDER — METOPROLOL SUCCINATE ER 50 MG PO TB24
50 MG | ORAL_TABLET | Freq: Two times a day (BID) | ORAL | 3 refills | Status: AC
Start: 2022-01-03 — End: 2023-02-13

## 2022-01-03 MED ORDER — ATORVASTATIN CALCIUM 40 MG PO TABS
40 MG | ORAL_TABLET | Freq: Every evening | ORAL | 3 refills | Status: AC
Start: 2022-01-03 — End: 2023-01-25

## 2022-01-12 ENCOUNTER — Ambulatory Visit
Admit: 2022-01-12 | Discharge: 2022-01-12 | Payer: PRIVATE HEALTH INSURANCE | Attending: Family Medicine | Primary: Family Medicine

## 2022-01-12 DIAGNOSIS — R222 Localized swelling, mass and lump, trunk: Secondary | ICD-10-CM

## 2022-01-12 NOTE — Progress Notes (Signed)
Thomas Walton (DOB:  11/07/1971) is a 51 y.o. male, established patient follow up , here for evaluation of the following:  ED Follow-up (Left leg. Patient states his left legs from where he fell is tender. ) and Mass (Located right side chest )         ASSESSMENT/PLAN    Patient is here for follow-up, he has made some lifestyle changes and notes his blood sugars have come down, he does have a nodule on his anterior chest wall he is concerned about, it is firm, mobile, irregular shaped, will get a ultrasound, has been there for a while without change so likely benign due to its characteristics    Also has continued hematoma after fall on Eliquis, he did go to the emergency room and it was confirmed he was no longer currently bleeding, he is currently back on the Eliquis, can take up to 12 weeks for this hematoma to reabsorb, wife thinks he possibly has infection however he has no fever, chills, trouble walking, and bruises evolving, gravity is going the bruise down towards his feet making it look larger, no further treatment required at this time  1. Nodule of anterior chest wall  -     [place] NONVASCULAR TRUNK SCAN; Future  2. Screening for colon cancer  -     FIT-DNA (Cologuard)  3. PAF (paroxysmal atrial fibrillation) ([place])  Patient does get exacerbation of A. fib when he exercises, he is therefore fearful of starting a new exercise program, he is on metoprolol but only gets mild reassurance that his heart rate will not spike now as it has in the past, he likely needs some supervised exercise through cardiac rehab to help him feel more comfortable as exercise is going to be a very important part of the control of his chronic health conditions in the future  -     [place], [employer]  4. Hematoma  No follow-ups on file.         Subjective   SUBJECTIVE/OBJECTIVE:  HPI  His sugars have 100, 100 at home. He did have one low sugar, he was very active. He also had one instance when he was not feeling  right. He tried drinking coke and he still didn't feel right. He was very tired. His blood pressure as low and he layed down and BP went back up.     Declined preventative screening identified as care gaps unless ordered through this visit    PHQ2/PHQ9      No data recorded     Past Medical History:  has a past medical history of Asthma, Diabetes ([place]), HLD (hyperlipidemia), HTN (hypertension), and PAF (paroxysmal atrial fibrillation) ([place]).   Past Surgical History:  has a past surgical history that includes Cardiac catheterization (12/12/2020).  Social History:  reports that he has never smoked. He has never used smokeless tobacco. He reports current alcohol use. He reports that he does not use drugs.  Family History: family history includes Heart Attack in his father.  Allergies: Shellfish-derived products  Medications:   Current Outpatient Medications   Medication Sig Dispense Refill   ??? metoprolol succinate (TOPROL XL) 50 MG extended release tablet Take 1 tablet by mouth in the morning and at bedtime 180 tablet 3   ??? atorvastatin (LIPITOR) 40 MG tablet Take 1 tablet by mouth at bedtime 90 tablet 3   ??? empagliflozin (JARDIANCE) 25 MG tablet Take 1 tablet by mouth daily 90 tablet 1   ???  losartan (COZAAR) 50 MG tablet TAKE 1 TABLET BY MOUTH DAILY 90 tablet 3   ??? metFORMIN (GLUCOPHAGE-XR) 500 MG extended release tablet TAKE 1 TABLET BY MOUTH DAILY WITH BREAKFAST 30 tablet 5   ??? XARELTO 20 MG TABS tablet TAKE 1 TABLET BY MOUTH DAILY WITH BREAKFAST 90 tablet 3   ??? FREESTYLE LITE strip Inject 1 each into the skin 4 times daily (before meals and nightly)     ??? FreeStyle Lancets MISC Inject 1 each into the skin 4 times daily (before meals and nightly)     ??? glucose monitoring (FREESTYLE FREEDOM) kit 1 kit by Does not apply route daily 1 kit 0     No current facility-administered medications for this visit.      Allergies: Shellfish-derived products     Review of Systems   Constitutional:  Negative for activity change,  appetite change, fatigue, fever and unexpected weight change.   HENT:  Negative for trouble swallowing.    Eyes:  Negative for visual disturbance.   Respiratory:  Negative for shortness of breath.    Cardiovascular:  Negative for chest pain.   Gastrointestinal:  Negative for abdominal pain.   Musculoskeletal:  Negative for arthralgias.        Left leg pain   Neurological:  Negative for weakness, light-headedness and headaches.   Psychiatric/Behavioral:  Negative for confusion and sleep disturbance.    All other systems reviewed and are negative.       Objective   BP 127/78 (Site: Right Upper Arm, Position: Sitting, Cuff Size: Large Adult)    Pulse 65    Resp 20    Ht 6' 2"  (1.88 m)    Wt 281 lb 6.4 oz (127.6 kg)    SpO2 96%    BMI 36.13 kg/m??       Lab Results   Component Value Date    LABA1C 10.6 12/20/2021    LABA1C 8.1 07/10/2021    LABA1C 8.6 04/11/2021     Lab Results   Component Value Date    CHOL 220 (H) 12/06/2020     Lab Results   Component Value Date    TRIG 334 (H) 12/06/2020     Lab Results   Component Value Date    HDL 33 12/06/2020     Lab Results   Component Value Date    LDLCALC 120 (H) 12/06/2020     Lab Results   Component Value Date    LABVLDL 67 12/06/2020     No results found for: CHOLHDLRATIO   Creatinine   Date Value Ref Range Status   12/15/2021 1.2 0.7 - 1.2 mg/dL Final   04/30/2021 1.1 0.7 - 1.2 mg/dL Final   12/12/2020 1.1 0.7 - 1.2 mg/dL Final       The 10-year ASCVD risk score (Arnett DK, et al., 2019) is: 16.7%    Values used to calculate the score:      Age: 66 years      Sex: Male      Is Non-Hispanic African American: Yes      Diabetic: Yes      Tobacco smoker: No      Systolic Blood Pressure: 756 mmHg      Is BP treated: Yes      HDL Cholesterol: 33 mg/dL      Total Cholesterol: 220 mg/dL     Physical Exam  Constitutional:       General: He is not in acute distress.  Appearance: Normal appearance.   HENT:      Head: Normocephalic and atraumatic.      Right Ear: External ear  normal.      Left Ear: External ear normal.      Nose: Nose normal.      Mouth/Throat:      Mouth: Mucous membranes are moist.   Eyes:      Extraocular Movements: Extraocular movements intact.      Conjunctiva/sclera: Conjunctivae normal.   Cardiovascular:      Rate and Rhythm: Normal rate and regular rhythm.      Heart sounds: No murmur heard.  Pulmonary:      Effort: Pulmonary effort is normal.      Breath sounds: Normal breath sounds. No wheezing.   Chest:       Musculoskeletal:         General: Normal range of motion.        Legs:    Neurological:      General: No focal deficit present.      Mental Status: He is alert.   Psychiatric:         Mood and Affect: Mood normal.         Behavior: Behavior normal.           An electronic signature was used to authenticate this note.    --Gillian Shields, MD       *NOTE: This report was transcribed using voice recognition software. Every effort was made to ensure accuracy; however, inadvertent computerized transcription errors may be present.

## 2022-01-16 NOTE — Progress Notes (Signed)
Patient's HM shows they are overdue for Colorectal Screening.   Care Everywhere and [employer] [facility] files searched.  No results to attach to order nor HM updated.

## 2022-01-29 ENCOUNTER — Inpatient Hospital Stay: Admit: 2022-01-29 | Payer: PRIVATE HEALTH INSURANCE | Primary: Family Medicine

## 2022-01-29 DIAGNOSIS — R222 Localized swelling, mass and lump, trunk: Secondary | ICD-10-CM

## 2022-02-09 ENCOUNTER — Encounter: Payer: PRIVATE HEALTH INSURANCE | Attending: Orthopaedic Surgery | Primary: Family Medicine

## 2022-03-06 ENCOUNTER — Encounter: Payer: PRIVATE HEALTH INSURANCE | Attending: Cardiovascular Disease | Primary: Family Medicine

## 2022-03-20 ENCOUNTER — Ambulatory Visit
Admit: 2022-03-20 | Discharge: 2022-03-20 | Payer: PRIVATE HEALTH INSURANCE | Attending: Family Medicine | Primary: Family Medicine

## 2022-03-20 DIAGNOSIS — E119 Type 2 diabetes mellitus without complications: Secondary | ICD-10-CM

## 2022-03-20 LAB — POCT MICROALBUMIN
Creatinine Ur POCT: 200
Microalb, Ur: 80

## 2022-03-20 LAB — POCT GLYCOSYLATED HEMOGLOBIN (HGB A1C): Hemoglobin A1C: 7 %

## 2022-03-20 NOTE — Progress Notes (Signed)
Thomas Walton (DOB:  [birth year]) is a 51 y.o. male, established patient follow up , here for evaluation of the following:  Diabetes         ASSESSMENT/PLAN  Medication management by continuing current medications for diabetes and hypertension, no changes to medications today    1. Type 2 diabetes mellitus without complication, without long-term current use of insulin (HCC)  Chronic, much better controlled, A1c from 10.6 down to 7.0, he has made lifestyle changes including discontinuing sugary beverages, encouraged continued healthy lifestyle, he is on statin, he did have eye exam, we will get these records, continue the metformin and the Jardiance  -     POCT glycosylated hemoglobin (Hb A1C)  2. Other proteinuria  He is does have chronic low-level proteinuria, will again repeat at next visit, he is on losartan and also the Jardiance which should be protective, blood pressure under good control, recommended refraining from using any nephrotoxic medications, in general healthy lifestyle  -     POCT Microalbumin  3. Paroxysmal atrial fibrillation (HCC)  Chronic, regular rhythm today, he is on Xarelto, he did have a thigh hematoma after injury which is resolving  4. Primary hypertension.  Chronic, well controlled, continue current medications and treatment plan    5. Lipoma of chest wall  He does have 2 lipomas of the chest wall, he would like to have them removed, will refer to general surgery  -     [facility]  6. Chronic anticoagulation    Return in about 3 months (around 06/20/2022) for Diabetes follow up.         Subjective   SUBJECTIVE/OBJECTIVE:  HPI  He is here for follow-up.  Very excited to hear that his A1c is gone down.  No other concerns today  Has eye exam no Dm problems,     Quit drinking power aid.       Declined preventative screening identified as care gaps unless ordered through this visit    PHQ2/PHQ9  PHQ-2 Score: 0  PHQ-2 Over the past 2 weeks, how often have you been  bothered by any of the following problems?  Little interest or pleasure in doing things: Not at all  Feeling down, depressed, or hopeless: Not at all  PHQ-2 Score: 0   PHQ-9 Total Score: 0 (03/20/2022  7:50 AM)       Past Medical History:  has a past medical history of Asthma, Diabetes ([place]), HLD (hyperlipidemia), HTN (hypertension), and PAF (paroxysmal atrial fibrillation) ([place]).   Past Surgical History:  has a past surgical history that includes Cardiac catheterization (12/12/2020).  Social History:  reports that he has never smoked. He has never used smokeless tobacco. He reports current alcohol use. He reports that he does not use drugs.  Family History: family history includes Heart Attack in his father.  Allergies: Shellfish-derived products  Medications:   Current Outpatient Medications   Medication Sig Dispense Refill    metoprolol succinate (TOPROL XL) 50 MG extended release tablet Take 1 tablet by mouth in the morning and at bedtime 180 tablet 3    atorvastatin (LIPITOR) 40 MG tablet Take 1 tablet by mouth at bedtime 90 tablet 3    empagliflozin (JARDIANCE) 25 MG tablet Take 1 tablet by mouth daily 90 tablet 1    losartan (COZAAR) 50 MG tablet TAKE 1 TABLET BY MOUTH DAILY 90 tablet 3    metFORMIN (GLUCOPHAGE-XR) 500 MG extended release tablet TAKE 1 TABLET BY MOUTH DAILY  WITH BREAKFAST 30 tablet 5    XARELTO 20 MG TABS tablet TAKE 1 TABLET BY MOUTH DAILY WITH BREAKFAST 90 tablet 3    FREESTYLE LITE strip Inject 1 each into the skin 4 times daily (before meals and nightly)      FreeStyle Lancets MISC Inject 1 each into the skin 4 times daily (before meals and nightly)      glucose monitoring (FREESTYLE FREEDOM) kit 1 kit by Does not apply route daily 1 kit 0     No current facility-administered medications for this visit.      Allergies: Shellfish-derived products     Review of Systems   Constitutional:  Negative for activity change, appetite change, fatigue, fever and unexpected weight change.   HENT:   Negative for trouble swallowing.    Eyes:  Negative for visual disturbance.   Respiratory:  Negative for shortness of breath.    Cardiovascular:  Negative for chest pain.   Gastrointestinal:  Negative for abdominal pain.   Musculoskeletal:  Negative for arthralgias.   Neurological:  Negative for weakness, light-headedness and headaches.   Psychiatric/Behavioral:  Negative for confusion and sleep disturbance.    All other systems reviewed and are negative.       Objective   BP 130/86    Pulse 65    Temp 96.9 ??F (36.1 ??C) (Temporal)    Resp 20    Ht 6' 2"  (1.88 m)    Wt 285 lb (129.3 kg)    SpO2 97%    BMI 36.59 kg/m??       Lab Results   Component Value Date    LABA1C 7.0 03/20/2022    LABA1C 10.6 12/20/2021    LABA1C 8.1 07/10/2021     Lab Results   Component Value Date    CHOL 220 (H) 12/06/2020     Lab Results   Component Value Date    TRIG 334 (H) 12/06/2020     Lab Results   Component Value Date    HDL 33 12/06/2020     Lab Results   Component Value Date    LDLCALC 120 (H) 12/06/2020     Lab Results   Component Value Date    LABVLDL 67 12/06/2020     No results found for: CHOLHDLRATIO   Creatinine   Date Value Ref Range Status   12/15/2021 1.2 0.7 - 1.2 mg/dL Final   04/30/2021 1.1 0.7 - 1.2 mg/dL Final   12/12/2020 1.1 0.7 - 1.2 mg/dL Final       The 10-year ASCVD risk score (Arnett DK, et al., 2019) is: 17.3%    Values used to calculate the score:      Age: 73 years      Sex: Male      Is Non-Hispanic African American: Yes      Diabetic: Yes      Tobacco smoker: No      Systolic Blood Pressure: 829 mmHg      Is BP treated: Yes      HDL Cholesterol: 33 mg/dL      Total Cholesterol: 220 mg/dL     Physical Exam  Constitutional:       General: He is not in acute distress.     Appearance: Normal appearance.   HENT:      Head: Normocephalic and atraumatic.      Right Ear: External ear normal.      Left Ear: External ear normal.      Nose:  Nose normal.      Mouth/Throat:      Mouth: Mucous membranes are moist.   Eyes:       Extraocular Movements: Extraocular movements intact.      Conjunctiva/sclera: Conjunctivae normal.   Cardiovascular:      Rate and Rhythm: Normal rate and regular rhythm.      Heart sounds: No murmur heard.  Pulmonary:      Effort: Pulmonary effort is normal.      Breath sounds: Normal breath sounds. No wheezing.   Musculoskeletal:         General: Normal range of motion.   Neurological:      General: No focal deficit present.      Mental Status: He is alert.   Psychiatric:         Mood and Affect: Mood normal.         Behavior: Behavior normal.           An electronic signature was used to authenticate this note.    --Gillian Shields, MD       *NOTE: This report was transcribed using voice recognition software. Every effort was made to ensure accuracy; however, inadvertent computerized transcription errors may be present.

## 2022-04-04 ENCOUNTER — Ambulatory Visit
Admit: 2022-04-04 | Discharge: 2022-04-04 | Payer: PRIVATE HEALTH INSURANCE | Attending: Surgical Critical Care | Primary: Family Medicine

## 2022-04-04 DIAGNOSIS — I480 Paroxysmal atrial fibrillation: Secondary | ICD-10-CM

## 2022-04-04 NOTE — Telephone Encounter (Signed)
Prior Authorization Form:      DEMOGRAPHICS:                     Patient Name:  [facility] BLOODWORTH  Patient DOB:  1971/02/07            Insurance:  Payor: [name] / Plan: [place] (HMO) / Product Type: *No Product type* /   Insurance ID Number:    Payer/Plan Subscr DOB Sex Relation Sub. Ins. ID Effective Group Num   1. JUANDIEGO, KOLENOVIC October 22, 1971 Male Self [id number] 10/31/20 [id number]                                   [address]         DIAGNOSIS & PROCEDURE:                       Procedure/Operation: Excision fo Right Chest lipoma         CPT Code: 73419    Diagnosis:  D17.1    ICD10 Code: Lipoma    Location:  [facility], [place]    Surgeon:  Beather Arbour, MD      [facility] INFORMATION:                          Date: 05/22/2022    Time: 0730              Anesthesia:  MAC/TIVA                                                       Status:  Outpatient          Electronically signed by Delton Coombes, MA on 04/04/2022 at 2:26 PM

## 2022-04-04 NOTE — Progress Notes (Signed)
[place]    General Surgery Attending History and Physical    Patient's Name/Date of Birth: Thomas Walton / March 10, 1971 (51 y.o.)    Date: April 04, 2022     CC: Chest wall lipoma    HPI:  51 year old male who takes Eliquis for paroxysmal A-fib was referred by his primary care doctor for 2 chest wall lipomas.  Patient's medical history includes diabetes, hypertension and asthma    Patient was referred here by his primary care doctor-Dr.gerow.  Patient has had this right anterior chest wall lipoma for 2 years.  It causes occasional pain and asymptomatic.  There is no drainage.  He is here today with his fiance Lattie Haw.      Patient is due for colonoscopy.  He has never had one.  He declines at this time and wants to use a Cologuard box test    Past Medical History:   Diagnosis Date    Asthma     Diabetes ([place])     HLD (hyperlipidemia)     HTN (hypertension)     PAF (paroxysmal atrial fibrillation) (HCC)        Past Surgical History:   Procedure Laterality Date    CARDIAC CATHETERIZATION  12/12/2020       Current Outpatient Medications   Medication Sig Dispense Refill    metoprolol succinate (TOPROL XL) 50 MG extended release tablet Take 1 tablet by mouth in the morning and at bedtime 180 tablet 3    atorvastatin (LIPITOR) 40 MG tablet Take 1 tablet by mouth at bedtime 90 tablet 3    empagliflozin (JARDIANCE) 25 MG tablet Take 1 tablet by mouth daily 90 tablet 1    losartan (COZAAR) 50 MG tablet TAKE 1 TABLET BY MOUTH DAILY 90 tablet 3    metFORMIN (GLUCOPHAGE-XR) 500 MG extended release tablet TAKE 1 TABLET BY MOUTH DAILY WITH BREAKFAST 30 tablet 5    XARELTO 20 MG TABS tablet TAKE 1 TABLET BY MOUTH DAILY WITH BREAKFAST 90 tablet 3    FREESTYLE LITE strip Inject 1 each into the skin 4 times daily (before meals and nightly)      FreeStyle Lancets MISC Inject 1 each into the skin 4 times daily (before meals and nightly)      glucose monitoring (FREESTYLE FREEDOM) kit 1 kit by Does not apply route  daily 1 kit 0     No current facility-administered medications for this visit.       Allergies   Allergen Reactions    Shellfish-Derived Products Anaphylaxis     Crab, lobster, shrimp, etc.       Family History   Problem Relation Age of Onset    Heart Attack Father        Social History     Socioeconomic History    Marital status: Social worker     Spouse name: Not on file    Number of children: Not on file    Years of education: Not on file    Highest education level: Not on file   Occupational History    Not on file   Tobacco Use    Smoking status: Never    Smokeless tobacco: Never   Vaping Use    Vaping Use: Never used   Substance and Sexual Activity    Alcohol use: Yes     Comment: rare    Drug use: Never    Sexual activity: Not on file   Other Topics Concern  Not on file   Social History Narrative    Drinks 1 cup of coffee/week     Social Determinants of Health     Financial Resource Strain: Low Risk     Difficulty of Paying Living Expenses: Not hard at all   Food Insecurity: No Food Insecurity    Worried About Charity fundraiser in the Last Year: Never true    Arboriculturist in the Last Year: Never true   Transportation Needs: No Transportation Needs    Lack of Transportation (Medical): No    Lack of Transportation (Non-Medical): No   Physical Activity: Insufficiently Active    Days of Exercise per Week: 3 days    Minutes of Exercise per Session: 10 min   Stress: Stress Concern Present    Feeling of Stress : Very much   Social Connections: Moderately Integrated    Frequency of Communication with Friends and Family: More than three times a week    Frequency of Social Gatherings with Friends and Family: More than three times a week    Attends Religious Services: Never    Marine scientist or Organizations: Yes    Attends Music therapist: More than 4 times per year    Marital Status: Married   Human resources officer Violence: Not At Risk    Fear of Current or Ex-Partner: No    Emotionally Abused:  No    Physically Abused: No    Sexually Abused: No   Housing Stability: Low Risk     Unable to Pay for Housing in the Last Year: No    Number of [place] in the Last Year: 1    Unstable Housing in the Last Year: No       ROS:  Review of Systems - History obtained from the patient  General ROS: negative  Psychological ROS: negative  Ophthalmic ROS: negative  Allergy and Immunology ROS: negative  Hematological and Lymphatic ROS: Positive for Eliquis use  Endocrine ROS: Positive for diabetes  Breast ROS: negative  Respiratory ROS: negative  Cardiovascular ROS: negative  Gastrointestinal ROS:neg  Genito-Urinary ROS: negative  Musculoskeletal ROS: Positive for right chest wall lipoma      Physical Exam:  Vitals:    04/04/22 1403   BP: 138/82   Pulse: 66   Resp: 16       PSYCH: mood and affect normal, alert and oriented x 3  CONSTITUTIONAL: No apparent distress, comfortable  EYES: Sclera white, pupils equal round and reactive to light  ENMT:  Hearing normal, trachea midline, ears externally intact  LYMPH: no lympadenopathy in neck. No lympadenopathy in groins  RESP: Breath sounds were clear and equal with no rales, wheezes, or rhonchi.              Respiratory effort was normal with no retractions or use of accessory muscles.  CV: Heart sounds were normal with a regular rate and rhythm.   No pedal edema  GI/ Abdomen: The abdomen was soft and non distended.                     There was no tenderness, guarding, rebound, or rigidity.  There was no                     masses, hepatosplenomegaly, or hernias.      Rectal -deferred  MSK: no clubbing/ no cyanosis/ gait normal  Right anterior chest wall lipoma approximately  Assessment/Plan:            I have reviewed the prior progress note from the patient's primary care provider visit on March 20, 2022  .       I have reviewed the following test results::  -Hemoglobin A1c from March 21 23-7-reasonably well-controlled  -CBC with differential from December 20, 2021-white blood cell count 7.8 hemoglobin 13.5 platelets 246-within normal limits    I have personally reviewed the ultrasound of the trunk from January 13-probable lipomas 1.9 and 2.4 cm    -Symptomatic chest wall lipomas-plan on formal excision  i explained the plan to excise the area of the skin lesion.   The specimen will be sent to pathology.    I discussed the risk of  Bleeding, infection, and recurrence. The patient acknowledges these risks and wishes to proceed with the surgery.      I explained to the patient and his fiance Lattie Haw that diabetes hinders wound healing.  However his blood sugar is reasonably well controlled which is a good thing.  Eliquis use does increase the risk for bleeding.    -Type 2 diabetes-patient's hemoglobin A1c is 7-suggest reasonable well-controlled    -Paroxysmal A-fib-we will need to hold Eliquis for 72 hours prior to procedure    -Screening colonoscopy-patient declines colonoscopy at this time.  He prefers a Cologuard test      Beather Arbour, MD, FACS  04/04/2022  2:16 PM     NOTE: This report was transcribed using voice recognition software. Every effort was made to ensure accuracy; however, inadvertent computerized transcription errors may be present.

## 2022-04-04 NOTE — Telephone Encounter (Signed)
Cardiac rehab contacted. Cardiac Rehab  states that they cannot give a diagnosis code for cardiac rehab to be covered until he meets with Dr Alfred Levins on 04/23/2022 appointment.

## 2022-04-04 NOTE — Telephone Encounter (Signed)
-----   Message from Vickey Huger, MD sent at 04/02/2022  8:27 PM EDT -----  Regarding: FW: call cardiac rehad and get diagnosis codes that would be covered for his cardiac rehab    ----- Message -----  From: Vickey Huger, MD  Sent: 03/20/2022   8:07 AM EDT  To: Vickey Huger, MD  Subject: call cardiac rehad and get diagnosis

## 2022-04-04 NOTE — Patient Instructions (Signed)
If you have any questions, please call Candice at [phone].

## 2022-04-04 NOTE — Telephone Encounter (Signed)
Please call and remind him of this appointment and let him know that they will talk about cardiac rehab at that time

## 2022-04-23 ENCOUNTER — Encounter: Payer: PRIVATE HEALTH INSURANCE | Attending: Cardiovascular Disease | Primary: Family Medicine

## 2022-05-16 NOTE — Progress Notes (Signed)
[facility]   PRE-ADMISSION TESTING GENERAL INSTRUCTIONS  PAT Phone Number: [phone]      GENERAL INSTRUCTIONS:    [x]  Antibacterial Soap Shower Night before and/or AM of surgery.  []  CHG Wipes instruction sheet and wipes given.  []  Hibiclens shower the night before and the morning of surgery.   -Do not use Hibiclens on your face or head.   [x]  Do not wear lotions, powders, deodorant.   [x]  Nothing by mouth after midnight; including gum, candy, mints, or water.  [x]  You may brush your teeth, gargle, but do NOT swallow water.   [x]  No tobacco products, illegal drugs, or alcohol within 24 hours of your surgery.  [x]  Jewelry or valuables should not be brought to the hospital. All body and/or tongue piercing's must be removed prior to arriving to hospital. No contact lens or hair pins.   [x]  Arrange transportation with a responsible adult companion to and from the hospital. Arrange for someone to be with you for the remainder of the day and for 24 hours after your procedure due to having had anesthesia.          -Who will be your companion for transportation?____Lisa______         -Who will be staying with you for 24 hrs after your procedure?__________________  [x]  Bring insurance card and photo ID.  []  Bring copy of living will or healthcare power of attorney papers to be placed in your electronic record.  []  Urine Pregnancy test will be preformed the day of surgery. A specimen sample may be brought from home.  []  Transfusion ) Bracelet: Please bring with you to hospital, day of surgery.     PARKING INSTRUCTIONS:     [x]  ARRIVAL DATE & TIME: 5/23  @  0530  [x]  Times are subject to change. We will contact you the business day before surgery if that were to occur.  [x]  Enter into the . Two people may accompany you. Masks are not required.  [x]  Parking Lot "I" is where you will park. It is located on the corner of and . The entrance is  on [facility].   Only one vehicle - per patient, is permitted in parking lot.   Upon entering the parking lot, a voucher ticket will print.    EDUCATION INSTRUCTIONS:        []  Knee or Hip replacement booklet & exercise pamphlets given.     []  [facility] Pamphlet placed in chart.   []  Pre-admission Testing educational folder given  []  Incentive Spirometry,coughing & deep breathing exercises reviewed.     []  Fluoroscopy-Xray used in surgery reviewed with patient. Educational pamphlet placed in chart.  [x]  Pain: Post-op pain is normal and to be expected. You will be asked to rate your pain from 0-10.  []  Joint camp offered & Joint replacement booklets given.  []  Other:___________________________    MEDICATION INSTRUCTIONS:    [x]  Bring a complete list of your medications, please write the last time you took the medicine, give this list to the nurse in Pre-Op.  [x]  Take only the following medications the morning of surgery with 1-2 ounces of water: metoprolol  [x]  Stop all herbal supplements and vitamins 5 days before surgery. Stop NSAIDS 7 days before surgery.  [x]  DO NOT take any diabetic medicine the morning of surgery.  Follow instructions for insulin the day before surgery.  [x]  If you are diabetic and your  blood sugar is low or you feel symptomatic, you may drink 1-2 ounces of apple juice or take a glucose tablet.            -The morning of your procedure, you may call the pre-op area if you have concerns about your blood sugar [phone].  []  Use your inhalers the morning of surgery.  Bring your emergency inhaler with you day of surgery.  [x]  Follow physician instructions regarding any blood thinners you may be taking.  Last Dose Xarelto 5/19    WHAT TO EXPECT:    [x]  The day of surgery you will be greeted and checked in by the .  In addition, you will be registered in the [place] by a Patient . Please bring your photo ID and  insurance card. A nurse will greet you in accordance to the time you are needed in the pre-op area to prepare you for surgery. Please do not be discouraged if you are not greeted in the order you arrive as there are many variables that are involved in patient preparation.  Your patience is greatly appreciated as you wait for your nurse.  Please bring in items such as: books, magazines, newspapers, electronics, or any other items  to occupy your time in the waiting area.    [x]   Delays may occur with surgery and staff will make a sincere effort to keep you informed of delays.  If any delays occur with your procedure, we apologize ahead of time for your inconvenience as we recognize the value of your time.

## 2022-05-21 NOTE — Progress Notes (Signed)
Spoke with patient's wife, Lattie Haw, regarding time change for procedure tomorrow, 5/23.  Patient is to arrive at 0500, surgery is scheduled for 0700.  Lattie Haw verbalized understanding.

## 2022-05-22 ENCOUNTER — Inpatient Hospital Stay: Payer: PRIVATE HEALTH INSURANCE | Attending: Surgical Critical Care

## 2022-05-22 LAB — POCT GLUCOSE: Meter Glucose: 110 mg/dL — ABNORMAL HIGH (ref 74–99)

## 2022-05-22 MED ORDER — FENTANYL CITRATE (PF) 100 MCG/2ML IJ SOLN
100 MCG/2ML | INTRAMUSCULAR | Status: DC | PRN
Start: 2022-05-22 — End: 2022-05-22
  Administered 2022-05-22: 11:00:00 100 via INTRAVENOUS

## 2022-05-22 MED ORDER — NORMAL SALINE FLUSH 0.9 % IV SOLN
0.9 % | Freq: Two times a day (BID) | INTRAVENOUS | Status: DC
Start: 2022-05-22 — End: 2022-05-22

## 2022-05-22 MED ORDER — FENTANYL CITRATE (PF) 100 MCG/2ML IJ SOLN
100 MCG/2ML | INTRAMUSCULAR | Status: AC
Start: 2022-05-22 — End: ?

## 2022-05-22 MED ORDER — LIDOCAINE HCL 1 % IJ SOLN
1 % | INTRAMUSCULAR | Status: DC | PRN
Start: 2022-05-22 — End: 2022-05-22
  Administered 2022-05-22: 11:00:00 18 via INTRADERMAL

## 2022-05-22 MED ORDER — ONDANSETRON HCL 4 MG/2ML IJ SOLN
4 MG/2ML | INTRAMUSCULAR | Status: AC
Start: 2022-05-22 — End: ?

## 2022-05-22 MED ORDER — NORMAL SALINE FLUSH 0.9 % IV SOLN
0.9 % | INTRAVENOUS | Status: DC | PRN
Start: 2022-05-22 — End: 2022-05-22

## 2022-05-22 MED ORDER — LIDOCAINE HCL 1 % IJ SOLN
1 % | INTRAMUSCULAR | Status: AC
Start: 2022-05-22 — End: ?

## 2022-05-22 MED ORDER — PROPOFOL 500 MG/50ML IV EMUL
500 MG/50ML | INTRAVENOUS | Status: AC
Start: 2022-05-22 — End: ?

## 2022-05-22 MED ORDER — MIDAZOLAM HCL 2 MG/2ML IJ SOLN
2 MG/ML | INTRAMUSCULAR | Status: DC | PRN
Start: 2022-05-22 — End: 2022-05-22
  Administered 2022-05-22: 11:00:00 2 via INTRAVENOUS

## 2022-05-22 MED ORDER — SODIUM CHLORIDE 0.9 % IV SOLN
0.9 % | INTRAVENOUS | Status: DC | PRN
Start: 2022-05-22 — End: 2022-05-22

## 2022-05-22 MED ORDER — PROPOFOL 500 MG/50ML IV EMUL
500 MG/50ML | INTRAVENOUS | Status: DC | PRN
Start: 2022-05-22 — End: 2022-05-22
  Administered 2022-05-22: 11:00:00 100 via INTRAVENOUS

## 2022-05-22 MED ORDER — MIDAZOLAM HCL 2 MG/2ML IJ SOLN
2 MG/ML | INTRAMUSCULAR | Status: AC
Start: 2022-05-22 — End: ?

## 2022-05-22 MED ORDER — CEFAZOLIN SODIUM 1 G IJ SOLR
1 g | INTRAMUSCULAR | Status: AC
Start: 2022-05-22 — End: 2022-05-22
  Administered 2022-05-22: 11:00:00 3000 mg via INTRAVENOUS

## 2022-05-22 MED ORDER — OXYCODONE-ACETAMINOPHEN 5-325 MG PO TABS
5-325 MG | ORAL_TABLET | Freq: Four times a day (QID) | ORAL | 0 refills | Status: AC | PRN
Start: 2022-05-22 — End: 2022-05-25
  Filled 2022-05-22: qty 28, 3d supply, fill #0

## 2022-05-22 MED ORDER — SODIUM CHLORIDE 0.9 % IV SOLN
0.9 % | INTRAVENOUS | Status: DC
Start: 2022-05-22 — End: 2022-05-22
  Administered 2022-05-22: 10:00:00 via INTRAVENOUS

## 2022-05-22 MED FILL — CEFAZOLIN SODIUM 10 G IJ SOLR: 10 g | INTRAMUSCULAR | Qty: 3

## 2022-05-22 MED FILL — XYLOCAINE 1 % IJ SOLN: 1 % | INTRAMUSCULAR | Qty: 20

## 2022-05-22 MED FILL — PROPOFOL 500 MG/50ML IV EMUL: 500 MG/50ML | INTRAVENOUS | Qty: 50

## 2022-05-22 MED FILL — FENTANYL CITRATE (PF) 100 MCG/2ML IJ SOLN: 100 MCG/2ML | INTRAMUSCULAR | Qty: 2

## 2022-05-22 MED FILL — ONDANSETRON HCL 4 MG/2ML IJ SOLN: 4 MG/2ML | INTRAMUSCULAR | Qty: 2

## 2022-05-22 MED FILL — MIDAZOLAM HCL 2 MG/2ML IJ SOLN: 2 MG/ML | INTRAMUSCULAR | Qty: 2

## 2022-05-22 MED FILL — OXYCODONE-ACETAMINOPHEN 5-325 TABS: 5-325 MG | 3 days supply | Qty: 28 | Fill #0 | Status: AC

## 2022-05-22 NOTE — H&P (Signed)
GENERAL SURGERY  H&P NOTE  05/22/2022    HPI  Thomas Walton is a 51 y.o. male with history of asthma, DM, HLD, HTN, A-fib on Xarelto who presented initially secondary to right chest wall masses.  Patient states that he intermittently gets pain related to these but denies any associated skin changes or drainage.  Patient denies any prior issues with anesthesia.  Patient denies any associated overlying inflammatory changes or signs of infection.      Past Medical History:   Diagnosis Date    Asthma     Diabetes ([place])     HLD (hyperlipidemia)     HTN (hypertension)     PAF (paroxysmal atrial fibrillation) (HCC)        Past Surgical History:   Procedure Laterality Date    CARDIAC CATHETERIZATION  12/12/2020       Medications Prior to Admission:    Prior to Admission medications    Medication Sig Start Date End Date Taking? Authorizing Provider   metoprolol succinate (TOPROL XL) 50 MG extended release tablet Take 1 tablet by mouth in the morning and at bedtime 01/03/22   Gillian Shields, MD   atorvastatin (LIPITOR) 40 MG tablet Take 1 tablet by mouth at bedtime 01/03/22   Gillian Shields, MD   empagliflozin (JARDIANCE) 25 MG tablet Take 1 tablet by mouth daily 12/20/21   Gillian Shields, MD   losartan (COZAAR) 50 MG tablet TAKE 1 TABLET BY MOUTH DAILY 11/13/21   Gillian Shields, MD   metFORMIN (GLUCOPHAGE-XR) 500 MG extended release tablet TAKE 1 TABLET BY MOUTH DAILY WITH BREAKFAST 10/12/21   Lamont Dowdy, MD   XARELTO 20 MG TABS tablet TAKE 1 TABLET BY MOUTH DAILY WITH BREAKFAST 09/01/21   Gillian Shields, MD   FREESTYLE LITE strip Inject 1 each into the skin 4 times daily (before meals and nightly) 12/12/20   Historical Provider, MD   FreeStyle Lancets MISC Inject 1 each into the skin 4 times daily (before meals and nightly) 12/12/20   Historical Provider, MD   glucose monitoring (FREESTYLE FREEDOM) kit 1 kit by Does not apply route daily 12/12/20   Earma Reading, DO       Allergies   Allergen Reactions    Shellfish-Derived  Products Anaphylaxis     Crab, lobster, shrimp, etc.       Family History   Problem Relation Age of Onset    Heart Attack Father        Social History     Tobacco Use    Smoking status: Never    Smokeless tobacco: Never   Vaping Use    Vaping Use: Never used   Substance Use Topics    Alcohol use: Yes     Comment: rare    Drug use: Never         Review of Systems reviewed and otherwise negative as noted in HPI    PHYSICAL EXAM:    Vitals:    05/22/22 0517   BP: 125/77   Pulse: 65   Resp: 20   Temp: 97 F (36.1 C)   SpO2: 96%       General Appearance:  awake, alert, oriented, in no acute distress  Skin:  Skin color, texture, turgor normal. No rashes or lesions.Right anterior chest wall lipoma noted.   Head/face:  NCAT  Eyes:  No gross abnormalities.  Lungs:  Normal expansion.  Clear to auscultation.  No rales, rhonchi, or wheezing.  Heart:  Heart  regular rate and rhythm  Abdomen:  Soft, non-tender, normal bowel sounds.  No bruits, organomegaly or masses.  Extremities: Extremities warm to touch, pink, with no edema.  LABS:    CBC  No results for input(s): WBC, HGB, HCT, PLT in the last 72 hours.  BMP  No results for input(s): NA, K, CL, CO2, BUN, CREATININE, GLU, CALCIUM in the last 72 hours.  Liver Function  No results for input(s): AMYLASE, LIPASE, BILITOT, BILIDIR, AST, ALT, ALKPHOS, PROT, LABALBU in the last 72 hours.  No results for input(s): LACTATE in the last 72 hours.  No results for input(s): INR, PTT in the last 72 hours.    Invalid input(s): PT    RADIOLOGY    No results found.      ASSESSMENT:  51 y.o. male with Right chest wall lipoma    PLAN:  -Plan for right chest wall lipoma excision.  Discussed risk/benefits with patient at bedside and patient agrees to proceed  -Discussed with Dr. Ronalee Red    Electronically signed by Juluis Pitch, DO on 05/22/22 at 7:01 AM EDT

## 2022-05-22 NOTE — Anesthesia Post-Procedure Evaluation (Signed)
Department of Anesthesiology  Postprocedure Note    Patient: Thomas Walton  MRN: [id number]  Birthdate: January 10, 1971  Date of evaluation: 05/22/2022      Procedure Summary     Date: 05/22/22 Room / Location: [facility] OR 04 / [facility]    Anesthesia Start: [phone] Anesthesia Stop: 0740    Procedure: EXCISION RIGHT CHEST WALL CYST (Right: Chest) Diagnosis:       Lipoma of flank      (Lipoma of flank [D17.1])    Surgeons: Nelle Don, MD Responsible Provider: Ivette Loyal, MD    Anesthesia Type: MAC ASA Status: 3          Anesthesia Type: No value filed.    Aldrete Phase I: Aldrete Score: 8    Aldrete Phase II:        Anesthesia Post Evaluation    Patient location during evaluation: PACU  Patient participation: complete - patient participated  Level of consciousness: awake and alert  Airway patency: patent  Nausea & Vomiting: no nausea and no vomiting  Complications: no  Cardiovascular status: blood pressure returned to baseline and hemodynamically stable  Respiratory status: acceptable and spontaneous ventilation  Hydration status: euvolemic  Multimodal analgesia pain management approach

## 2022-05-22 NOTE — Progress Notes (Signed)
Admitted to Same Day Surgery. Preop instructions given to patient.

## 2022-05-22 NOTE — Op Note (Signed)
Operative Note      Patient: Thomas Walton  Date of Birth: Jun 08, 1971  MRN: [id number]    Date of Procedure: 06-03-2022    Pre-Op Diagnosis Codes:     * Lipoma of flank [D17.1]    Post-Op Diagnosis: Same       Procedure: Excision of right chest wall mass-4 x 2.5 and half centimeters  Surgeon(s):  Lorene Dy, MD    Assistant:   Resident: Ann Lions, DO    Anesthesia: Monitor Anesthesia Care    Estimated Blood Loss (mL): Minimal    Complications: None    Specimens:   ID Type Source Tests Collected by Time Destination   A : RIGHT CHEST MASS Tissue Tissue SURGICAL PATHOLOGY Lorene Dy, MD 06/03/22 0710        Implants:  * No implants in log *      Drains: * No LDAs found *    Findings: 4 cm x 2.5 cm lipoma          Detailed Description of Procedure:   Patient was brought to the operating room and positioned supine on the operating table.  SCDs were applied and powered on.  Anesthesia was administered via the anesthesia record.  Patient had 3 g Ancef administered for perioperative antibiotic coverage.  The previously marked right chest wall lipoma was identified.  The right chest was prepped and draped in the usual sterile fashion with chlorhexidine.  An appropriate amount of time of 3 minutes prior to draping after prepping allowed.  A 3 cm transverse incision was marked over the apex of the lesion.  Local anesthesia was achieved with 1% lidocaine without epinephrine.  An incision with a 15 blade was made through the skin down through the level of the dermis.  At this point, using Adson forceps for retraction and a 15 blade for sharp dissection the lipoma and capsule were sharply dissected from the surrounding tissue.  The lesion was noted to go down to the level of the pectoral fascia.  Electrocautery was then used to divide the lipoma at the stalk from the underlying fascia leaving the capsule intact.  The specimen at this point was passed to the back table with plans to be sent for permanent pathology.  At  the superior lateral aspect of the cavity there was noted a small residual amount of lipoma which was then grasped with an Allis clamp and divided from the underlying tissue with electrocautery.  This additional sample was sent with the previously removed lipoma for evaluation by pathology.  The cavity was irrigated with sterile saline.  Hemostasis was then achieved with electrocautery.  Additional 1% lidocaine without epinephrine was injected at this point.  Deep dermal layer was approximated with 3-0 Vicryl suture in a simple interrupted fashion.  The skin was then closed with a 4-0 Monocryl in a running subcuticular fashion.  The skin is then cleaned with a wet and dry lap.  Skin glue was applied over the incision.    Patient tolerated procedure well and was awakened from anesthesia without incident.  Dr. Renaldo Reel was present and scrubbed for the entirety of the procedure.    Electronically signed by Cecilie Kicks, DO on June 03, 2022 at 7:38 AM    [facility]   Attending Physician Statement:  I was present during the entire procedure and was actively supervising and directing the resident. There were no complications.    Nadean Corwin, MD, FACS  2022-06-03  1:41 PM

## 2022-05-22 NOTE — Discharge Instructions (Signed)
SURGERY DISCHARGE INSTRUCTIONS    You may be drowsy or lightheaded after receiving sedation or anesthesia.    A responsible person should be with you for the next 24 hours.    FOLLOW UP: Call office to schedule follow-up appointment in 2 weeks.    DIET: Advance your diet as tolerated. Start with light diet and progress to your normal diet as you feel like eating. If you experience nausea or repeated episodes of vomiting which persist beyond 12-24 hours, notify your doctor.    ACTIVITY: Rest today. Increase activity gradually. No driving for 1 day. No driving while on prescription pain medication. No heavy lifting for 4 weeks - nothing over 10 lbs, or heavier than a milk jug.    SHOWER/BATHING: Okay to shower in 24 hours after procedure. No tub bathing, swimming or soaking for 2 weeks.    WOUND CARE: You have Dermabond dressing (skin glue) applied to your incisions. You do not need to apply anything over them. The glue will gradually work itself off over the next few weeks. Avoid directly applying lotions, creams or oils to your incision. Keep incisions clean and dry. Always ensure you and your care giver clean hands before and after caring for the wound. You may place ice on incisions to decrease the pain and bruising.    MEDICATIONS: Take as prescribed. You may take over the counter ibuprofen or tylenol for pain as directed, limit total amount of acetaminophen (tylenol) to 3 grams per 24-hour period. Okay to resume anticoagulant medication after 24hrs. You may experience constipation while taking pain medication, you may take over the counter stool softeners (Docusate/Colace or Senokot-S) as directed.     SPECIAL INSTRUCTIONS:   Call physician if they or any other problems occur:  Call the office if you have a fever over >101F, or if your incision becomes red, tender, or drains more than a small amount of clear fluid  Fever over 101    Redness, swelling, hardness or warmth at the operative site  Unrelieved  nausea    Foul smelling or cloudy drainage at the operative site   Unrelieved pain    Blood soaked dressing (Some oozing may be normal)

## 2022-05-22 NOTE — Progress Notes (Signed)
CLINICAL PHARMACY NOTE: MEDS TO BEDS    Total # of Prescriptions Filled: 1   The following medications were delivered to the patient:  Oxycod -acet 5-325mg     Additional Documentation:

## 2022-05-22 NOTE — Progress Notes (Signed)
Discharge instructions reviewed with patient.patient verbalized understanding,no questions regarding instructions.

## 2022-05-22 NOTE — Anesthesia Pre-Procedure Evaluation (Signed)
Department of Anesthesiology  Preprocedure Note       Name:  Thomas Walton   Age:  51 y.o.  DOB:  December 24, 1971                                          MRN:  [id number]         Date:  05/22/2022      Surgeon: Juliann Mule):  Nelle Don, MD    Procedure: Procedure(s):  EXCISION RIGHT CHEST WALL CYST    Medications prior to admission:   Prior to Admission medications    Medication Sig Start Date End Date Taking? Authorizing Provider   metoprolol succinate (TOPROL XL) 50 MG extended release tablet Take 1 tablet by mouth in the morning and at bedtime 01/03/22   Gillian Shields, MD   atorvastatin (LIPITOR) 40 MG tablet Take 1 tablet by mouth at bedtime 01/03/22   Gillian Shields, MD   empagliflozin (JARDIANCE) 25 MG tablet Take 1 tablet by mouth daily 12/20/21   Gillian Shields, MD   losartan (COZAAR) 50 MG tablet TAKE 1 TABLET BY MOUTH DAILY 11/13/21   Gillian Shields, MD   metFORMIN (GLUCOPHAGE-XR) 500 MG extended release tablet TAKE 1 TABLET BY MOUTH DAILY WITH BREAKFAST 10/12/21   Lamont Dowdy, MD   XARELTO 20 MG TABS tablet TAKE 1 TABLET BY MOUTH DAILY WITH BREAKFAST 09/01/21   Gillian Shields, MD   FREESTYLE LITE strip Inject 1 each into the skin 4 times daily (before meals and nightly) 12/12/20   Historical Provider, MD   FreeStyle Lancets MISC Inject 1 each into the skin 4 times daily (before meals and nightly) 12/12/20   Historical Provider, MD   glucose monitoring (FREESTYLE FREEDOM) kit 1 kit by Does not apply route daily 12/12/20   Earma Reading, DO       Current medications:    Current Facility-Administered Medications   Medication Dose Route Frequency Provider Last Rate Last Admin   . 0.9 % sodium chloride infusion   IntraVENous Continuous Nelle Don, MD 100 mL/hr at 05/22/22 0532 New Bag at 05/22/22 0532   . sodium chloride flush 0.9 % injection 5-40 mL  5-40 mL IntraVENous 2 times per day Nelle Don, MD       . sodium chloride flush 0.9 % injection 5-40 mL  5-40 mL IntraVENous PRN Nelle Don, MD       . 0.9  % sodium chloride infusion   IntraVENous PRN Nelle Don, MD       . ceFAZolin (ANCEF) 3,000 mg in sodium chloride 0.9 % 100 mL IVPB  3,000 mg IntraVENous On Call to [place], MD           Allergies:    Allergies   Allergen Reactions   . Shellfish-Derived Products Anaphylaxis     Crab, lobster, shrimp, etc.       Problem List:    Patient Active Problem List   Diagnosis Code   . Paroxysmal atrial fibrillation (HCC) I48.0   . Chronic anticoagulation Z79.01   . Type 2 diabetes mellitus without complication, without long-term current use of insulin (HCC) E11.9   . OSA (obstructive sleep apnea) G47.33   . Cardiovascular risk factor Z91.89   . Snoring R06.83   . Primary hypertension I10   . Other proteinuria R80.8   . Asymptomatic  microscopic hematuria R31.21   . Muscle tension pain M79.10   . Chronic pain of left knee M25.562, G89.29   . Pre-syncope R55   . Lipoma of flank D17.1       Past Medical History:        Diagnosis Date   . Asthma    . Diabetes ([place])    . HLD (hyperlipidemia)    . HTN (hypertension)    . PAF (paroxysmal atrial fibrillation) ([place])        Past Surgical History:        Procedure Laterality Date   . CARDIAC CATHETERIZATION  12/12/2020       Social History:    Social History     Tobacco Use   . Smoking status: Never   . Smokeless tobacco: Never   Substance Use Topics   . Alcohol use: Yes     Comment: rare                                Counseling given: Not Answered      Vital Signs (Current):   Vitals:    05/16/22 1056 05/22/22 0517   BP:  125/77   Pulse:  65   Resp:  20   Temp:  36.1 C (97 F)   TempSrc:  Temporal   SpO2:  96%   Weight: 270 lb (122.5 kg) 270 lb (122.5 kg)   Height: 6' 4" (1.93 m) 6' 4" (1.93 m)                                              BP Readings from Last 3 Encounters:   05/22/22 125/77   04/04/22 138/82   03/20/22 130/86       NPO Status: Time of last liquid consumption: 1900                        Time of last solid consumption: 1800                        Date  of last liquid consumption: 05/21/22                        Date of last solid food consumption: 05/21/22    BMI:   Wt Readings from Last 3 Encounters:   05/22/22 270 lb (122.5 kg)   04/04/22 285 lb (129.3 kg)   03/20/22 285 lb (129.3 kg)     Body mass index is 32.87 kg/m.    CBC:   Lab Results   Component Value Date/Time    WBC 7.8 12/20/2021 12:24 PM    RBC 4.73 12/20/2021 12:24 PM    HGB 13.5 12/20/2021 12:24 PM    HCT 40.6 12/20/2021 12:24 PM    MCV 85.8 12/20/2021 12:24 PM    RDW 14.1 12/20/2021 12:24 PM    PLT 246 12/20/2021 12:24 PM       CMP:   Lab Results   Component Value Date/Time    NA 134 12/15/2021 08:34 PM    K 4.5 12/15/2021 08:34 PM    CL 100 12/15/2021 08:34 PM    CO2 25 12/15/2021 08:34 PM    BUN 21 12/15/2021 08:34 PM  CREATININE 1.2 12/15/2021 08:34 PM    GFRAA >60 04/30/2021 06:38 PM    LABGLOM >60 12/15/2021 08:34 PM    GLUCOSE 306 12/20/2021 12:24 PM    PROT 7.3 12/15/2021 08:34 PM    CALCIUM 9.7 12/15/2021 08:34 PM    BILITOT 0.3 12/15/2021 08:34 PM    ALKPHOS 97 12/15/2021 08:34 PM    AST 15 12/15/2021 08:34 PM    ALT 27 12/15/2021 08:34 PM       POC Tests: No results for input(s): POCGLU, POCNA, POCK, POCCL, POCBUN, POCHEMO, POCHCT in the last 72 hours.    Coags:   Lab Results   Component Value Date/Time    APTT 39.4 12/09/2020 07:44 PM       HCG (If Applicable): No results found for: PREGTESTUR, PREGSERUM, HCG, HCGQUANT     ABGs: No results found for: PHART, PO2ART, PCO2ART, HCO3ART, BEART, O2SATART     Type & Screen (If Applicable):  No results found for: LABABO, LABRH    Drug/Infectious Status (If Applicable):  No results found for: HIV, HEPCAB    COVID-19 Screening (If Applicable):   Lab Results   Component Value Date/Time    COVID19 Not Detected 12/05/2020 10:00 PM           Anesthesia Evaluation  Patient summary reviewed and Nursing notes reviewed no history of anesthetic complications:   Airway: Mallampati: II  TM distance: >3 FB   Neck ROM: full  Mouth opening: > = 3 FB    Dental: normal exam         Pulmonary:normal exam    (+) sleep apnea: on CPAP,  asthma:                            Cardiovascular:    (+) hypertension:, dysrhythmias: atrial fibrillation,         Rhythm: regular  Rate: normal                    Neuro/Psych:   Negative Neuro/Psych ROS              GI/Hepatic/Renal:             Endo/Other:    (+) Diabetes, .                 Abdominal: normal exam            Vascular: negative vascular ROS.         Other Findings:           Anesthesia Plan      MAC     ASA 3       Induction: intravenous.      Anesthetic plan and risks discussed with patient.    Use of blood products discussed with patient whom.                     Caryl Pina, APRN - CRNA   05/22/2022

## 2022-06-13 ENCOUNTER — Encounter

## 2022-06-13 ENCOUNTER — Ambulatory Visit
Admit: 2022-06-13 | Discharge: 2022-06-13 | Payer: PRIVATE HEALTH INSURANCE | Attending: Family Medicine | Primary: Family Medicine

## 2022-06-13 DIAGNOSIS — M79662 Pain in left lower leg: Secondary | ICD-10-CM

## 2022-06-13 MED ORDER — METFORMIN HCL ER 500 MG PO TB24
500 MG | ORAL_TABLET | Freq: Every day | ORAL | 1 refills | Status: AC
Start: 2022-06-13 — End: 2022-12-10

## 2022-06-13 MED ORDER — EMPAGLIFLOZIN 25 MG PO TABS
25 MG | ORAL_TABLET | Freq: Every day | ORAL | 1 refills | Status: DC
Start: 2022-06-13 — End: 2023-04-01

## 2022-06-13 NOTE — Progress Notes (Signed)
Thomas Walton (DOB:  10-Mar-1971) is a 51 y.o. male,Established patient, here for evaluation of the following:  No chief complaint on file.         ASSESSMENT/PLAN    Patient's vital signs are stable and in no acute distress, appropriate for outpatient treatment, patient with left calf pain, low risk for DVT, already on Xarelto, will get D-dimer, did recommend massage and stretching with Voltaren gel, also with right-sided upper back pain in the same dermatome as his removal of his lipoma, again massage stretching and Voltaren gel, does have follow-up with surgery, also come back next week for diabetes visit so we can check on the pain at that time as well, will go right over to the emergency department lab and get this D-dimer drawn, ultrasound if it comes back greater than 500    1. Pain of left calf  -     D-Dimer, Quantitative; Future  2. Acute right-sided thoracic back pain    Return for Has diabetes visit scheduled next week.       No results found for this visit on 06/13/22.      Subjective   SUBJECTIVE/OBJECTIVE:  HPI  Patient is here with left leg pain. Started 1 week ago. It started as a charlie horse. Cold toes. Pain in in center mid calf. This is the leg that had hematoma. He does not have swelling.     He is having sharp pain around int he area where he had had the cyst was removed. He does have follow up with Dr Vallery Sa on the 23rd. did not have it prior to having the lipoma removed            Allergies:   Shellfish-derived products   Past Medical History:   Diagnosis Date    Asthma     Diabetes ([place])     HLD (hyperlipidemia)     HTN (hypertension)     PAF (paroxysmal atrial fibrillation) (HCC)       Family History   Problem Relation Age of Onset    Heart Attack Father       Social History     Tobacco Use    Smoking status: Never    Smokeless tobacco: Never   Substance Use Topics    Alcohol use: Yes     Comment: rare      Current Outpatient Medications   Medication Sig Dispense Refill    metFORMIN  (GLUCOPHAGE-XR) 500 MG extended release tablet Take 1 tablet by mouth daily 90 tablet 1    empagliflozin (JARDIANCE) 25 MG tablet Take 1 tablet by mouth daily 90 tablet 1    metoprolol succinate (TOPROL XL) 50 MG extended release tablet Take 1 tablet by mouth in the morning and at bedtime 180 tablet 3    atorvastatin (LIPITOR) 40 MG tablet Take 1 tablet by mouth at bedtime 90 tablet 3    losartan (COZAAR) 50 MG tablet TAKE 1 TABLET BY MOUTH DAILY 90 tablet 3    XARELTO 20 MG TABS tablet TAKE 1 TABLET BY MOUTH DAILY WITH BREAKFAST 90 tablet 3    FREESTYLE LITE strip Inject 1 each into the skin 4 times daily (before meals and nightly)      FreeStyle Lancets MISC Inject 1 each into the skin 4 times daily (before meals and nightly)      glucose monitoring (FREESTYLE FREEDOM) kit 1 kit by Does not apply route daily 1 kit 0     No current  facility-administered medications for this visit.        Review of Systems   Constitutional:  Negative for activity change, appetite change, fatigue, fever and unexpected weight change.   HENT:  Negative for trouble swallowing.    Eyes:  Negative for visual disturbance.   Respiratory:  Negative for shortness of breath.    Cardiovascular:  Negative for chest pain.   Gastrointestinal:  Negative for abdominal pain.   Musculoskeletal:  Positive for back pain. Negative for arthralgias.        Left calf pain   Neurological:  Negative for weakness, light-headedness and headaches.   Psychiatric/Behavioral:  Negative for confusion and sleep disturbance.    All other systems reviewed and are negative.       Objective   BP 114/76   Pulse 90   Temp 97.5 F (36.4 C) (Temporal)   Resp 20   Ht 6' 4"  (1.93 m)   Wt 270 lb (122.5 kg)   SpO2 97%   BMI 32.87 kg/m       Physical Exam  Constitutional:       General: He is not in acute distress.     Appearance: Normal appearance.   HENT:      Head: Normocephalic and atraumatic.      Right Ear: External ear normal.      Left Ear: External ear normal.       Nose: Nose normal.      Mouth/Throat:      Mouth: Mucous membranes are moist.   Eyes:      Extraocular Movements: Extraocular movements intact.      Conjunctiva/sclera: Conjunctivae normal.   Cardiovascular:      Rate and Rhythm: Normal rate and regular rhythm.      Heart sounds: No murmur heard.  Pulmonary:      Effort: Pulmonary effort is normal.      Breath sounds: Normal breath sounds. No wheezing.   Musculoskeletal:         General: Normal range of motion.        Legs:    Neurological:      General: No focal deficit present.      Mental Status: He is alert.   Psychiatric:         Mood and Affect: Mood normal.         Behavior: Behavior normal.           An electronic signature was used to authenticate this note.    --Gillian Shields, MD       *NOTE: This report was transcribed using voice recognition software. Every effort was made to ensure accuracy; however, inadvertent computerized transcription errors may be present.

## 2022-06-22 ENCOUNTER — Encounter: Payer: PRIVATE HEALTH INSURANCE | Attending: Surgical Critical Care | Primary: Family Medicine

## 2022-06-22 ENCOUNTER — Encounter: Payer: PRIVATE HEALTH INSURANCE | Attending: Family Medicine | Primary: Family Medicine

## 2022-06-25 ENCOUNTER — Encounter: Payer: PRIVATE HEALTH INSURANCE | Attending: Family Medicine | Primary: Family Medicine

## 2022-06-27 ENCOUNTER — Encounter: Payer: PRIVATE HEALTH INSURANCE | Attending: Orthopaedic Surgery | Primary: Family Medicine

## 2022-07-02 NOTE — Telephone Encounter (Signed)
Pt is complaining of tightness in chest, pt pulse lying down 77. Advised girlfriend for pt to go to ed. Pt went to work today

## 2022-07-02 NOTE — Telephone Encounter (Signed)
Please reschedule pt

## 2022-07-02 NOTE — Telephone Encounter (Signed)
I agree with going to the emergency room, he missed his appointment with me last week, he needs to be rescheduled

## 2022-07-04 ENCOUNTER — Ambulatory Visit
Admit: 2022-07-04 | Discharge: 2022-07-04 | Payer: PRIVATE HEALTH INSURANCE | Attending: Orthopaedic Surgery | Primary: Family Medicine

## 2022-07-04 DIAGNOSIS — M1712 Unilateral primary osteoarthritis, left knee: Secondary | ICD-10-CM

## 2022-07-04 NOTE — Progress Notes (Signed)
Chief Complaint:   Chief Complaint   Patient presents with    Knee Pain     Left knee f/u, knee almost gave out on him and almost fell down the stairs.       [name] follows up regarding his left knee.  He recently stepped down and felt like his knee was going to give out on him and causing him to fall.  He has had some pain around the anterior medial aspect of the knee.      Allergies; medications; past medical, surgical, family, and social history; and problem list have been reviewed today and updated as indicated in this encounter seen below.    Exam: Smooth and balanced at a moderate pace.  Single-leg stance possible on each side without difficulty.  There is some tenderness palpation along the medial aspect of the left knee joint.  There is also pain with McMurray testing medial.  Collateral crucial ligaments are stable.  There is minimal gapping of the joint.  Tello femoral alignment is good.  The knee is not grossly effused today.  His calf is supple without tenderness.    Radiographs: Previous imaging from 10/15/2021 shows early hypertrophic marginal changes medial femur and tibia as well as lateral femur and tibia and the upper and lower poles of the patella on the articular side.  There is also a metallic BB in the posterior lateral aspect of the knee which has apparently been there for many years.    ASSESSMENT:    Caius was seen today for knee pain.    Diagnoses and all orders for this visit:    Primary osteoarthritis of left knee  -     MRI KNEE LEFT WO CONTRAST; Future        PLAN: Discussed the symptoms and correlated these with tensional soft tissue findings and there is suspicion of meniscal pathology in the left knee.  Further useful information particularly regarding the soft tissues can be gained with MRI scan.  Discussed this and Mr. Wilms would like to proceed with that and we will seek approval and proceed accordingly.  The only concern is a metallic BB in the posterior lateral knee  which could distort imaging somewhat and he is to mention that to the technologist prior to the scan if approved.    No follow-ups on file.       Current Outpatient Medications   Medication Sig Dispense Refill    metFORMIN (GLUCOPHAGE-XR) 500 MG extended release tablet Take 1 tablet by mouth daily 90 tablet 1    empagliflozin (JARDIANCE) 25 MG tablet Take 1 tablet by mouth daily 90 tablet 1    metoprolol succinate (TOPROL XL) 50 MG extended release tablet Take 1 tablet by mouth in the morning and at bedtime 180 tablet 3    atorvastatin (LIPITOR) 40 MG tablet Take 1 tablet by mouth at bedtime 90 tablet 3    losartan (COZAAR) 50 MG tablet TAKE 1 TABLET BY MOUTH DAILY 90 tablet 3    XARELTO 20 MG TABS tablet TAKE 1 TABLET BY MOUTH DAILY WITH BREAKFAST 90 tablet 3    FREESTYLE LITE strip Inject 1 each into the skin 4 times daily (before meals and nightly)      FreeStyle Lancets MISC Inject 1 each into the skin 4 times daily (before meals and nightly)      glucose monitoring (FREESTYLE FREEDOM) kit 1 kit by Does not apply route daily 1 kit 0  No current facility-administered medications for this visit.       Patient Active Problem List   Diagnosis    Paroxysmal atrial fibrillation (HCC)    Chronic anticoagulation    Type 2 diabetes mellitus without complication, without long-term current use of insulin (HCC)    OSA (obstructive sleep apnea)    Cardiovascular risk factor    Snoring    Primary hypertension    Other proteinuria    Asymptomatic microscopic hematuria    Muscle tension pain    Chronic pain of left knee    Pre-syncope    Lipoma of flank    Acute right-sided back pain       Past Medical History:   Diagnosis Date    Asthma     Diabetes ([place])     HLD (hyperlipidemia)     HTN (hypertension)     PAF (paroxysmal atrial fibrillation) ([place])        Past Surgical History:   Procedure Laterality Date    ABDOMEN SURGERY Right 05/22/2022    EXCISION RIGHT CHEST WALL CYST performed by Nelle Don, MD at [facility]  12/12/2020       Allergies   Allergen Reactions    Shellfish-Derived Products Anaphylaxis     Crab, lobster, shrimp, etc.       Social History     Socioeconomic History    Marital status: Social worker     Spouse name: None    Number of children: None    Years of education: None    Highest education level: None   Tobacco Use    Smoking status: Never    Smokeless tobacco: Never   Vaping Use    Vaping Use: Never used   Substance and Sexual Activity    Alcohol use: Yes     Comment: rare    Drug use: Never   Social History Narrative    Drinks 1 cup of coffee/week     Social Determinants of Company secretary Strain: Low Risk     Difficulty of Paying Living Expenses: Not hard at all   Food Insecurity: No Food Insecurity    Worried About Charity fundraiser in the Last Year: Never true    Ran Out of Food in the Last Year: Never true   Transportation Needs: Unknown    Lack of Transportation (Non-Medical): No   Physical Activity: Insufficiently Active    Days of Exercise per Week: 3 days    Minutes of Exercise per Session: 10 min   Intimate Partner Violence: Not At Risk    Fear of Current or Ex-Partner: No    Emotionally Abused: No    Physically Abused: No    Sexually Abused: No   Housing Stability: Unknown    Unstable Housing in the Last Year: No       Review of Systems  As follows except as previously noted in HPI:  Constitutional: Negative for chills, diaphoresis, fatigue, fever and unexpected weight change.   Respiratory: Negative for cough, shortness of breath and wheezing.    Cardiovascular: Negative for chest pain and palpitations.   Neurological: Negative for dizziness, syncope, cephalgia.  GI / GU: negative  Musculoskeletal: see HPI       Objective:   Physical Exam   Constitutional: Oriented to person, place, and time. and appears well-developed and well-nourished. :   Head: Normocephalic and atraumatic.   Eyes: EOM are  normal.   Neck: Neck supple.   Cardiovascular: Normal rate  and regular rhythm.    Pulmonary/Chest: Effort normal. No stridor. No respiratory distress, no wheezes.   Abdominal:  No abnormal distension.    Neurological: Alert and oriented to person, place, and time.   Skin: Skin is warm and dry.   Psychiatric: Normal mood and affect. Behavior is normal. Thought content normal.    Bentleigh Stankus Ardine Eng, DO    07/04/22  3:24 PM

## 2022-07-05 ENCOUNTER — Encounter: Payer: PRIVATE HEALTH INSURANCE | Attending: Family Medicine | Primary: Family Medicine

## 2022-07-18 ENCOUNTER — Ambulatory Visit: Payer: PRIVATE HEALTH INSURANCE | Primary: Family Medicine

## 2022-07-18 DIAGNOSIS — M1712 Unilateral primary osteoarthritis, left knee: Secondary | ICD-10-CM

## 2022-07-31 ENCOUNTER — Ambulatory Visit
Admit: 2022-07-31 | Discharge: 2022-07-31 | Payer: PRIVATE HEALTH INSURANCE | Attending: Physician Assistant | Primary: Family Medicine

## 2022-07-31 DIAGNOSIS — I4891 Unspecified atrial fibrillation: Secondary | ICD-10-CM

## 2022-07-31 NOTE — Progress Notes (Signed)
14 Day Zio XT monitor applied per Luan Moore, PA.  Serial # [id number].  Instructions given to pt & pt verbalized understanding

## 2022-07-31 NOTE — Progress Notes (Signed)
[facility]   Clinic Note     Date:08/01/22   Patient Name:Thomas Walton  Date of Birth:[birth year]  Age: 51 y.o.     Primary Care Provider: Gillian Shields, MD    Subjective     This is a very pleasant 51 year old African-American male who presents for complaint of "chest discomfort". He is a former patient of Dr. Ferdinand Lango    Since his last visit in 12/2020, he had been doing well until approximately 5-6 months ago. He notes extreme fatigue and admits to falling asleep easily when not engaged in activities. He is treated for OSA and compliant with CPAP. He has follow up with Dr. Toya Smothers for that next week.    He denies any anginal complaints, SOB at rest, orthopnea, PND, LE swelling, near/actual syncope. He notes a sensation of "tingling, like when your foot fall asleep" in the chest intermittently. It has been more frequent in the last 1-2 weeks. Symptoms last for a few minutes and are self-limited without any other associated symptoms. He is still able to perform all ADLs and work related activity without limitation, but is very tired at the end of the day. Admits to some DOE but attributes it to "being a fat, old guy now".      A focused history review includes:  Coronary angiogram 11/2020 normal   PAF on apixaban 11/2020, CHA2DS2-VASc score 2  Asthma  Obesity  Type 2 diabetes. Diagnosed at the end of 2021.  Shellfish allergy-anaphylaxis  Never smoker. Rare EtOH, no illicit drugs.  No family history of premature ASCVD      Past History    Past Medical History:         Diagnosis Date    Asthma     Diabetes ([place])     HLD (hyperlipidemia)     HTN (hypertension)     PAF (paroxysmal atrial fibrillation) (HCC)        Past Surgical History:  Past Surgical History:   Procedure Laterality Date    ABDOMEN SURGERY Right 05/22/2022    EXCISION RIGHT CHEST WALL CYST performed by Nelle Don, MD at [place]  12/12/2020       Social History:    - never  smoker  - drinks alcohol rarely     Family History:-      Problem Relation Age of Onset    Heart Attack Father          Review of Systems   Negative except as in HPI        Medications     Current Outpatient Medications   Medication Sig Dispense Refill    metFORMIN (GLUCOPHAGE-XR) 500 MG extended release tablet Take 1 tablet by mouth daily 90 tablet 1    empagliflozin (JARDIANCE) 25 MG tablet Take 1 tablet by mouth daily 90 tablet 1    metoprolol succinate (TOPROL XL) 50 MG extended release tablet Take 1 tablet by mouth in the morning and at bedtime 180 tablet 3    atorvastatin (LIPITOR) 40 MG tablet Take 1 tablet by mouth at bedtime 90 tablet 3    losartan (COZAAR) 50 MG tablet TAKE 1 TABLET BY MOUTH DAILY 90 tablet 3    XARELTO 20 MG TABS tablet TAKE 1 TABLET BY MOUTH DAILY WITH BREAKFAST 90 tablet 3    FREESTYLE LITE strip Inject 1 each into the skin 4 times daily (before meals and nightly)  FreeStyle Lancets MISC Inject 1 each into the skin 4 times daily (before meals and nightly)      glucose monitoring (FREESTYLE FREEDOM) kit 1 kit by Does not apply route daily 1 kit 0     No current facility-administered medications for this visit.           Physical Examination      BP (!) 140/90 (Site: Right Upper Arm, Position: Sitting, Cuff Size: Medium Adult)   Pulse 68   Temp 99.1 F (37.3 C) (Oral)   Ht 6' 3.5" (1.918 m)   Wt 293 lb 6.4 oz (133.1 kg)   BMI 36.19 kg/m   Body surface area is 2.66 meters squared.     General: No acute distress, appears as stated age, nonicteric  Head: Atraumatic, no gross abnormalities or bruises  Neck: Supple and nontender, no carotid bruits, normal JVP  Lungs: Clear to auscultation bilaterally, no wheezes, rales, or rhonchi  Heart: Regular rate and rhythm, no murmurs, rubs, or gallops  Abdomen: Soft, nontender, nondistended, normal bowel sounds  Extremities: No obvious deformities, no cyanosis, no edema  Neurological: Alert and oriented x3, EOMI, moving all extremities  x4  Psychological: Normal mood and affect, cooperative  Skin: Color, texture, and turgor normal for age          Labs/Imaging/Diagnostics   Personally reviewed:    Lab Results   Component Value Date/Time    NA 134 12/15/2021 08:34 PM    K 4.5 12/15/2021 08:34 PM    CL 100 12/15/2021 08:34 PM    CO2 25 12/15/2021 08:34 PM    BUN 21 12/15/2021 08:34 PM    CREATININE 1.2 12/15/2021 08:34 PM    GLUCOSE 306 12/20/2021 12:24 PM    CALCIUM 9.7 12/15/2021 08:34 PM        CrCl cannot be calculated (Patient's most recent lab result is older than the maximum 180 days allowed.).    Lab Results   Component Value Date    WBC 7.8 12/20/2021    HGB 13.5 12/20/2021    HCT 40.6 12/20/2021    MCV 85.8 12/20/2021    PLT 246 12/20/2021       Lab Results   Component Value Date    ALT 27 12/15/2021    AST 15 12/15/2021    ALKPHOS 97 12/15/2021    BILITOT 0.3 12/15/2021       Lab Results   Component Value Date    LABALBU 4.4 12/15/2021       Lab Results   Component Value Date    CHOL 220 (H) 12/06/2020     Lab Results   Component Value Date    TRIG 334 (H) 12/06/2020     Lab Results   Component Value Date    HDL 33 12/06/2020     Lab Results   Component Value Date    LDLCALC 120 (H) 12/06/2020     Lab Results   Component Value Date    LABVLDL 67 12/06/2020         Lab Results   Component Value Date    LABA1C 7.0 03/20/2022         Personally interpreted the following studies.:  EKG: NSR, LAFB, inferolateral ST-T abnormality; no significant change from previous     Assessment and Plan:        Pleasant 51 year old African-American male with a history noted above. Complaints of tingling sensation in his chest - possibly palpitations. Cardiac event monitor suggested at  last office visit was does not appear it was ever completed. Will place Zio XT monitor today. BP is mildly elevated; he will check BP periodically over the next several days and call with trend. Further recommendations pending home BP readings.      Diagnosis Orders   1. Atrial  fibrillation, unspecified type (HCC)  EKG 12 lead    Basic Metabolic Panel    CBC    TSH    perflutren lipid microspheres (DEFINITY) injection 1.5 mL    ECHO COMPLETE    Cardiac event monitor      2. Primary hypertension  Basic Metabolic Panel    CBC    TSH    perflutren lipid microspheres (DEFINITY) injection 1.5 mL    ECHO COMPLETE      3. Palpitations  Cardiac event monitor           Zio XT 14 day monitor  Update TTE given fatigue, palpitations   Follow up with Dr. Toya Smothers as scheduled  Obtain CBC, BMP, TSH    Follow up with Dr. Anell Barr pending above results

## 2022-07-31 NOTE — Patient Instructions (Addendum)
Have your blood work drawn at your earliest convenience     We will schedule your echocardiogram - Thomas Walton will call with results    Check your BP readings over the next couple of weeks - further recommendations pending results    Follow up with Dr. Lamarr Lulas as scheduled

## 2022-08-09 ENCOUNTER — Institutional Professional Consult (permissible substitution)
Admit: 2022-08-09 | Discharge: 2022-08-09 | Payer: PRIVATE HEALTH INSURANCE | Attending: Critical Care Medicine | Primary: Family Medicine

## 2022-08-09 DIAGNOSIS — G4733 Obstructive sleep apnea (adult) (pediatric): Secondary | ICD-10-CM

## 2022-08-09 NOTE — Progress Notes (Signed)
Thomas Walton   08/09/22      HISTORY OF PRESENT ILLNESS:  Thomas Walton is a 51 y.o. who is being seen in consultation for sleep apnea. Notes from previous office visits including the sleep study were reviewed. He was felt to have a problem by his  fiance and cardiologist . The patient does have loud snoring. Patient does not have a short sleep latency. He does have increased urination at night. The patient complains of daytime sleepiness, denies morning headaches, denies nocturnal diaphoresis, complains of snorting or gasping at night, is not refreshed in the morning. Problems with memory/concentration is  present.  Other people have noted apnea. There is not a family history of sleep apnea. There is not a history of alcohol or recreational drug use.  There is not a history of taking prescription pain medications. This has been going on for years. Normal bedtime is 9:00 PM. Normal rise time is 5:00 AM. Neck circumference (Inches): 18 (Patient reported).  The patient does not need an alarm to wake in the morning.    Sleep Medicine 08/09/2022 07/25/2021   Sitting and reading 2 2   Watching TV 1 3   Sitting, inactive in a public place (e.g. a theatre or a meeting) 3 0   As a passenger in a car for an hour without a break 1 3   Lying down to rest in the afternoon when circumstances permit 2 3   Sitting and talking to someone 0 0   Sitting quietly after a lunch without alcohol 0 1   In a car, while stopped for a few minutes in traffic 0 0   Epworth Sleepiness Score 9 12   Neck circumference (Inches) 18 19        ALLERGIES  Allergies   Allergen Reactions    Shellfish-Derived Products Anaphylaxis     Crab, lobster, shrimp, etc.       SOCIAL HISTORY  He  reports that he has never smoked. He has never used smokeless tobacco. He reports current alcohol use. He reports that he does not use drugs.    PAST MEDICAL HISTORY  He  has a past medical history of Asthma, Diabetes (HCC), HLD (hyperlipidemia), HTN (hypertension), and  PAF (paroxysmal atrial fibrillation) (HCC).      MEDICATIONS    The patient has a current medication list which includes the following prescription(s): metformin, empagliflozin, metoprolol succinate, atorvastatin, losartan, xarelto, freestyle lite, freestyle lancets, and glucose monitoring.    IMMUNIZATIONS  Immunizations are up to date    PAST SURGICAL HISTORY  He  has a past surgical history that includes Cardiac catheterization (12/12/2020) and Abdomen surgery (Right, 05/22/2022).    FAMILY HISTORY  He family history includes Heart Attack in his father.    REVIEW OF SYSTEMS  The patient has no problems with hearing. There is no problem with seeing, smelling, or tasting. There is no history of seizure, syncope, or stroke. There is no history of hemoptysis, hematemesis, hematochezia, hematuria, or dysuria. Other than as noted above, the review of systems is unremarkable. There is no history of dyspnea, cough or wheeze.     OBJECTIVE:  On physical examination, the patient is awake and alert and in no acute distress. Ht 6\' 3"  (1.905 m)   Wt 280 lb (127 kg)   BMI 35.00 kg/m  Body mass index is 35 kg/m. As this is a virtual encounter, the remainder of the physical examination will be deferred.  PSG:  Polysomnography shows the patient to have severe OSA.      COMPLIANCE REPORT:  Good compliance with usage 25/31 nights, 6.3 hours nightly.  AHI-1.4.      DIAGNOSIS:   Diagnosis Orders   1. OSA on CPAP        2. Primary hypertension        3. Paroxysmal atrial fibrillation (HCC)        4. Type 2 diabetes mellitus without complication, without long-term current use of insulin (HCC)        5. Obesity with sleep apnea          DISCUSSION:  I had a nice discussion with the patient today concerning sleep apnea syndrome. We discussed the history of the disease, treatment options, and ramifications for not using the equipment. We also discussed the fact that the equipment needed to be used at least four hours nightly, at  least 70% of the time.  Weight loss was encouraged    Next, we reviewed the polysomnogram.     Since using the CPAP machine, which he does for about 6 hours nightly the snoring, sleepiness,  frequent nocturnal urination, snorting or gasping at night, feeling refreshed in the morning or other people noting apnea has improved.    Thomas Walton feels much better and is definitely benefiting from use of the CPAP.  He had a question as to whether or not his DME company will resume sending supplies.  I let him know that I believe it is because they were waiting for a note from me.  We will send an order to them to send more supplies now that he has been seen.  He also question as to whether or not we need to change the pressure on his machine.  With an AHI of 1.4, I believe everything is working great and no changes are necessary at this time.    All questions were answered.     PLAN:     New supplies and mask for his CPAP  Weight loss encouraged  Return to clinic in 6 months  No change in CPAP settings      Dara Lords M.D., [facility]  Diplomat in Sleep Medicine, [employer]      Ruthine Dose, was evaluated through a synchronous ( real time ) audio-video encounter. The patient (or guardian if applicable) is aware that is billable service, which includes applicable co-pays. The Virtual Visit was conducted with patient's ( and/or legal guardian's ) consent.  Patient identification was verified, and a caregiver was present when appropriate. The patient was located in a state where the provider was licensed to provide care.

## 2022-08-20 ENCOUNTER — Encounter

## 2022-08-20 ENCOUNTER — Ambulatory Visit
Admit: 2022-08-20 | Discharge: 2022-08-20 | Payer: PRIVATE HEALTH INSURANCE | Attending: Orthopaedic Surgery | Primary: Family Medicine

## 2022-08-20 DIAGNOSIS — M1712 Unilateral primary osteoarthritis, left knee: Secondary | ICD-10-CM

## 2022-08-20 NOTE — Progress Notes (Signed)
Chief Complaint:   Chief Complaint   Patient presents with    Knee Pain     Left knee MRI results. Doing okay today.       Thomas Walton is up to review his MRI of his left knee.  He is continue to have similar symptoms to when he was here before.  No history of recent physical therapy or other treatments to the knee.  Thomas Walton was present during the visit today and participated in discussion.      Allergies; medications; past medical, surgical, family, and social history; and problem list have been reviewed today and updated as indicated in this encounter seen below.    Exam: There is some tenderness palpation along the medial aspect of the left knee joint.  There is also pain with McMurray testing medial.  Collateral crucial ligaments are stable.  There is minimal gapping of the joint.  Tello femoral alignment is good.  The knee is not grossly effused today.  His calf is supple without tenderness.       Radiographs: MRI imaging of the left knee shows a significant amount of distortion of the images as a result of the metallic foreign body in the posterior aspect of his knee.  The cruciates appear to be intact.  There is chondral damage and degenerative changes obvious in the patellofemoral and medial joint compartments and slight in the lateral.  The areas of the meniscus cartilages visualized appear to be intact though as mentioned there is some distortion particularly of the posterior aspect with less than ideal visualization.    ASSESSMENT:    There are no diagnoses linked to this encounter.    PLAN: We discussed treatment options and I would prefer physical therapy at this time.  Thomas Walton was agreeable and we will schedule that and follow-up in about 3 weeks.    Return in about 3 weeks (around 09/10/2022).       Current Outpatient Medications   Medication Sig Dispense Refill    metFORMIN (GLUCOPHAGE-XR) 500 MG extended release tablet Take 1 tablet by mouth daily 90 tablet 1    empagliflozin (JARDIANCE)  25 MG tablet Take 1 tablet by mouth daily 90 tablet 1    metoprolol succinate (TOPROL XL) 50 MG extended release tablet Take 1 tablet by mouth in the morning and at bedtime 180 tablet 3    atorvastatin (LIPITOR) 40 MG tablet Take 1 tablet by mouth at bedtime 90 tablet 3    losartan (COZAAR) 50 MG tablet TAKE 1 TABLET BY MOUTH DAILY 90 tablet 3    XARELTO 20 MG TABS tablet TAKE 1 TABLET BY MOUTH DAILY WITH BREAKFAST 90 tablet 3    FREESTYLE LITE strip Inject 1 each into the skin 4 times daily (before meals and nightly)      FreeStyle Lancets MISC Inject 1 each into the skin 4 times daily (before meals and nightly)      glucose monitoring (FREESTYLE FREEDOM) kit 1 kit by Does not apply route daily 1 kit 0     No current facility-administered medications for this visit.       Patient Active Problem List   Diagnosis    Paroxysmal atrial fibrillation (HCC)    Chronic anticoagulation    Type 2 diabetes mellitus without complication, without long-term current use of insulin (HCC)    OSA (obstructive sleep apnea)    Cardiovascular risk factor    Snoring    Primary hypertension  Other proteinuria    Asymptomatic microscopic hematuria    Muscle tension pain    Chronic pain of left knee    Pre-syncope    Lipoma of flank    Acute right-sided back pain       Past Medical History:   Diagnosis Date    Asthma     Diabetes ([place])     HLD (hyperlipidemia)     HTN (hypertension)     PAF (paroxysmal atrial fibrillation) ([place])        Past Surgical History:   Procedure Laterality Date    ABDOMEN SURGERY Right 05/22/2022    EXCISION RIGHT CHEST WALL CYST performed by Nelle Don, MD at [facility]  12/12/2020       Allergies   Allergen Reactions    Shellfish-Derived Products Anaphylaxis     Crab, lobster, shrimp, etc.       Social History     Socioeconomic History    Marital status: Social worker     Spouse name: None    Number of children: None    Years of education: None    Highest education level: None    Tobacco Use    Smoking status: Never    Smokeless tobacco: Never   Vaping Use    Vaping Use: Never used   Substance and Sexual Activity    Alcohol use: Yes     Comment: rare    Drug use: Never   Social History Narrative    Drinks 1 cup of coffee/week     Social Determinants of Company secretary Strain: Low Risk     Difficulty of Paying Living Expenses: Not hard at all   Food Insecurity: No Food Insecurity    Worried About Charity fundraiser in the Last Year: Never true    Arboriculturist in the Last Year: Never true   Transportation Needs: Unknown    Lack of Transportation (Non-Medical): No   Housing Stability: Unknown    Unstable Housing in the Last Year: No       Review of Systems  As follows except as previously noted in HPI:  Constitutional: Negative for chills, diaphoresis, fatigue, fever and unexpected weight change.   Respiratory: Negative for cough, shortness of breath and wheezing.    Cardiovascular: Negative for chest pain and palpitations.   Neurological: Negative for dizziness, syncope, cephalgia.  GI / GU: negative  Musculoskeletal: see HPI       Objective:   Physical Exam   Constitutional: Oriented to person, place, and time. and appears well-developed and well-nourished. :   Head: Normocephalic and atraumatic.   Eyes: EOM are normal.   Neck: Neck supple.   Cardiovascular: Normal rate and regular rhythm.    Pulmonary/Chest: Effort normal. No stridor. No respiratory distress, no wheezes.   Abdominal:  No abnormal distension.    Neurological: Alert and oriented to person, place, and time.   Skin: Skin is warm and dry.   Psychiatric: Normal mood and affect. Behavior is normal. Thought content normal.    Jovi Zavadil Ardine Eng, DO    08/20/22  3:42 PM

## 2022-08-30 ENCOUNTER — Ambulatory Visit: Admit: 2022-08-30 | Discharge: 2022-09-04 | Payer: PRIVATE HEALTH INSURANCE | Primary: Family Medicine

## 2022-08-30 ENCOUNTER — Ambulatory Visit
Admit: 2022-08-30 | Discharge: 2022-08-30 | Payer: PRIVATE HEALTH INSURANCE | Attending: Family Medicine | Primary: Family Medicine

## 2022-08-30 DIAGNOSIS — E119 Type 2 diabetes mellitus without complications: Secondary | ICD-10-CM

## 2022-08-30 DIAGNOSIS — M25522 Pain in left elbow: Secondary | ICD-10-CM

## 2022-08-30 LAB — POCT GLYCOSYLATED HEMOGLOBIN (HGB A1C): Hemoglobin A1C: 7 %

## 2022-08-30 MED ORDER — PREDNISONE 20 MG PO TABS
20 MG | ORAL_TABLET | Freq: Every day | ORAL | 0 refills | Status: AC
Start: 2022-08-30 — End: 2022-09-04

## 2022-08-30 NOTE — Progress Notes (Signed)
Thomas Walton (DOB:  Jul 03, 1971) is a 51 y.o. male, established patient follow up , here for evaluation of the following:  Elbow Pain (Left elbow pain /)         ASSESSMENT/PLAN      1. Type 2 diabetes mellitus without complication, without long-term current use of insulin (HCC)  Chronic, now much better controlled, A1c has been 7 for 2 checks in a row now.  We can extend his next visit out 6 months.  He does know what lifestyle changes he has made that is helped with reducing the A1c from 10.6.  He feels comfortable that he will be able to continue with these lifestyle changes over the next 6 months, did recommend podiatry visit as well as eye exam  -     POCT glycosylated hemoglobin (Hb A1C)  2. Left elbow pain  Acute, he does have significant swelling, cannot straighten his elbow, he was seen by Ortho during this visit, x-ray was obtained, x-ray reviewed by Ortho without any acute abnormalities, will do oral prednisone, if it does not resolve he can see Ortho in a week or 2, he is on Xarelto there is a possibility that this could be acute effusion due to bleeding, he also fell down steps and has had significant bleeding in his thigh in the past, if he does continue to have these episodes he may need to consider whether or not he should be on an anticoagulant, for now will work with Ortho to make sure this elbow does resolve  -     XR ELBOW LEFT (MIN 3 VIEWS); Future  3. Paroxysmal atrial fibrillation (HCC)  Chronic, paroxysmal, did wear a heart rate monitor which did not show any episodes, he does continue on the Xarelto, recommended when he follows back up with cardiology/electrophysiologist he asked if there is any way he could have an ablation and get off of the anticoagulant is not his had 2 bleeding episodes as well as he did also has microscopic hematuria, he notes he will talk to them about this  4. Primary hypertension  Chronic, well controlled, continue current medications and treatment plan      Return in about 6 months (around 02/28/2023) for Diabetes follow up.         Subjective   SUBJECTIVE/OBJECTIVE:  HPI  He is doing better but has a very swollen left elbow. He does not remember any injury. He was cleaning and thinks it may abe exacerbated.     Declined preventative screening identified as care gaps unless ordered through this visit    PHQ2/PHQ9      No data recorded     Past Medical History:  has a past medical history of Asthma, Diabetes ([place]), HLD (hyperlipidemia), HTN (hypertension), and PAF (paroxysmal atrial fibrillation) ([place]).   Past Surgical History:  has a past surgical history that includes Cardiac catheterization (12/12/2020) and Abdomen surgery (Right, 05/22/2022).  Social History:  reports that he has never smoked. He has never used smokeless tobacco. He reports current alcohol use. He reports that he does not use drugs.  Family History: family history includes Heart Attack in his father.  Allergies: Shellfish-derived products  Medications:   Current Outpatient Medications   Medication Sig Dispense Refill    predniSONE (DELTASONE) 20 MG tablet Take 2 tablets by mouth daily for 5 days 10 tablet 0    metFORMIN (GLUCOPHAGE-XR) 500 MG extended release tablet Take 1 tablet by mouth daily 90 tablet 1  empagliflozin (JARDIANCE) 25 MG tablet Take 1 tablet by mouth daily 90 tablet 1    metoprolol succinate (TOPROL XL) 50 MG extended release tablet Take 1 tablet by mouth in the morning and at bedtime 180 tablet 3    atorvastatin (LIPITOR) 40 MG tablet Take 1 tablet by mouth at bedtime 90 tablet 3    losartan (COZAAR) 50 MG tablet TAKE 1 TABLET BY MOUTH DAILY 90 tablet 3    XARELTO 20 MG TABS tablet TAKE 1 TABLET BY MOUTH DAILY WITH BREAKFAST 90 tablet 3    FREESTYLE LITE strip Inject 1 each into the skin 4 times daily (before meals and nightly)      FreeStyle Lancets MISC Inject 1 each into the skin 4 times daily (before meals and nightly)      glucose monitoring (FREESTYLE FREEDOM) kit 1 kit  by Does not apply route daily 1 kit 0     No current facility-administered medications for this visit.      Allergies: Shellfish-derived products     Review of Systems   Constitutional:  Negative for activity change, appetite change, fatigue, fever and unexpected weight change.   HENT:  Negative for trouble swallowing.    Eyes:  Negative for visual disturbance.   Respiratory:  Negative for shortness of breath.    Cardiovascular:  Negative for chest pain.   Gastrointestinal:  Negative for abdominal pain.   Musculoskeletal:  Positive for arthralgias.   Neurological:  Negative for weakness, light-headedness and headaches.   Psychiatric/Behavioral:  Negative for confusion and sleep disturbance.    All other systems reviewed and are negative.       Objective   BP 127/82   Pulse 58   Temp 97.2 F (36.2 C) (Temporal)   Resp 20   Ht 6' 3"  (1.905 m)   Wt 267 lb (121.1 kg)   SpO2 99%   BMI 33.37 kg/m       Lab Results   Component Value Date    LABA1C 7.0 08/30/2022    LABA1C 7.0 03/20/2022    LABA1C 10.6 12/20/2021     Lab Results   Component Value Date    CHOL 220 (H) 12/06/2020     Lab Results   Component Value Date    TRIG 334 (H) 12/06/2020     Lab Results   Component Value Date    HDL 33 12/06/2020     Lab Results   Component Value Date    LDLCALC 120 (H) 12/06/2020     Lab Results   Component Value Date    LABVLDL 67 12/06/2020     No results found for: CHOLHDLRATIO   Creatinine   Date Value Ref Range Status   12/15/2021 1.2 0.7 - 1.2 mg/dL Final   04/30/2021 1.1 0.7 - 1.2 mg/dL Final   12/12/2020 1.1 0.7 - 1.2 mg/dL Final       The 10-year ASCVD risk score (Arnett DK, et al., 2019) is: 17.4%    Values used to calculate the score:      Age: 51 years      Sex: Male      Is Non-Hispanic African American: Yes      Diabetic: Yes      Tobacco smoker: No      Systolic Blood Pressure: 272 mmHg      Is BP treated: Yes      HDL Cholesterol: 33 mg/dL      Total Cholesterol: 220 mg/dL  Physical Exam  Constitutional:        General: He is not in acute distress.     Appearance: Normal appearance.   HENT:      Head: Normocephalic and atraumatic.      Right Ear: External ear normal.      Left Ear: External ear normal.      Nose: Nose normal.      Mouth/Throat:      Mouth: Mucous membranes are moist.   Eyes:      Extraocular Movements: Extraocular movements intact.      Conjunctiva/sclera: Conjunctivae normal.   Cardiovascular:      Rate and Rhythm: Normal rate and regular rhythm.      Heart sounds: No murmur heard.  Pulmonary:      Effort: Pulmonary effort is normal.      Breath sounds: Normal breath sounds. No wheezing.   Musculoskeletal:         General: Normal range of motion.   Skin:     Comments: Patient is unable to straighten the left elbow, there is significant swelling of the joint and also the musculature around the joint, tenderness, neurovascularly intact hand, no numbness or tingling going down his arm   Neurological:      General: No focal deficit present.      Mental Status: He is alert.   Psychiatric:         Mood and Affect: Mood normal.         Behavior: Behavior normal.           An electronic signature was used to authenticate this note.    --Gillian Shields, MD       *NOTE: This report was transcribed using voice recognition software. Every effort was made to ensure accuracy; however, inadvertent computerized transcription errors may be present.

## 2022-09-07 ENCOUNTER — Ambulatory Visit: Admit: 2022-09-07 | Payer: PRIVATE HEALTH INSURANCE | Attending: Family Medicine | Primary: Family Medicine

## 2022-09-07 DIAGNOSIS — M25522 Pain in left elbow: Secondary | ICD-10-CM

## 2022-09-07 NOTE — Progress Notes (Signed)
Did not need a visit wihth PCP needed visit with surgery

## 2022-09-17 ENCOUNTER — Encounter: Payer: PRIVATE HEALTH INSURANCE | Attending: Orthopaedic Surgery | Primary: Family Medicine

## 2022-09-20 ENCOUNTER — Ambulatory Visit
Admit: 2022-09-20 | Discharge: 2022-09-20 | Payer: PRIVATE HEALTH INSURANCE | Attending: Student in an Organized Health Care Education/Training Program | Primary: Family Medicine

## 2022-09-20 DIAGNOSIS — G8929 Other chronic pain: Secondary | ICD-10-CM

## 2022-09-20 MED ORDER — BUPIVACAINE HCL (PF) 0.25 % IJ SOLN
0.25 % | Freq: Once | INTRAMUSCULAR | Status: AC
Start: 2022-09-20 — End: 2022-09-20
  Administered 2022-09-20: 13:00:00 5 mL via INTRA_ARTICULAR

## 2022-09-20 MED ORDER — TRIAMCINOLONE ACETONIDE 40 MG/ML IJ SUSP
40 MG/ML | Freq: Once | INTRAMUSCULAR | Status: AC
Start: 2022-09-20 — End: 2022-09-20
  Administered 2022-09-20: 13:00:00 40 mg via INTRA_ARTICULAR

## 2022-09-20 NOTE — Progress Notes (Signed)
New Elbow Patient Visit     Referring Provider:   No referring provider defined for this encounter.    CHIEF COMPLAINT:   Chief Complaint   Patient presents with    Elbow Pain     Referred by Dr Venita Sheffield for left elbow pain x 1 month.  No recall injury. Popping and decrease ROM.  X-rays done        HPI:      Thomas Walton is a 51 y.o. year old male who presents today with left elbow pain.  He was seen by his primary care physician about a month ago and started on oral steroids which did not help.  He woke up 1 morning with elbow pain and stiffness.  He had no injury.  He does take Eliquis.  X-rays were reviewed do demonstrate some arthritis.  His biggest complaint is pain at extreme ranges of motion.  Denies weakness.  Denies fever chills.  Denies numbness tingling.  He has never had any problem with his elbow in the past.      PAST MEDICAL HISTORY  Past Medical History:   Diagnosis Date    Asthma     Diabetes ([place])     HLD (hyperlipidemia)     HTN (hypertension)     PAF (paroxysmal atrial fibrillation) ([place])        PAST SURGICAL HISTORY  Past Surgical History:   Procedure Laterality Date    ABDOMEN SURGERY Right 05/22/2022    EXCISION RIGHT CHEST WALL CYST performed by Nelle Don, MD at [facility]  12/12/2020       FAMILY HISTORY   Family History   Problem Relation Age of Onset    Heart Attack Father        SOCIAL HISTORY  Social History     Occupational History    Not on file   Tobacco Use    Smoking status: Never    Smokeless tobacco: Never   Vaping Use    Vaping Use: Never used   Substance and Sexual Activity    Alcohol use: Yes     Comment: rare    Drug use: Never    Sexual activity: Not on file       CURRENT MEDICATIONS     Current Outpatient Medications:     metFORMIN (GLUCOPHAGE-XR) 500 MG extended release tablet, Take 1 tablet by mouth daily, Disp: 90 tablet, Rfl: 1    empagliflozin (JARDIANCE) 25 MG tablet, Take 1 tablet by mouth daily, Disp: 90 tablet, Rfl: 1    metoprolol  succinate (TOPROL XL) 50 MG extended release tablet, Take 1 tablet by mouth in the morning and at bedtime, Disp: 180 tablet, Rfl: 3    atorvastatin (LIPITOR) 40 MG tablet, Take 1 tablet by mouth at bedtime, Disp: 90 tablet, Rfl: 3    losartan (COZAAR) 50 MG tablet, TAKE 1 TABLET BY MOUTH DAILY, Disp: 90 tablet, Rfl: 3    XARELTO 20 MG TABS tablet, TAKE 1 TABLET BY MOUTH DAILY WITH BREAKFAST, Disp: 90 tablet, Rfl: 3    FREESTYLE LITE strip, Inject 1 each into the skin 4 times daily (before meals and nightly), Disp: , Rfl:     FreeStyle Lancets MISC, Inject 1 each into the skin 4 times daily (before meals and nightly), Disp: , Rfl:     glucose monitoring (FREESTYLE FREEDOM) kit, 1 kit by Does not apply route daily, Disp: 1 kit, Rfl: 0    ALLERGIES  Allergies   Allergen Reactions    Shellfish-Derived Products Anaphylaxis     Crab, lobster, shrimp, etc.       Controlled Substances Monitoring:        REVIEW OF SYSTEMS:     Constitutional:  Negative for weight loss, fevers, chills, fatigue  Cardiovascular: Negative for chest pain, palpitations  Pulmonary: Negative for shortness of breath, labored breathing, cough  GI: negative for abdominal pain, nausea, vomitting   MSK: per HPI  Skin: negative for rash, open wounds    All other systems reviewed and are negative           PHYSICAL EXAM     Vitals:    09/20/22 0818   Weight: 270 lb (122.5 kg)   Height: 6' 3"  (1.905 m)         Height: 6' 3"  (1.905 m)  Weight: @PATIENTWT @  BMI:  Body mass index is 33.75 kg/m.    General: The patient is alert and oriented x 3, appears to be stated age and in no distress.   HEENT: head is normocephalic, atraumatic.  EOMI.   Neck: supple, trachea midline, no thyromegaly   Cardiovascular: peripheral pulses palpable.  Normal Capillary refill   Respiratory: breathing unlabored, chest expansion symmetric   Skin: no rash, no open wounds, no erythema  Psych: normal affect; mood stable  Neurologic: gait normal, sensation grossly intact in  extremities  MSK:    Shoulder:  Ipsilateral shoulder has normal range of motion, with no deformity.  Normal musculature without atrophy    Elbow Exam:   Left elbow is atraumatic.  He has decreased range of motion compared to his contralateral elbow.  He lacks about 5 degrees of full extension and about 5 degrees of full flexion.  He has full pronation and supination without pain.  He is tender over his anterolateral soft spot at the joint.  He is also tender medially.  He has no pain with wrist and wrist flexion or extension.  The elbow stable to varus valgus stress testing.  He has 5 out of 5 strength with resisted elbow flexion extension.  No signs of instability.  Sensations intact distally radial ulnar median nerve distribution      IMAGING:     XRAY: 3 views of the left elbow independently interpreted myself discussed with the patient.  He does have some degenerative changes of the elbow joint and obvious effusion noted.  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  Given the spontaneous nature of this pain along with the infusion on x-ray I have recommended an attempted aspiration of his elbow joint.  If there is blood in there then we can inject some local to help with symptomatic relief.  If this fails to provide relief my next option would be an MRI to further assess his elbow.  He is agreeable to this plan.  If this helps he can follow-up as needed.  If this does not help we can give [place] a call and we can order an MRI.    Left elbow injection and aspiration  Left elbow was identified as a site of injection.  Risk-benefit and alternatives of aspiration and injection were discussed in detail with the patient.  Verbal consent was obtained.  From a lateral approach the soft spot was identified.  The skin was prepped with Betadine.  22-gauge needle was inserted into the elbow joint and 1  cc of synovial fluid was aspirated from the elbow.  Through the same needle 40 mg of Kenalog and 2 mL of 0.25% Marcaine were injected into the elbow joint.  A sterile dressing was applied.  He tolerated procedure well.        Bernita Buffy, DO  Orthopaedic Surgery   09/20/22   8:26 AM EDT

## 2022-09-28 ENCOUNTER — Ambulatory Visit: Payer: PRIVATE HEALTH INSURANCE | Primary: Family Medicine

## 2022-09-28 DIAGNOSIS — I4891 Unspecified atrial fibrillation: Secondary | ICD-10-CM

## 2022-09-28 LAB — ECHOCARDIOGRAM COMPLETE 2D W DOPPLER W COLOR: Left Ventricular Ejection Fraction: 60

## 2022-10-02 ENCOUNTER — Encounter

## 2022-10-08 NOTE — Telephone Encounter (Signed)
Pt has been out for 4 days pt needs this prescription

## 2022-10-10 ENCOUNTER — Encounter

## 2022-10-10 MED ORDER — RIVAROXABAN 20 MG PO TABS
20 MG | ORAL_TABLET | Freq: Every day | ORAL | 3 refills | Status: DC
Start: 2022-10-10 — End: 2023-08-15

## 2022-10-10 NOTE — Telephone Encounter (Signed)
Pts girlfied called she is extremely upset, needs this ASAP [facility] on [address]

## 2022-10-10 NOTE — Progress Notes (Signed)
1. PAF (paroxysmal atrial fibrillation) (HCC)  -     rivaroxaban (XARELTO) 20 MG TABS tablet; Take 1 tablet by mouth daily, Disp-90 tablet, R-3Normal

## 2022-10-11 NOTE — Telephone Encounter (Signed)
Spoke to patient's fiancee (patient is at work). She was notified of echo results. She states Zio monitor was returned earlier than planned due to skin irritation as the site. Results are not available, so will follow up and call them back. He did not have labs drawn yet; she states he will go this weekend. Will plan for follow up with Dr. Anell Barr in 6 months; sooner if monitor or labs indicate a need. She states understanding and appreciated the call.

## 2022-10-30 ENCOUNTER — Other Ambulatory Visit: Payer: Self-pay | Admitting: Family Medicine

## 2022-10-30 ENCOUNTER — Telehealth: Payer: Self-pay

## 2022-10-30 MED ORDER — ALPRAZOLAM 0.5 MG PO TABS: 0.5000 mg | ORAL_TABLET | Freq: Three times a day (TID) | ORAL | 2 refills | Status: DC | PRN

## 2022-10-30 NOTE — Telephone Encounter (Signed)
Pt called requesting a refill on his Alprazolam. Last RF was 07/13/2021. Last OV 09/29/2021. Pt uses [facility] on  [address] in [place]. Okay to Refill?

## 2022-10-30 NOTE — Telephone Encounter (Signed)
Error

## 2022-12-13 MED ORDER — LOSARTAN POTASSIUM 50 MG PO TABS
50 MG | ORAL_TABLET | Freq: Every day | ORAL | 3 refills | Status: AC
Start: 2022-12-13 — End: 2024-01-16

## 2022-12-18 ENCOUNTER — Ambulatory Visit
Admit: 2022-12-18 | Discharge: 2022-12-18 | Payer: PRIVATE HEALTH INSURANCE | Attending: Family | Primary: Family Medicine

## 2022-12-18 DIAGNOSIS — J101 Influenza due to other identified influenza virus with other respiratory manifestations: Secondary | ICD-10-CM

## 2022-12-18 LAB — POCT COVID-19 & INFLUENZA A/B
Influenza A Antigen, POC: POSITIVE
Influenza B Antigen, POC: NEGATIVE
Lot/Kit Number: 3208041
Lot/Kit expire date:: 11102024
SARS-COV-2, POC: NOT DETECTED

## 2022-12-18 MED ORDER — OSELTAMIVIR PHOSPHATE 75 MG PO CAPS
75 MG | ORAL_CAPSULE | Freq: Two times a day (BID) | ORAL | 0 refills | Status: AC
Start: 2022-12-18 — End: 2022-12-23

## 2022-12-18 MED ORDER — PROMETHAZINE-DM 6.25-15 MG/5ML PO SYRP
Freq: Four times a day (QID) | ORAL | 0 refills | Status: AC | PRN
Start: 2022-12-18 — End: 2022-12-25

## 2022-12-18 NOTE — Progress Notes (Signed)
Chief Complaint:   Cough, Generalized Body Aches, Fever, and Sore Throat      History of Present Illness   Source of history provided by:  patient.      Thomas Walton is a 51 y.o. old male with a past medical history of:   Past Medical History:   Diagnosis Date    Asthma     Diabetes ([place])     HLD (hyperlipidemia)     HTN (hypertension)     PAF (paroxysmal atrial fibrillation) (HCC)        Pt  presents to the [facility] with a cough/sore throat/congestion/body aches/fevers for the past 1 day.  States the cough is non productive.  Subjective fever noted.   Denies any N/V/D, abdominal pain, CP, progressive SOB, dizziness, or any other concerning issues       ROS    Unless otherwise stated in this report or unable to obtain because of the patient's clinical or mental status as evidenced by the medical record, this patients's positive and negative responses for Review of Systems, constitutional, psych, eyes, ENT, cardiovascular, respiratory, gastrointestinal, neurological, genitourinary, musculoskeletal, integument systems and systems related to the presenting problem are either stated in the preceding or were not pertinent or were negative for the symptoms and/or complaints related to the medical problem.    Past Surgical History:  has a past surgical history that includes Cardiac catheterization (12/12/2020) and Abdomen surgery (Right, 05/22/2022).  Social History:  reports that he has never smoked. He has never used smokeless tobacco. He reports current alcohol use. He reports that he does not use drugs.  Family History: family history includes Heart Attack in his father.   Allergies: Shellfish-derived products    Physical Exam         VS:  BP 125/75   Pulse (!) 104   Temp 97.3 F (36.3 C) (Temporal)   Resp 20   Ht 1.905 m (_0 )   SpO2 93%   BMI 33.75 kg/m    Oxygen Saturation Interpretation: Normal.    Constitutional:  Alert, development consistent with age.  Ears:  External Ears: Normal bilateral  pinna.             TM's & External Canals: TM's normal bilaterally without perforation.  Canals without erythema or drainage.    Nose:   There is no obvious septal defect. Mild redness/edema  Mouth:  Moist bucca mucosa and normal tongue.    Throat: Mild posterior pharyngeal erythema without exudates or lesions.   Neck:  Supple. There is no obvious adenopathy or neck tenderness.  Lungs:   Breath sounds: Normal chest expansion and breath sounds noted throughout.  No wheezes, rales, or rhonchi noted.    Heart:  Apical 104, normal heart sounds, without pathological murmurs, ectopy, gallops, or rubs.  Skin:  Normal turgor.  Warm, dry, without visible rash.  Neurological:  Oriented.  Motor functions intact.       Lab / Imaging Results   (All laboratory and radiology results have been personally reviewed by myself)  Labs:  Results for orders placed or performed in visit on 12/18/22   POCT COVID-19 & Influenza A/B   Result Value Ref Range    VALID INTERNAL CONTROL pass     Lot/Kit Number [id number]     Lot/Kit expire date: 11,102,024     SARS-COV-2, POC Not-Detected Not Detected    Influenza A Antigen, POC Positive     Influenza B Antigen, POC Negative  Vendor and kit name [employer]        Imaging:  All Radiology results interpreted by Radiologist unless otherwise noted.  No orders to display         Assessment / Plan     Impression(s):  1. Influenza A    2. Cough in adult patient      Disposition:  Disposition: Advised Influenza A is POSITIVE. Covid test is negative. Start Tamiflu and Promethazine DM- advised on sedating effects. Stay well hydrated. Return to walk in care w/any worsening or concerning issues

## 2023-01-03 ENCOUNTER — Telehealth

## 2023-01-03 MED ORDER — DOXYCYCLINE HYCLATE 100 MG PO CAPS
100 MG | ORAL_CAPSULE | Freq: Two times a day (BID) | ORAL | 0 refills | Status: AC
Start: 2023-01-03 — End: 2023-01-10

## 2023-01-03 MED ORDER — PREDNISONE 20 MG PO TABS
20 MG | ORAL_TABLET | Freq: Every day | ORAL | 0 refills | Status: AC
Start: 2023-01-03 — End: 2023-01-08

## 2023-01-03 NOTE — Telephone Encounter (Signed)
1. Acute cough  -     doxycycline hyclate (VIBRAMYCIN) 100 MG capsule; Take 1 capsule by mouth 2 times daily for 7 days, Disp-14 capsule, R-0Normal  -     predniSONE (DELTASONE) 20 MG tablet; Take 2 tablets by mouth daily for 5 days, Disp-10 tablet, R-0Normal  2. Sinobronchitis  -     doxycycline hyclate (VIBRAMYCIN) 100 MG capsule; Take 1 capsule by mouth 2 times daily for 7 days, Disp-14 capsule, R-0Normal  -     predniSONE (DELTASONE) 20 MG tablet; Take 2 tablets by mouth daily for 5 days, Disp-10 tablet, R-0Normal

## 2023-01-03 NOTE — Telephone Encounter (Signed)
Pt came int walk in still has a bad cough, tight chest from coughing, can something else be called in

## 2023-01-03 NOTE — Telephone Encounter (Signed)
Pt called and notified voiced understanding

## 2023-01-03 NOTE — Telephone Encounter (Signed)
Sent in doxy plus prednsione   '

## 2023-01-10 ENCOUNTER — Encounter

## 2023-01-10 NOTE — Progress Notes (Signed)
1. PAF (paroxysmal atrial fibrillation) ([place])  -      - [facility], [place], [place]

## 2023-01-11 ENCOUNTER — Encounter: Payer: PRIVATE HEALTH INSURANCE | Attending: Orthopaedic Surgery | Primary: Family Medicine

## 2023-01-14 ENCOUNTER — Encounter

## 2023-01-14 MED ORDER — METFORMIN HCL ER 500 MG PO TB24
500 MG | ORAL_TABLET | Freq: Every day | ORAL | 1 refills | Status: AC
Start: 2023-01-14 — End: 2023-07-13

## 2023-01-15 ENCOUNTER — Encounter: Payer: PRIVATE HEALTH INSURANCE | Attending: Family Medicine | Primary: Family Medicine

## 2023-01-15 ENCOUNTER — Ambulatory Visit
Admit: 2023-01-15 | Discharge: 2023-01-15 | Payer: PRIVATE HEALTH INSURANCE | Attending: Family | Primary: Family Medicine

## 2023-01-15 DIAGNOSIS — J22 Unspecified acute lower respiratory infection: Secondary | ICD-10-CM

## 2023-01-15 MED ORDER — AZITHROMYCIN 250 MG PO TABS
250 MG | ORAL_TABLET | ORAL | 0 refills | Status: AC
Start: 2023-01-15 — End: 2023-01-20

## 2023-01-15 MED ORDER — PREDNISONE 10 MG PO TABS
10 MG | ORAL_TABLET | Freq: Two times a day (BID) | ORAL | 0 refills | Status: AC
Start: 2023-01-15 — End: 2023-01-20

## 2023-01-15 NOTE — Progress Notes (Signed)
Chief Complaint:   Cough (X 2 weeks non productive, pt states he had the flu the week prior to christmas , cough came back and will not go away)      History of Present Illness   Source of history provided by:  patient.      Thomas Walton is a 51 y.o. old male with a past medical history of:   Past Medical History:   Diagnosis Date    Asthma     Diabetes ([place])     HLD (hyperlipidemia)     HTN (hypertension)     PAF (paroxysmal atrial fibrillation) (HCC)         Pt presents to the [facility] Care with a cough/chest congestion for the past few weeks  States the cough is   non productive.  No fever noted.   Denies any N/V/D, abdominal pain, CP, progressive SOB, dizziness, or lethargy.  Pt was dx with Influenza A on 12/18/22 and treated w/Tamiflu and Promethazine DM           ROS    Unless otherwise stated in this report or unable to obtain because of the patient's clinical or mental status as evidenced by the medical record, this patients's positive and negative responses for Review of Systems, constitutional, psych, eyes, ENT, cardiovascular, respiratory, gastrointestinal, neurological, genitourinary, musculoskeletal, integument systems and systems related to the presenting problem are either stated in the preceding or were not pertinent or were negative for the symptoms and/or complaints related to the medical problem.    Past Surgical History:  has a past surgical history that includes Cardiac catheterization (12/12/2020) and Abdomen surgery (Right, 05/22/2022).  Social History:  reports that he has never smoked. He has never used smokeless tobacco. He reports current alcohol use. He reports that he does not use drugs.  Family History: family history includes Heart Attack in his father.   Allergies: Shellfish-derived products    Physical Exam         VS:  BP 117/60   Pulse 81   Temp 97.7 F (36.5 C)   Ht 1.905 m (6\' 3" )   Wt 133.8 kg (295 lb)   SpO2 96%   BMI 36.87 kg/m    Oxygen Saturation Interpretation:  Normal.    Constitutional:  Alert, development consistent with age.  Ears:  External Ears: Normal bilateral pinna.             TM's & External Canals: TM's normal bilaterally without perforation.  Canals without erythema or drainage.    Nose:   There is no obvious septal defect.  Mouth:  Moist bucca mucosa and normal tongue.    Throat: No posterior pharyngeal erythema , exudates or lesions.   Neck:  Supple. There is no obvious adenopathy or neck tenderness.  Lungs:   Breath sounds: Normal chest expansion, breath sounds: mildly decreased  Heart:  Regular rate and rhythm, normal heart sounds, without pathological murmurs, ectopy, gallops, or rubs.  Skin:  Normal turgor.  Warm, dry, without visible rash.  Neurological:  Oriented.  Motor functions intact.       Lab / Imaging Results   (All laboratory and radiology results have been personally reviewed by myself)  Labs:  No results found for this visit on 01/15/23.    Imaging:  All Radiology results interpreted by Radiologist unless otherwise noted.  No orders to display         Assessment / Plan     Impression(s):  1.  Acute lower respiratory tract infection    2. Cough in adult patient      Disposition:  Disposition: Advised to start Zithromax and Prednisone as ordered. Stay well hydrated. Return to walk in care w/any worsening or concerning issues

## 2023-01-25 MED ORDER — ATORVASTATIN CALCIUM 40 MG PO TABS
40 | ORAL_TABLET | Freq: Every evening | ORAL | 3 refills | 90.00000 days | Status: DC
Start: 2023-01-25 — End: 2024-05-03

## 2023-02-13 ENCOUNTER — Encounter: Payer: PRIVATE HEALTH INSURANCE | Attending: Family Medicine | Primary: Family Medicine

## 2023-02-13 MED ORDER — METOPROLOL SUCCINATE ER 50 MG PO TB24
50 | ORAL_TABLET | Freq: Two times a day (BID) | ORAL | 3 refills | 63.50000 days | Status: DC
Start: 2023-02-13 — End: 2024-04-28

## 2023-02-22 ENCOUNTER — Ambulatory Visit
Admit: 2023-02-22 | Discharge: 2023-02-22 | Payer: PRIVATE HEALTH INSURANCE | Attending: Family | Primary: Family Medicine

## 2023-02-22 DIAGNOSIS — M542 Cervicalgia: Secondary | ICD-10-CM

## 2023-02-22 NOTE — Progress Notes (Signed)
Chief Complaint:   Neck Pain (right)      History of Present Illness   Source of history provided by:  patient.      Thomas Walton is a 52 y.o. old male with a past medical history of:   Past Medical History:   Diagnosis Date    Asthma     Diabetes ([place])     HLD (hyperlipidemia)     HTN (hypertension)     PAF (paroxysmal atrial fibrillation) (HCC)         [facility] with c/o right sided neck pain for the past few weeks. Pt stated he may have a mass or swelling to site of tenderness. Pt stated when he looks in the mirror , his right side of his neck looks different.  No fever noted.  Denies any recent/past injuries.  Denies any N/V/D, difficulty swallowing, sore throat, CP, progressive SOB, dizziness, or lethargy.           ROS    Unless otherwise stated in this report or unable to obtain because of the patient's clinical or mental status as evidenced by the medical record, this patients's positive and negative responses for Review of Systems, constitutional, psych, eyes, ENT, cardiovascular, respiratory, gastrointestinal, neurological, genitourinary, musculoskeletal, integument systems and systems related to the presenting problem are either stated in the preceding or were not pertinent or were negative for the symptoms and/or complaints related to the medical problem.    Past Surgical History:  has a past surgical history that includes Cardiac catheterization (12/12/2020) and Abdomen surgery (Right, 05/22/2022).  Social History:  reports that he has never smoked. He has never used smokeless tobacco. He reports current alcohol use. He reports that he does not use drugs.  Family History: family history includes Heart Attack in his father.   Allergies: Shellfish-derived products    Physical Exam         VS:  BP 132/79   Pulse 68   Temp 97.7 F (36.5 C)   Ht 1.905 m (6' 3"$ )   Wt 134.7 kg (297 lb)   SpO2 97%   BMI 37.12 kg/m    Oxygen Saturation Interpretation: Normal.    Constitutional:   Alert, development consistent with age.  Ears:  External Ears: Normal bilateral pinna.             TM's & External Canals: TM's normal bilaterally without perforation.  Canals without erythema or drainage.    Nose:   There is no obvious septal defect.  Mouth:  Moist bucca mucosa and normal tongue.    Throat: No posterior pharyngeal erythema, exudates or lesions.   Neck:  Supple. Mild tenderness present to right anterior cervical region.  No thyromegaly, lymphadenopathy or masses palpated  Lungs:   Breath sounds: Normal chest expansion and breath sounds noted throughout.  No wheezes, rales, or rhonchi noted.    Heart:  Regular rate and rhythm, normal heart sounds, without pathological murmurs, ectopy, gallops, or rubs.  Skin:  Normal turgor.  Warm, dry, without visible rash.  Neurological:  Oriented.  Motor functions intact.       Lab / Imaging Results   (All laboratory and radiology results have been personally reviewed by myself)  Labs:  No results found for this visit on 02/22/23.    Imaging:  All Radiology results interpreted by Radiologist unless otherwise noted.  No orders to display         Assessment / Plan  Impression(s):  1. Neck pain on right side      Disposition:  Disposition: Advised pt no masses or lymphadenopathy present at site of pain. Advised to keep scheduled appointment w/PCP on 03/05/23 for further evaluation.

## 2023-03-01 ENCOUNTER — Encounter: Payer: PRIVATE HEALTH INSURANCE | Attending: Family Medicine | Primary: Family Medicine

## 2023-03-05 ENCOUNTER — Ambulatory Visit
Admit: 2023-03-05 | Discharge: 2023-03-05 | Payer: PRIVATE HEALTH INSURANCE | Attending: Family Medicine | Primary: Family Medicine

## 2023-03-05 DIAGNOSIS — E119 Type 2 diabetes mellitus without complications: Secondary | ICD-10-CM

## 2023-03-05 LAB — POCT MICROALBUMIN
Creatinine Ur POCT: 100
Microalb, Ur: 10
Microalbumin Creatinine Ratio: <30

## 2023-03-05 LAB — POCT GLYCOSYLATED HEMOGLOBIN (HGB A1C): Hemoglobin A1C: 7.5 %

## 2023-03-05 NOTE — Progress Notes (Addendum)
Thomas Walton (DOB:  1971-11-25) is a 52 y.o. male, established patient follow up , here for evaluation of the following:  Diabetes and Hypertension    Addendum 03/19/2023 to change thyroid biopsy to RN sedation  9. Thyroid nodule  -     IR US THYROID BIOPSY; Future  RECOMMENDATIONS:  Recommend FNA of the large TR 3 nodule in the right thyroid.  Somewhat  suspicious given the size and asymmetry.     The findings were sent to the [facility] [place] at 2:25  pm on 03/16/2023 to be communicated to a licensed caregiver.              Specimen Collected: 03/16/23 14:23 EDT                  ASSESSMENT/PLAN  Declines viral screenings and vaccines    1. Type 2 diabetes mellitus without complication, without long-term current use of insulin (HCC)  Chronic, has been better controlled in the past, was at 7, now at 7.5, recommended he go back to eating healthy nutrition, no protein in his urine, recommended foot exam by podiatrist as well as eye exam, he notes he will get them scheduled  -     POCT glycosylated hemoglobin (Hb A1C)  -     POCT microalbumin  2. Vitamin D deficiency  Chronic, unclear control, will recheck now, or prior to next visit as ordered  -     Vitamin D 25 Hydroxy; Future  3. Primary hypertension  Chronic, well controlled, continue current medications and treatment plan, due for labs  -     TSH; Future  -     Comprehensive Metabolic Panel; Future  -     CBC with Auto Differential; Future  4. Paroxysmal atrial fibrillation (HCC)  Appears to be in regular rhythm today, is on Xarelto, nervous to exercise again, did recommend slow return to exercise, light weights, body weight exercises, walking, insurance will not cover cardiac rehab  5. Other proteinuria  Resolved, go back to yearly monitoring  6. Pure hypercholesterolemia  Should be on statin with the diabetes, will check cholesterol levels and then start likely high intensity statin due to his diabetes-     Lipid Panel; Future  7. Mass  of right side of neck  He does have visible mass on the right side of his neck, it does appear to be about 2 cm in diameter round, soft, mobile, possibly thyroid cyst, will get ultrasound, he is on blood thinners and has had hematomas in the past, he has not had any injury to his neck, no signs of voice change or dysphagia  -     [place] HEAD NECK SOFT TISSUE THYROID; Future  8. Screening for colon cancer  -     Cologuard (Fecal DNA Colorectal Cancer Screening)    Return in about 3 months (around 06/05/2023) for Diabetes follow up.         Subjective   Portions of this note were completed by scribe Valetta Mole during the course of the visit.  All decision-making was completed by, and the note in its entirety was reviewed by myself, Gillian Shields MD.    SUBJECTIVE/OBJECTIVE:  Diabetes  Pertinent negatives for hypoglycemia include no headaches or nervousness/anxiousness. Pertinent negatives for diabetes include no chest pain, no fatigue and no weakness.   Hypertension  Pertinent negatives include no chest pain, headaches, palpitations or shortness of breath.     Pt is here for  follow up     A1C 7.5, LV 7.0  Pt reports he cheats sometimes   Pt wants to try and improve nutrition     Pt does not exercise, but he is scared to start   Tried to go to cardiac rehab was not covered by insurance   Pt is able to walk for exercise - usually getting 4500-6000 steps a day   Pt feels he is able to do body weight exercises      Pt is staying hydrated     Pt is having swelling in right side neck for 3 weeks, no trouble swallowing or change in voice.     Declined preventative screening identified as care gaps unless ordered through this visit    PHQ2/PHQ9  PHQ-2 Score: 0  PHQ-2 Over the past 2 weeks, how often have you been bothered by any of the following problems?  Little interest or pleasure in doing things: Not at all  Feeling down, depressed, or hopeless: Not at all  PHQ-2 Score: 0   PHQ-9 Total Score: 0 (03/05/2023 12:47 PM)       Past  Medical History:  has a past medical history of Asthma, Diabetes ([place]), HLD (hyperlipidemia), HTN (hypertension), and PAF (paroxysmal atrial fibrillation) ([place]).   Past Surgical History:  has a past surgical history that includes Cardiac catheterization (12/12/2020) and Abdomen surgery (Right, 05/22/2022).  Social History:  reports that he has never smoked. He has never used smokeless tobacco. He reports current alcohol use. He reports that he does not use drugs.  Family History: family history includes Heart Attack in his father.  Allergies: Shellfish-derived products  Medications:   Current Outpatient Medications   Medication Sig Dispense Refill    metoprolol succinate (TOPROL XL) 50 MG extended release tablet Take 1 tablet by mouth in the morning and at bedtime 180 tablet 3    atorvastatin (LIPITOR) 40 MG tablet Take 1 tablet by mouth at bedtime 90 tablet 3    metFORMIN (GLUCOPHAGE-XR) 500 MG extended release tablet Take 1 tablet by mouth daily 90 tablet 1    losartan (COZAAR) 50 MG tablet Take 1 tablet by mouth daily 90 tablet 3    rivaroxaban (XARELTO) 20 MG TABS tablet Take 1 tablet by mouth daily 90 tablet 3    empagliflozin (JARDIANCE) 25 MG tablet Take 1 tablet by mouth daily 90 tablet 1     No current facility-administered medications for this visit.      Allergies: Shellfish-derived products     Review of Systems   Constitutional:  Negative for chills, fatigue, fever and unexpected weight change.   HENT:  Negative for hearing loss, sore throat, trouble swallowing and voice change.    Eyes:  Negative for visual disturbance.   Respiratory:  Negative for cough, shortness of breath and wheezing.    Cardiovascular:  Negative for chest pain, palpitations and leg swelling.   Gastrointestinal:  Negative for abdominal pain, blood in stool, constipation, diarrhea and nausea.   Genitourinary:  Negative for dysuria.   Musculoskeletal:  Negative for arthralgias.        Neck mass on the right   Neurological:  Negative  for weakness, light-headedness, numbness and headaches.   Psychiatric/Behavioral:  Negative for dysphoric mood and sleep disturbance. The patient is not nervous/anxious.    All other systems reviewed and are negative.         Objective   BP 136/83   Pulse 80   Temp 96.8 F (36  C) (Temporal)   Resp 16   Ht 1.905 m (6\' 3" )   Wt 133.8 kg (295 lb)   SpO2 97%   BMI 36.87 kg/m       Lab Results   Component Value Date    LABA1C 7.5 03/05/2023    LABA1C 7.0 08/30/2022    LABA1C 7.0 03/20/2022     Lab Results   Component Value Date    CHOL 220 (H) 12/06/2020     Lab Results   Component Value Date    TRIG 334 (H) 12/06/2020     Lab Results   Component Value Date    HDL 33 12/06/2020     Lab Results   Component Value Date    LDLCALC 120 (H) 12/06/2020     No results found for: "VLDL"  No results found for: "CHOLHDLRATIO"   Creatinine   Date Value Ref Range Status   12/15/2021 1.2 0.7 - 1.2 mg/dL Final   04/30/2021 1.1 0.7 - 1.2 mg/dL Final   12/12/2020 1.1 0.7 - 1.2 mg/dL Final       The 10-year ASCVD risk score (Arnett DK, et al., 2019) is: 19.6%    Values used to calculate the score:      Age: 89 years      Sex: Male      Is Non-Hispanic African American: Yes      Diabetic: Yes      Tobacco smoker: No      Systolic Blood Pressure: XX123456 mmHg      Is BP treated: Yes      HDL Cholesterol: 33 mg/dL      Total Cholesterol: 220 mg/dL     Physical Exam  Constitutional:       General: He is not in acute distress.     Appearance: Normal appearance.   HENT:      Head: Normocephalic and atraumatic.      Right Ear: External ear normal.      Left Ear: External ear normal.      Nose: Nose normal.      Mouth/Throat:      Mouth: Mucous membranes are moist.   Eyes:      Extraocular Movements: Extraocular movements intact.      Conjunctiva/sclera: Conjunctivae normal.   Neck:     Cardiovascular:      Rate and Rhythm: Normal rate and regular rhythm.      Heart sounds: No murmur heard.  Pulmonary:      Effort: Pulmonary effort is  normal.      Breath sounds: Normal breath sounds. No wheezing.   Musculoskeletal:         General: Normal range of motion.      Cervical back: Normal range of motion and neck supple.   Lymphadenopathy:      Cervical: No cervical adenopathy.   Neurological:      General: No focal deficit present.      Mental Status: He is alert.   Psychiatric:         Mood and Affect: Mood normal.         Behavior: Behavior normal.             An electronic signature was used to authenticate this note.    --Gillian Shields, MD       *NOTE: This report was transcribed using voice recognition software. Every effort was made to ensure accuracy; however, inadvertent computerized transcription errors may be present.

## 2023-03-05 NOTE — Patient Instructions (Signed)
[facility]  2.3    3 [employer] reviews     Medical diagnostic imaging center in [place], [place]  Call     Located in: [facility]  Address: [address], [place] [address]  Hours: Open ? Closes 5?PM  Phone: [phone]

## 2023-03-14 ENCOUNTER — Inpatient Hospital Stay: Payer: PRIVATE HEALTH INSURANCE | Primary: Family Medicine

## 2023-03-14 ENCOUNTER — Ambulatory Visit: Payer: PRIVATE HEALTH INSURANCE | Primary: Family Medicine

## 2023-03-14 DIAGNOSIS — I10 Essential (primary) hypertension: Secondary | ICD-10-CM

## 2023-03-14 DIAGNOSIS — E049 Nontoxic goiter, unspecified: Secondary | ICD-10-CM

## 2023-03-14 DIAGNOSIS — R221 Localized swelling, mass and lump, neck: Secondary | ICD-10-CM

## 2023-03-14 LAB — COMPREHENSIVE METABOLIC PANEL
ALT: 30 U/L (ref 0–40)
AST: 22 U/L (ref 0–39)
Albumin: 4.5 g/dL (ref 3.5–5.2)
Alkaline Phosphatase: 70 U/L (ref 40–129)
Anion Gap: 11 mmol/L (ref 7–16)
BUN: 11 mg/dL (ref 6–20)
CO2: 27 mmol/L (ref 22–29)
Calcium: 9.6 mg/dL (ref 8.6–10.2)
Chloride: 104 mmol/L (ref 98–107)
Creatinine: 1.1 mg/dL (ref 0.70–1.20)
Est, Glom Filt Rate: >60 mL/min/{1.73_m2} (ref >60)
Glucose: 98 mg/dL (ref 74–99)
Potassium: 3.8 mmol/L (ref 3.5–5.0)
Sodium: 142 mmol/L (ref 132–146)
Total Bilirubin: 0.5 mg/dL (ref 0.0–1.2)
Total Protein: 7.6 g/dL (ref 6.4–8.3)

## 2023-03-14 LAB — LIPID PANEL
Cholesterol: 148 mg/dL (ref <200)
HDL: 34 mg/dL — ABNORMAL LOW (ref >40)
LDL Cholesterol: 83 mg/dL (ref <100)
Triglycerides: 154 mg/dL — ABNORMAL HIGH (ref <150)
VLDL: 31 mg/dL

## 2023-03-14 LAB — VITAMIN D 25 HYDROXY: Vit D, 25-Hydroxy: 16.3 ng/mL — ABNORMAL LOW (ref 30.0–100.0)

## 2023-03-14 LAB — CBC WITH AUTO DIFFERENTIAL
Absolute Immature Granulocyte: <0.03 10*3/uL (ref 0.00–0.58)
Basophils %: 1 % (ref 0.0–2.0)
Basophils Absolute: 0.04 10*3/uL (ref 0.00–0.20)
Eosinophils %: 2 % (ref 0–6)
Eosinophils Absolute: 0.06 10*3/uL (ref 0.05–0.50)
Hematocrit: 45.5 % (ref 37.0–54.0)
Hemoglobin: 14.8 g/dL (ref 12.5–16.5)
Immature Granulocytes: 0 % (ref 0.0–5.0)
Lymphocytes %: 42 % (ref 20.0–42.0)
Lymphocytes Absolute: 1.58 10*3/uL (ref 1.50–4.00)
MCH: 28.2 pg (ref 26.0–35.0)
MCHC: 32.5 g/dL (ref 32.0–34.5)
MCV: 86.8 fL (ref 80.0–99.9)
MPV: 8.4 fL (ref 7.0–12.0)
Monocytes %: 10 % (ref 2.0–12.0)
Monocytes Absolute: 0.39 10*3/uL (ref 0.10–0.95)
Neutrophils %: 45 % (ref 43.0–80.0)
Neutrophils Absolute: 1.73 10*3/uL — ABNORMAL LOW (ref 1.80–7.30)
Platelets: 236 10*3/uL (ref 130–450)
RBC: 5.24 m/uL (ref 3.80–5.80)
RDW: 14 % (ref 11.5–15.0)
WBC: 3.8 10*3/uL — ABNORMAL LOW (ref 4.5–11.5)

## 2023-03-14 LAB — TSH: TSH: 0.91 u[IU]/mL (ref 0.27–4.20)

## 2023-03-18 ENCOUNTER — Encounter

## 2023-03-18 NOTE — Progress Notes (Signed)
I did call and talk to wife about the thyroid biopsy, she does know he is deathly afraid of needles, will get this biopsy done under sedation, will do have medical professionals within their family and they have consulted so have a good understanding of what the potential problem could be as well as what the FNA procedure will involve, wife notes she will come with the patient's that he will be able to get the sedation.    1. Thyroid nodule  -     [facility]  -     IR US THYROID BIOPSY; Future    Narrative & Impression  EXAMINATION:  THYROID ULTRASOUND     03/14/2023     COMPARISON:  None.     HISTORY:  ORDERING SYSTEM PROVIDED HISTORY: Mass of right side of neck  TECHNOLOGIST PROVIDED HISTORY:  This procedure can be scheduled via MyChart.  Access your MyChart account by  visiting [email or web address]  Reason for exam:->right neck mass  What reading provider will be dictating this exam?->CRC     FINDINGS:  Right thyroid lobe: 8.0 x 4.6 x 4.5cm     Left thyroid lobe: 6.4 x 2.1 x 2.6cm     Isthmus: 0.4cm     Echotexture: Enlarged thyroid gland right slightly larger than left.     Vascularity: Increased vascularity throughout.     Thyroid Nodules:     Dominant nodule on the right.     NODULE: Right 1     Size: 63 x 50 x 36 mm     Location: Right thyroid     1. Composition:  Solid (2)     2. Echogenicity: Heterogeneously Isoechoic (1)     3. Shape:  Wider-than-tall (0)     4. Margins:  Ill-defined (0)     5. Echogenic foci:  None (0)     ACR TI-RADS total points:  3     ACR TI-RADS risk category: TR 3     Prior biopsy: No     Soft tissues: No visualized lymphadenopathy     IMPRESSION:  1.  Enlarged thyroid gland right slightly larger than left.  Heterogeneous  thyroid parenchyma with slightly increased vascularity throughout.     2.  Dominant nodule on the right.  63 mm TR 3 nodule meets criteria for FNA.     ACR TI-RADS recommendations:     TR5 (>= 7 points):  FNA if >= 1 cm; follow-up if 0.5-0.9 cm in  1, 2, 3, 4,  and 5 years     TR4 (4-6 points):  FNA if >= 1.5 cm; follow-up if 1.0-1.4 cm in 1, 2, 3, and  5 years     TR3 (3 points):  FNA if >= 2.5 cm; follow-up if 1.5-2.4 cm in 1, 3, and 5  years     TR2 (2 points):  No FNA or follow-up     TR1 (0 points):  No FNA or follow-up     ACR TI-RADS recommends that no more than two nodules with the highest ACR  TI-RADS point total should be biopsied and no more than four nodules should  be followed.     RECOMMENDATIONS:  Recommend FNA of the large TR 3 nodule in the right thyroid.  Somewhat  suspicious given the size and asymmetry.     The findings were sent to the Radiology [facility] [place] at 2:25  pm on 03/16/2023 to be communicated to a licensed caregiver.  Specimen Collected: 03/16/23 14:23 EDT Last Resulted: 03/16/23 14:25 EDT       Order Details        View Encounter        Lab and Collection Details        Routing        Result History     View All Conversations on this Encounter

## 2023-03-19 NOTE — Addendum Note (Signed)
Addended by: Gillian Shields on: 03/19/2023 04:29 PM     Modules accepted: Orders

## 2023-03-19 NOTE — Telephone Encounter (Signed)
Please call and make sure that they know that I did add the addendum to the note from last visit

## 2023-03-19 NOTE — Telephone Encounter (Signed)
Addendum added to note from last visit

## 2023-03-19 NOTE — Telephone Encounter (Signed)
Please draw up a new order on the Thyroid Biopsy so that it states "RN Sedation", this way pt will be put in a Tw[place] state, per Radiology, thanks

## 2023-03-22 NOTE — Progress Notes (Signed)
Chart audit performed on CRC topic. No results found in chart.

## 2023-03-25 NOTE — Patient Instructions (Signed)
Pre procedure telephone call made to verify meds, allergies, history and any special arrangements needed for thyroid biopsy with sedation prcoedure. Blood thinners held.  Patient instructed to where and when to report for procedure.  Patient instructed to fast after midnight, may take AM meds with sip of water, dress comfortably, will need a driver and approximate length of time they will be here.   Chart assembled with appropriate papers  1pm  arrival time

## 2023-03-27 ENCOUNTER — Ambulatory Visit
Admit: 2023-03-27 | Discharge: 2023-03-27 | Payer: PRIVATE HEALTH INSURANCE | Attending: Orthopaedic Surgery | Primary: Family Medicine

## 2023-03-27 ENCOUNTER — Ambulatory Visit: Admit: 2023-03-27 | Discharge: 2023-03-31 | Payer: PRIVATE HEALTH INSURANCE | Primary: Family Medicine

## 2023-03-27 DIAGNOSIS — M1712 Unilateral primary osteoarthritis, left knee: Secondary | ICD-10-CM

## 2023-03-27 NOTE — Patient Instructions (Signed)
UPCOMING ORTHOPAEDIC SURGERY SCHEDULING INFORMATION  TOTAL JOINT ARTHROPLASTY    PROCEDURE: LEFT MAKO ROBOTIC ASSISTED TOTAL JOINT ARTHROPLASTY   DATE: 05/27/2023  FACILITY: [facility]     Please review your surgical handout and call the office at [phone] with any questions.   If requested, please obtain any clearances prior to surgery. It is patients responsibility to notify providers of any clearances needed, if your provider is requesting a form to be sent, please call the office.   Expect a call from pre-admission testing the week prior regarding surgery instructions.  If you are to obtain any imaging prior to surgery, please make sure to answer the call from radiology regarding scheduling. Radiology's number is [phone].  An authorization request will be obtained by the office for any insurance approval needs.  If you are to cancel surgery, please call the office with at least a 72 hour minimum, failure to do so will leave it up to the providers discretion for re-scheduling.
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
## 2023-04-01 ENCOUNTER — Inpatient Hospital Stay: Admit: 2023-04-01 | Payer: PRIVATE HEALTH INSURANCE | Primary: Family Medicine

## 2023-04-01 ENCOUNTER — Encounter

## 2023-04-01 DIAGNOSIS — E041 Nontoxic single thyroid nodule: Secondary | ICD-10-CM

## 2023-04-01 MED ORDER — FENTANYL CITRATE (PF) 100 MCG/2ML IJ SOLN
100 | INTRAMUSCULAR | Status: AC
Start: 2023-04-01 — End: ?

## 2023-04-01 MED ORDER — DIPHENHYDRAMINE HCL 50 MG/ML IJ SOLN
50 | INTRAMUSCULAR | Status: AC
Start: 2023-04-01 — End: ?

## 2023-04-01 MED ORDER — DIPHENHYDRAMINE HCL 50 MG/ML IJ SOLN
50 | INTRAMUSCULAR | Status: AC | PRN
Start: 2023-04-01 — End: 2023-04-01
  Administered 2023-04-01: 17:00:00 25 via INTRAVENOUS

## 2023-04-01 MED ORDER — MIDAZOLAM HCL 2 MG/2ML IJ SOLN
2 | INTRAMUSCULAR | Status: AC
Start: 2023-04-01 — End: ?

## 2023-04-01 MED ORDER — FENTANYL CITRATE (PF) 100 MCG/2ML IJ SOLN
100 | INTRAMUSCULAR | Status: AC | PRN
Start: 2023-04-01 — End: 2023-04-01
  Administered 2023-04-01: 17:00:00 25 via INTRAVENOUS

## 2023-04-01 MED ORDER — MIDAZOLAM HCL (PF) 2 MG/2ML IJ SOLN
2 | INTRAMUSCULAR | Status: AC | PRN
Start: 2023-04-01 — End: 2023-04-01
  Administered 2023-04-01: 17:00:00 .5 via INTRAVENOUS

## 2023-04-01 MED ORDER — EMPAGLIFLOZIN 25 MG PO TABS
25 MG | ORAL_TABLET | Freq: Every day | ORAL | 1 refills | Status: AC
Start: 2023-04-01 — End: 2023-10-08

## 2023-04-01 MED FILL — FENTANYL CITRATE (PF) 100 MCG/2ML IJ SOLN: 100 MCG/2ML | INTRAMUSCULAR | Qty: 2

## 2023-04-01 MED FILL — DIPHENHYDRAMINE HCL 50 MG/ML IJ SOLN: 50 MG/ML | INTRAMUSCULAR | Qty: 1

## 2023-04-01 MED FILL — MIDAZOLAM HCL 2 MG/2ML IJ SOLN: 2 MG/ML | INTRAMUSCULAR | Qty: 2

## 2023-04-01 NOTE — Pre Sedation (Signed)
Sedation Pre-Procedure Note    Patient Name: Thomas Walton   Date of Birth:[birth year]  Room/Bed: Room/bed info not found  Medical Record Number: [id number]  Date: 04/01/2023   Time: 1:36 PM       Indication:  thyroid biopsy    Consent: I have discussed with the patient and/or the patient representative the indication, alternatives, and the possible risks and/or complications of the planned procedure and the anesthesia methods. The patient and/or patient representative appear to understand and agree to proceed.    Vital Signs:   Vitals:    04/01/23 1331   BP: (!) 140/84   Pulse:    Resp: 16   SpO2: 98%       Past Medical History:   has a past medical history of Asthma, Diabetes ([place]), HLD (hyperlipidemia), HTN (hypertension), and PAF (paroxysmal atrial fibrillation) ([place]).    Past Surgical History:   has a past surgical history that includes Cardiac catheterization (12/12/2020) and Abdomen surgery (Right, 05/22/2022).    Medications:   Scheduled Meds:   Continuous Infusions:   PRN Meds: midazolam, fentanNYL, diphenhydrAMINE  Home Meds:   Prior to Admission medications    Medication Sig Start Date End Date Taking? Authorizing Provider   metoprolol succinate (TOPROL XL) 50 MG extended release tablet Take 1 tablet by mouth in the morning and at bedtime 02/13/23   Gillian Shields, MD   atorvastatin (LIPITOR) 40 MG tablet Take 1 tablet by mouth at bedtime 01/25/23   Gillian Shields, MD   metFORMIN (GLUCOPHAGE-XR) 500 MG extended release tablet Take 1 tablet by mouth daily 01/14/23 07/13/23  Gillian Shields, MD   losartan (COZAAR) 50 MG tablet Take 1 tablet by mouth daily 12/13/22   Gillian Shields, MD   rivaroxaban Alveda Reasons) 20 MG TABS tablet Take 1 tablet by mouth daily 10/10/22   Gillian Shields, MD   empagliflozin (JARDIANCE) 25 MG tablet Take 1 tablet by mouth daily 06/13/22   Gillian Shields, MD     Coumadin Use Last 7 Days:  no  Antiplatelet drug therapy use last 7 days: no  Other anticoagulant use last 7 days: no  Additional Medication  Information:  N/A      Pre-Sedation Documentation and Exam:   I have reviewed the patient's history and review of systems.    Mallampati Airway Assessment:  Mallampati Class I - (soft palate, fauces, uvula & anterior/posterior tonsillar pillars are visible)    Prior History of Anesthesia Complications:   none    ASA Classification:  Class 3 - A patient with severe systemic disease that limits activity but is not incapacitating    Sedation/ Anesthesia Plan:   intravenous sedation    Medications Planned:   fentanyl intravenously    Patient is an appropriate candidate for plan of sedation: yes    Electronically signed by Merrilee Jansky, MD on 04/01/2023 at 1:36 PM

## 2023-04-01 NOTE — Brief Op Note (Signed)
Brief Postoperative Note      Patient: Thomas Walton  Date of Birth: 04-Apr-1971  MRN: [id number]    Date of Procedure: 04/01/2023  Right thyroid nodule    Post-Op Diagnosis: Same       US guided thyroid nodule biopsy    M. Tour manager:  * No surgical staff found *    Anesthesia: * Moderate sedation    Estimated Blood Loss (mL): Minimal    Complications: None    Specimens:   Right thyroid nodule    Implants:  * No implants in log *      Drains: * No LDAs found *    Findings: Four 25 G cores      Electronically signed by Merrilee Jansky, MD on 04/01/2023 at 1:37 PM

## 2023-04-02 LAB — NON-GYN CYTOLOGY

## 2023-04-05 ENCOUNTER — Encounter: Payer: PRIVATE HEALTH INSURANCE | Primary: Family Medicine

## 2023-04-11 ENCOUNTER — Telehealth

## 2023-04-11 NOTE — Telephone Encounter (Addendum)
[facility]  ORTHOPAEDIC SURGERY SCHEDULING NOTE    Patient wishes to proceed after surgery discussion with Dr. Letitia Libra.     Surgical education was discussed and patient was given the surgical handout. Pre/post-op appointments were made as needed. Patient is also aware to obtain any medical clearances prior to surgery, if requested. Notified that pre-admission testing will also be reaching out for education and instructions on arrival time prior to procedure.     Patient is to obtain clearance(s) from: MED[facility] CARDI[facility]     Authorization submitted to AET[employer]    Status: PENDING#: [id number]    Surgery: LEFT KNEE MAKO ROBOTIC ASSISTED TOTAL JOINT   OR DATE: 5/28 [facility]   Vendor: Victor[facility] Dike  Block: PNB  CPT: 81191  DX: M17.12   Special Needs (if applicable):        Scheduled: [facility] OR Staff

## 2023-04-12 ENCOUNTER — Ambulatory Visit: Admit: 2023-04-12 | Discharge: 2023-04-12 | Payer: PRIVATE HEALTH INSURANCE | Primary: Family Medicine

## 2023-04-12 DIAGNOSIS — I4891 Unspecified atrial fibrillation: Secondary | ICD-10-CM

## 2023-04-12 NOTE — Telephone Encounter (Signed)
Patient is having a left knee total joint arthroplasty      Scheduled 05-28-23    Please advise if the patient may cleared from a cardiac stand point       Procedure scheduled 05-28-23    Last seen by JK 10-11-22     Scheduled today for  office visit

## 2023-04-12 NOTE — Progress Notes (Signed)
OFFICE VISIT    NAME: Thomas Walton     DATE OF BIRTH: 1971-12-04   AGE: 52 y.o.   MEDICAL RECORD NUMBER: [id number]       CONSULTATION INFORMATION:   DATE OF CLINIC CONSULTATION: 04/12/2023   PRINCIPLE DIAGNOSIS / reason for visit: Preop evaluation prior to left knee surgery    CARE TEAM MEMBERS    PCP : Vickey Huger, MD     PRIMARY CARDIOLOGIST: Former Dr. Joen Laura   REFERRING PROVIDER: No ref. provider found     HISTORY OF PRESENT ILLNESS:   Thomas Walton is a 52 y.o. male who presents for preop evaluation prior to left knee surgery. Past medical history of paroxysmal atrial fibrillation (on Xarelto), DM2, asthma, obesity, shellfish allergy (anaphylaxis), normal coronary angiogram 11/2020        -TTE 09/28/2022:  Summary   Left ventricular internal dimensions were normal in diastole and systole.   Mild left ventricular concentric hypertrophy noted.   No regional wall motion abnormalities seen.   Normal left ventricular ejection fraction.    -ECG 04/12/2023: Normal sinus rhythm, LVH, LAFB, nonspecific ST-T wave changes.      Patient denies having any chest pain, shortness of breath, dyspnea on exertion, lower extremity edema, palpitations or dizziness.  No LOC.  He is able to do his daily life activities without problem.  He is able to climb more than 2 flights of stairs without symptoms.       PAST HISTORY:   Past Medical History:   Diagnosis Date    Asthma     Diabetes ([facility])     HLD (hyperlipidemia)     HTN (hypertension)     PAF (paroxysmal atrial fibrillation) ([facility])        Past Surgical History:   Procedure Laterality Date    ABDOMEN SURGERY Right 05/22/2022    EXCISION RIGHT CHEST WALL CYST performed by Lorene Dy, MD at [facility] OR    CARDIAC CATHETERIZATION  12/12/2020      Family History   Problem Relation Age of Onset    Heart Attack Father       Social History     Socioeconomic History    Marital status: Radiation protection practitioner     Spouse name: Not on file    Number of children: Not on file    Years of education: Not  on file    Highest education level: Not on file   Occupational History    Not on file   Tobacco Use    Smoking status: Never    Smokeless tobacco: Never   Vaping Use    Vaping Use: Never used   Substance and Sexual Activity    Alcohol use: Yes     Comment: rare    Drug use: Never    Sexual activity: Not on file   Other Topics Concern    Not on file   Social History Narrative    Drinks 1 cup of coffee/week     Social Determinants of Health     Financial Resource Strain: Low Risk  (06/13/2022)    Overall Financial Resource Strain (CARDIA)     Difficulty of Paying Living Expenses: Not hard at all   Food Insecurity: Not on file (06/13/2022)   Transportation Needs: Unknown (06/13/2022)    PRAPARE - Therapist, art (Medical): Not on file     Lack of Transportation (Non-Medical): No   Physical Activity: Sufficiently Active (03/24/2023)  Exercise Vital Sign     Days of Exercise per Week: 5 days     Minutes of Exercise per Session: 60 min   Stress: Stress Concern Present (04/11/2021)    [employer] of Occupational Health - Occupational Stress Questionnaire     Feeling of Stress : Very much   Social Connections: Moderately Integrated (04/11/2021)    Social Connection and Isolation Panel [NHANES]     Frequency of Communication with Friends and Family: More than three times a week     Frequency of Social Gatherings with Friends and Family: More than three times a week     Attends Religious Services: Never     Database administrator or Organizations: Yes     Attends Engineer, structural: More than 4 times per year     Marital Status: Married   Catering manager Violence: Not At Risk (07/21/2021)    Humiliation, Afraid, Rape, and Kick questionnaire     Fear of Current or Ex-Partner: No     Emotionally Abused: No     Physically Abused: No     Sexually Abused: No   Housing Stability: Unknown (06/13/2022)    Housing Stability Vital Sign     Unable to Pay for Housing in the Last Year: Not on file      Number of Places Lived in the Last Year: Not on file     Unstable Housing in the Last Year: No      Allergies   Allergen Reactions    Shellfish-Derived Products Anaphylaxis     Crab, lobster, shrimp, etc.           Current Outpatient Medications:     empagliflozin (JARDIANCE) 25 MG tablet, Take 1 tablet by mouth daily, Disp: 90 tablet, Rfl: 1    metoprolol succinate (TOPROL XL) 50 MG extended release tablet, Take 1 tablet by mouth in the morning and at bedtime, Disp: 180 tablet, Rfl: 3    atorvastatin (LIPITOR) 40 MG tablet, Take 1 tablet by mouth at bedtime, Disp: 90 tablet, Rfl: 3    metFORMIN (GLUCOPHAGE-XR) 500 MG extended release tablet, Take 1 tablet by mouth daily, Disp: 90 tablet, Rfl: 1    losartan (COZAAR) 50 MG tablet, Take 1 tablet by mouth daily, Disp: 90 tablet, Rfl: 3    rivaroxaban (XARELTO) 20 MG TABS tablet, Take 1 tablet by mouth daily, Disp: 90 tablet, Rfl: 3      REVIEW OF SYSTEMS:   Review of Systems   Constitutional:  Negative for chills, fatigue and fever.   HENT:  Negative for nosebleeds.    Respiratory:  Negative for cough, chest tightness and shortness of breath.    Cardiovascular:  Negative for chest pain, palpitations and leg swelling.   Gastrointestinal:  Negative for abdominal pain, blood in stool, nausea and vomiting.   Genitourinary:  Negative for hematuria.   Musculoskeletal:  Negative for back pain and myalgias.   Skin: Negative.    Neurological:  Negative for dizziness, syncope, speech difficulty, weakness and light-headedness.   Hematological:  Does not bruise/bleed easily.   Psychiatric/Behavioral: Negative.            PHYSICAL EXAMINATION:   VITAL SIGNS: There were no vitals taken for this visit.    GENERAL: NAD   HEAD: Normocephalic, atraumatic.   NECK: No JVD. No carotid bruits. No neck masses.    CARDIOVASCULAR: Normal rate, regular rhythm, normal S1, S2, no MRG    LUNGS: Clear to auscultation  bilaterally, no wheezing.    ABDOMEN: Abdomen is soft and not distended. No  tenderness.    EXTREMITIES: There is no pedal edema.   MUSCULOSKELETAL: No joint swelling. Normal range of motion    SKIN: Skin is moist. No ulcers or lesions.   NEUROLOGICAL: No evidence of obvious neurological deficits.    PSYCHOLOGICAL: Patient is in normal mood.          LABORATORY DATA:   Lab Results   Component Value Date/Time    WBC 3.8 03/14/2023 10:37 AM    HGB 14.8 03/14/2023 10:37 AM    HCT 45.5 03/14/2023 10:37 AM    PLT 236 03/14/2023 10:37 AM      Lab Results   Component Value Date/Time    NA 142 03/14/2023 10:37 AM    K 3.8 03/14/2023 10:37 AM    K 4.5 12/15/2021 08:34 PM    CO2 27 03/14/2023 10:37 AM    BUN 11 03/14/2023 10:37 AM    CREATININE 1.1 03/14/2023 10:37 AM    MG 2.0 12/05/2020 09:45 PM      No components found for: "HGBA1C"   Lab Results   Component Value Date/Time    TSH 0.91 03/14/2023 10:37 AM      Lab Results   Component Value Date/Time    LDLCALC 120 12/06/2020 11:26 AM                ASSESSMENT & PLAN:     52 y.o. male who presents for preop evaluation prior to left knee surgery. Past medical history of paroxysmal atrial fibrillation (on Xarelto), DM2, asthma, obesity, shellfish allergy (anaphylaxis), normal coronary angiogram 11/2020    Paroxysmal A.fib:  Continue Xarelto  Continue Metoprolol XL 50 mg daily  HLD:  Continue Atorvastatin 40 mg daily  DM:  On Metformin  Continue Losartan 50 mg daily  Preop evaluation:  Patient is asymptomatic from cardiac stand point  No further cardiac testing prior to knee surgery, hold Xarelto 3 days prior to surgery day and resume after surgery as deemed suitable.      Roddie Mc, MD  Interventional cardiology / structural heart disease    [facility]  [address] [place] [address]  [phone]                                                              [phone]

## 2023-04-26 NOTE — Discharge Instructions (Signed)
DISCHARGE INSTRUCTIONS FOR TOTAL KNEE REPLACEMENT        Prevent blood clots - you are at risk post operatively for DVT (Deep vein    thrombosis and / or PE (Pulmonary Embolism)       Continue antiembolic stockings (TED hose) for 4 weeks from date of surgery.   Remove stockings every eight hours.   Check skin for redness or any breakdowns on heels and over outer ankle bones.   Do not roll stockings down or fold over (this may cause a band of constriction    interfering with circulation and increasing your risk of a blood clot forming or a    skin breakdown.   If you have not been wearing your stockings and notice increased swelling or    excessive swelling in your extremity then: Lie down and elevate legs for 20-30   minutes.  Apply stockings.  If swelling does not improve, call office.   REMEMBER: Mild to moderate swelling of the operative limb is expected    for some time after surgery.     Take frequent walks around  your home.   Be careful outdoors- watch for uneven ground and pavement, curbs and holes.   Gradually increase your activity to prevent fatigue.   When at rest (sitting in a chair or lying down,resting) continue circulation exercises at least every hour - foot flaps, ankle rolls, quad and glut sets.      You will continue one of the following blood thinners at home.   Lovenox -  An injection once or twice a day if you have a history of blood clots,    cancer, stomach ulcers or gastrointestinal bleeding.   Aspirin - 81 mg twice daily if no history of the above ailments.   Coumadin - if held before surgery, your family doctor will be responsible for   managing and ordering this medication upon your discharge.    Incision Care:   You may shower - Your dressing is water proof.  You can shower with your dressing on.  After one week, remove your dressing and leave your incision open to air.   REMEMBER to let water roll over incision. Incision line is    healing and tender - protect from HOT water. No need  to wash incision with soap   and water.  Pat incision dry with clean hand towel or let air dry.   DO NOT take baths, go in swimming pools/hot tubs/lakes, or submerge your operative leg under water until you are cleared by Dr. Johnston.      Do not apply creams, liniments, lotions or ointments directly on incision line.   If incision is draining, keep covered with sterile gauze.  Change dressing daily and each time you shower.    Do not touch incision line with your fingers or scratch incision with your   fingernails (your fingernails harbor germs and bacteria).   Do not allow pets near your incision - do not allow pets to smell or lick incision.    Weight Bearing:   You can be weight bearing as tolerated on your operative leg.  Use a walker/cane as needed.        Signs and symptoms to report to your doctor:     Persistent drainage from the incision.   Increased redness or swelling of the incision or operative extremity.   Increased or excessive pain at the incision site or in the thigh or calf.   Fever   over 101 degrees with chills (take your temperature at home and   Record).    Go to Emergency Room if you experience any of the following:     Chest pain or tightness   Shortness of breath or difficulty breathing   Anxiety or feeling of impending doom      Note: The above are signs and symptoms of a blood clot in your    lungs.  Although this is rare, it can occur.  You must be evaluated in the   Emergency Room.    Rehabilitation:   Continue exercises at home as instructed by the Physical and Occupational   Therapists.   Continue Hip / Knee Precautions until cleared by Dr. Johnston   Begin a formal Outpatient Physical Therapy program as soon as you are able.    First post op visit will include:              Wound check               X-ray of operative joint              Exam by your surgeon               Prescription for Outpatient Physical Therapy     Once you are home:   Call office for appointment at [phone]  for one week.      Jeffrey Johnston, MD  Orthopaedic Surgery

## 2023-05-03 ENCOUNTER — Encounter

## 2023-05-07 ENCOUNTER — Inpatient Hospital Stay: Admit: 2023-05-07 | Payer: PRIVATE HEALTH INSURANCE | Primary: Family Medicine

## 2023-05-07 DIAGNOSIS — M1712 Unilateral primary osteoarthritis, left knee: Secondary | ICD-10-CM

## 2023-05-07 NOTE — Progress Notes (Signed)
Having left knee surgery  Hip fov 31.8  Knee fov 25  Ankle fov 34

## 2023-05-09 ENCOUNTER — Encounter

## 2023-05-09 MED ORDER — METFORMIN HCL ER 500 MG PO TB24
500 MG | ORAL_TABLET | Freq: Every day | ORAL | 1 refills | Status: AC
Start: 2023-05-09 — End: 2023-11-06

## 2023-05-09 NOTE — Telephone Encounter (Signed)
Advance refill approval faxed to office for this medication.

## 2023-05-09 NOTE — Progress Notes (Signed)
Recall letter sent to patient to schedule follow up with Dr. Vaughan Basta.

## 2023-05-13 ENCOUNTER — Ambulatory Visit
Admit: 2023-05-13 | Discharge: 2023-05-13 | Payer: PRIVATE HEALTH INSURANCE | Attending: Facial Plastic Surgery | Primary: Family Medicine

## 2023-05-13 DIAGNOSIS — E041 Nontoxic single thyroid nodule: Secondary | ICD-10-CM

## 2023-05-13 NOTE — Patient Instructions (Signed)
SURGERY:__8___/__15___/__24___    FASTING RECOMMENDATIONS:  Ingested material minimum fasting period:  Clear Liquids- 2 hours (examples of clear liquids include water, fruit juices without pulp, carbonated beverages, clear tea, and black coffee)  Breast Milk- 4 hours   Infant Formula- 6 Hours   Nonhuman milk- 6 hours (Since nonhuman milk is like solids in gastric emptying time, the amount ingested must be considered when determining an appropriate fasting period)  Light meal- 6 hours (a light meal typically consist of toast and clear liquids. Meals that include fried or fatty foods or meat may prolong gastric emptying time. Additional fasting time (e.g. 8 or more hours) may be needed in these cases. Both the amount and type of foods ingested must be considered when determining an appropriate fasting period. Exceptions are ERAS protocol surgeries.)    DO NOT TAKE ANY ASPIRIN PRODUCTS 7 days prior to surgery. Tylenol only. No Advil, Motrin, Aleve, or Ibuprofen. IF YOU ARE ON BLOOD THINNERS (PLAVIX, COUMADIN, ELIQUIS ETC) THESE WILL ALSO NEED TO BE HELD.    Special administration instructions related to diabetic medications include: GLP-1 agonist medications (ex. Ozempic and Trulicity) are to adhere to guidelines as follows:   Glucagon-like-peptide-1 (GLP-1) agonist medication:  Hold GLP-1 agonist beginning 1 day prior to the day of surgery for those who date the medication daily. (Ex. Sx scheduled Wednesday-- last dose of GLP-1 agonist on Monday)  Hold GLP-1 agonist one week prior  to the procedure/surgery for patients who take the medications weekly.  Sodium Glucose Co-Transporter 2 (SGLT2) inhibitors must be discontinued 3-4 days prior to surgery. [facility] [bexaglifloxin]; Invokana [canagliflozin] ; Marcelline Deist [dapagliflozin]; Jardiance [empagliflozin]; Steglatro [ertugliflozin]).    Any illegal drugs in your system (including Marijuana even if legally prescribed) will result in your surgery being cancelled. Please  be sure to check with our office or the hospital on time frame for the drugs to be out of your system.    SHOULD YOUR INSURANCE CHANGE AT ANY TIME YOU MUST CONTACT OUR OFFICE. FAILURE TO DO SO MAY RESULT IN YOUR SURGERY BEING RESCHEDULED OR YOU MAY BE CHARGED AS SELF-PAY.    Due to the high demand for surgery at our practice, if you cancel or reschedule your surgery two (2) times we may not reschedule you.    If you need FMLA or Short Term Disability paperwork completed for your surgery, please complete your portion, ensure your name and date of birth are on them and fax them to [phone] asap. Paperwork can take up to 2 weeks to be completed.    If you have any questions or concerns regarding your surgery, please contact the Surgery Scheduler-Apolonia Ellwood [phone].    If you need medical clearance, you are responsible to contact your physician(s) to schedule an appointment for clearance. If clearance is not completed within 30 days of your surgery it may be cancelled. Our office will fax the appropriate forms that need to be completed to your physician(s). (Dr Christella Hartigan and Dr Jacqulyn Cane)      The location of your surgery will call you the day prior to your surgery date to let you know what time you have to be there and any other details. (they usually don't call until late afternoon- early evening.)- IF YOU HAVE QUESTIONS REGARDING THE TIME OF YOUR SURGERY, PLEASE CALL THE FACILITY YOU ARE SCHEDULED AT.           [facility], [address] [place], [place] will call you a couple days prior to  surgery and give you further instructions, if you have any questions, you can reach them at [phone] (per Pre-Admission testing, EKG is required for all patients age 67+, have a diagnosis of hypertension, diabetes, or on dialysis).

## 2023-05-13 NOTE — Progress Notes (Signed)
[facility]  Dr. Lorine Bears. Miracle Mongillo, D.O. Ms.Ed.  New Consult       Patient Name:  Thomas Walton  DOB:  1971/10/09     CHIEF C/O:    Chief Complaint   Patient presents with    New Patient     Patient presents for FNA of thyroid results.        HISTORY OBTAINED FROM:  patient    HISTORY OF PRESENT ILLNESS:       Trayvin is a 52 y.o. year old male, here today for thyroid review. US showed 6cm thyroid nodule. First noticed 6 months ago. Growing. Denies dysphagia and shortness of breath. History of afib on Xarelto. Pt does state he has an upcoming knee replacement surgery in a month.       Past Medical History:   Diagnosis Date    Asthma     Diabetes (HCC)     HLD (hyperlipidemia)     HTN (hypertension)     PAF (paroxysmal atrial fibrillation) (HCC)      Past Surgical History:   Procedure Laterality Date    ABDOMEN SURGERY Right 05/22/2022    EXCISION RIGHT CHEST WALL CYST performed by Lorene Dy, MD at [facility] OR    CARDIAC CATHETERIZATION  12/12/2020       Current Outpatient Medications:     metFORMIN (GLUCOPHAGE-XR) 500 MG extended release tablet, Take 1 tablet by mouth daily, Disp: 90 tablet, Rfl: 1    empagliflozin (JARDIANCE) 25 MG tablet, Take 1 tablet by mouth daily, Disp: 90 tablet, Rfl: 1    metoprolol succinate (TOPROL XL) 50 MG extended release tablet, Take 1 tablet by mouth in the morning and at bedtime, Disp: 180 tablet, Rfl: 3    atorvastatin (LIPITOR) 40 MG tablet, Take 1 tablet by mouth at bedtime, Disp: 90 tablet, Rfl: 3    losartan (COZAAR) 50 MG tablet, Take 1 tablet by mouth daily, Disp: 90 tablet, Rfl: 3    rivaroxaban (XARELTO) 20 MG TABS tablet, Take 1 tablet by mouth daily, Disp: 90 tablet, Rfl: 3  Shellfish-derived products  Social History     Tobacco Use    Smoking status: Never    Smokeless tobacco: Never   Vaping Use    Vaping Use: Never used   Substance Use Topics    Alcohol use: Yes     Comment: rare    Drug use: Never     Family History   Problem Relation Age of Onset    Heart  Attack Father        Review of Systems   Constitutional:  Negative for activity change, fatigue and fever.   HENT:  Negative for congestion, ear discharge, ear pain, hearing loss, nosebleeds, postnasal drip, rhinorrhea, sinus pressure, sinus pain, sore throat, tinnitus, trouble swallowing and voice change.    Respiratory:  Negative for cough, choking, wheezing and stridor.    Cardiovascular:  Negative for chest pain.   Gastrointestinal: Negative.    Genitourinary: Negative.    Skin:  Negative for color change and rash.   Neurological:  Negative for speech difficulty, light-headedness, numbness and headaches.   Hematological:  Negative for adenopathy.   Psychiatric/Behavioral:  Negative for behavioral problems.        Ht 1.905 m (6\' 3" )   Wt 132.5 kg (292 lb)   BMI 36.50 kg/m   Physical Exam  Constitutional:       Appearance: Normal appearance. He is normal weight.   HENT:  Head: Normocephalic and atraumatic.      Right Ear: Tympanic membrane, ear canal and external ear normal. No drainage.      Left Ear: Tympanic membrane, ear canal and external ear normal. No drainage.      Nose: Nose normal. No nasal deformity or septal deviation.      Mouth/Throat:      Mouth: Mucous membranes are moist.   Eyes:      General: Lids are normal. Vision grossly intact.      Extraocular Movements: Extraocular movements intact.      Conjunctiva/sclera: Conjunctivae normal.      Pupils: Pupils are equal, round, and reactive to light.   Neck:      Comments: Large palpable thyroid on right  Cardiovascular:      Rate and Rhythm: Normal rate.   Pulmonary:      Effort: Pulmonary effort is normal.   Musculoskeletal:         General: Normal range of motion.      Cervical back: Normal range of motion.   Lymphadenopathy:      Cervical: No cervical adenopathy.   Skin:     Capillary Refill: Capillary refill takes less than 2 seconds.   Neurological:      Mental Status: He is alert.   Psychiatric:         Mood and Affect: Mood normal.              Path Number: [id number]    INTERPRETATION    THYROID, FINE NEEDLE ASPIRATION, RIGHT MID:  Satisfactory for evaluation.  NEGATIVE FOR HIGH GRADE UROTHELIAL CARCINOMA.  Bethesda System Category 2  Benign follicular cells, Hurthle cells, rare macrophages and some  colloid  Cellblock is similar.    Comment: Intradepartmental consultation is obtained.          IMPRESSION/PLAN:  Pt seen and examined for large thyroid nodule. FNA reviewed negative. Discussed option of hemithyroidectomy. Will likely proceed after knee replacement. Pt will schedule hemithyroidectomy and will follow up 1 week. TSH reviewed WNL.  After. Pt would have to be off xarelto and will need clearance from his cardiologist.     Dr. Almyra Brace. Takeysha Bonk D.O. Ms. Renae Fickle.  Otolaryngology Facial Plastic Surgery  Program Director:[facility]  Associate Clinical Professor:  Edd Fabian, NEOMED  [facility]          Aidenn Arth.  October 11, 1971      I have discussed the case, including pertinent history and exam findings with the resident. I have seen and examined the patient and the key elements of the encounter have been performed by me.  I agree with the assessment, plan and orders as documented by the resident.      Patient here for follow up of medical problems.         Remainder of medical problems as per resident note.      Almyra Brace Betzabe Bevans, DO  05/17/23

## 2023-05-14 NOTE — Care Coordination-Inpatient (Signed)
Patient's fiance answered and stated to call him tomorrow AM at [phone].

## 2023-05-16 NOTE — Care Coordination-Inpatient (Signed)
RETAIN Initial Recruitment Screening Call:    Hello, my name is [employer]. 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  Am I speaking with Thomas Walton?   I am working with Dr. Stasia Cavalier office. I see that you had a recent appointment.      Dr. Letitia Libra is participating in the RETAIN study for patients who have sustained an injury, illness or condition outside of work that affects their job performance.  Your decision to participate is completely voluntary and will have no impact on the medical care you receive. However, if you are interested and end up enrolling in the study, you will receive a $100 gift card for participating.     Would you like to hear more information about the study?    Research has shown that disability, even short term, can negatively impact health, wages, and job status. In this study, we are looking for ways to improve return to work outcomes.  If you choose to participate, you will be asked questions about your (injury/illness/condition), recovery, employment status. If you are randomized as a treatment patient, you will receive one-on-one care from a RETAIN nurse and have access to a team of social workers to assist with your recovery.      Are you interested in participating?    Great! I'm going to ask you a few questions to see if you are eligible:    Can you confirm your date of birth?  01/31/1971 52 y.o.   Are they between 18 and 65? [x]  YES    []   NO     Have you applied for or are you currently receiving Social Security Disability Income?    Yes- disqualified []  YES    [x]   NO                Are you currently employed?  [x]  YES    []   NO   If not employed are you seeking employment?  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  Yes- disqualified[]  YES    [x]    NO    Could you tell me about your injury, illness, or condition? Left TKA     (Confirm within diagnostic criteria)[x]  YES    []   NO    When did your injury, illness or condition occur? (mm/dd/yyyy) 05/28/2023   (Confirm timeframe falls within eligibility criteria)  [x]  YES    []   NO                                               Is your primary injury/illness/condition caused, even in part, by something that happened at work?  []  YES     [x]   NO    IF YES to above  Is the primary injury/illness/condition part of a Workers' Compensation claim?    []   YES    [x]   NO    Does your recent injury, illness or condition interfere with your ability to perform normal work-related activities or are you making any modifications at work?  [x]  YES    []   NO     Have you  had to call off work, been written off from work or do you feel you may have to call off work at some point in the future?   [] No- disqualified [x]  YES    If answers meet those designated by bolding, then patient is eligible. Proceed.      As a reminder, participation in this study is completely voluntary. I will email you the consent packet now. Could you please confirm your email address is [email or web address]?     Patient has email address Thomas Walton, you should receive the document in your email inbox shortly. Please read over the consent. I would like to schedule a call for you with one of our nurses to review the documents with you and get you enrolled. After you're enrolled, we will send your gift card.   Is the morning or afternoon better for [place] to give you a call? (encourage a time frame)   Is there a certain day of the week that works best for you?  05/20/2023 @ 4:30 PM requested time    The Nurse who will call you is named Candice and her phone number is [phone]. I will place you on her schedule for the day and time we just talked about. If you have any questions in the meantime feel free to reach out to her. I am sending you an email right now to  review before this appointment. Can you confirm you have received it?      If you have any questions about the consent forms or about the study, the nurse will answer them during your enrollment call. Please wait until this call to sign the consent.    Do you have any questions for me at this time?

## 2023-05-17 ENCOUNTER — Encounter

## 2023-05-17 NOTE — Telephone Encounter (Signed)
Prior Authorization Form:      DEMOGRAPHICS:                     Patient Name:  Dameian Lozo.  Patient DOB:  07-Jul-1971            Insurance:  Payor: Monia Pouch / Plan: AET[employer] - OPEN ACCESS (HMO) / Product Type: *No Product type* /   Insurance ID Number:    Payer/Plan Subscr DOB Sex Relation Sub. Ins. ID Effective Group Num   1. HIROTO, KEMNA [birth year] Male Self [id number] 10/31/20 [id number]                                   [address] [address]         DIAGNOSIS & PROCEDURE:                       Procedure/Operation: HEMITHRYOIDECTOMY           CPT Code: 52841    Diagnosis:  THYROID NODULE    ICD10 Code: E04.1    Location:  [facility]    Surgeon:  Celine Mans INFORMATION:                          Date: 08/15/23    Time: N/A              Anesthesia:  General                                                       Status:  Outpatient        Special Comments:  NIM       Electronically signed by Roxy Horseman, MA on 05/17/2023 at 11:22 AM

## 2023-05-20 ENCOUNTER — Inpatient Hospital Stay: Admit: 2023-05-20 | Payer: PRIVATE HEALTH INSURANCE | Primary: Family Medicine

## 2023-05-20 DIAGNOSIS — Z01818 Encounter for other preprocedural examination: Secondary | ICD-10-CM

## 2023-05-20 LAB — CBC WITH AUTO DIFFERENTIAL
Basophils %: 1 % (ref 0.0–2.0)
Basophils Absolute: 0.04 10*3/uL (ref 0.00–0.20)
Eosinophils %: 2 % (ref 0–6)
Eosinophils Absolute: 0.07 10*3/uL (ref 0.05–0.50)
Hematocrit: 44.5 % (ref 37.0–54.0)
Hemoglobin: 13.7 g/dL (ref 12.5–16.5)
Immature Granulocytes %: 0 % (ref 0.0–5.0)
Immature Granulocytes Absolute: <0.03 10*3/uL (ref 0.00–0.58)
Lymphocytes %: 43 % — ABNORMAL HIGH (ref 20.0–42.0)
Lymphocytes Absolute: 1.51 10*3/uL (ref 1.50–4.00)
MCH: 27.4 pg (ref 26.0–35.0)
MCHC: 30.8 g/dL — ABNORMAL LOW (ref 32.0–34.5)
MCV: 89 fL (ref 80.0–99.9)
MPV: 8.5 fL (ref 7.0–12.0)
Monocytes %: 9 % (ref 2.0–12.0)
Monocytes Absolute: 0.31 10*3/uL (ref 0.10–0.95)
Neutrophils %: 45 % (ref 43.0–80.0)
Neutrophils Absolute: 1.56 10*3/uL — ABNORMAL LOW (ref 1.80–7.30)
Platelets: 226 10*3/uL (ref 130–450)
RBC: 5 m/uL (ref 3.80–5.80)
RDW: 14.2 % (ref 11.5–15.0)
WBC: 3.5 10*3/uL — ABNORMAL LOW (ref 4.5–11.5)

## 2023-05-20 LAB — BASIC METABOLIC PANEL
Anion Gap: 7 mmol/L (ref 7–16)
BUN: 13 mg/dL (ref 6–20)
CO2: 26 mmol/L (ref 22–29)
Calcium: 8.9 mg/dL (ref 8.6–10.2)
Chloride: 107 mmol/L (ref 98–107)
Creatinine: 1.2 mg/dL (ref 0.70–1.20)
Est, Glom Filt Rate: 73 mL/min/{1.73_m2} (ref >60)
Glucose: 129 mg/dL — ABNORMAL HIGH (ref 74–99)
Potassium: 4.4 mmol/L (ref 3.5–5.0)
Sodium: 140 mmol/L (ref 132–146)

## 2023-05-20 LAB — PREALBUMIN: Prealbumin: 20 mg/dL (ref 20.0–40.0)

## 2023-05-20 NOTE — Progress Notes (Signed)
During PAT visit, pt states he has not seen PCP for Medical clearance for DOS 05/28/23. Instructed pt to make appt ASAP.the patient  verbalzies understanding    Anesthesiologist to see pt DOS per Dr. Deniece Ree

## 2023-05-20 NOTE — Progress Notes (Signed)
[facility] PRE-ADMISSION TESTING INSTRUCTIONS    The Preadmission Testing patient is instructed accordingly using the following criteria (check applicable):    ARRIVAL INSTRUCTIONS:  [x]  Parking the day of Surgery is located in the Main Entrance lot.  Upon entering the door, make an immediate right to the surgery reception desk    [x]  Bring photo ID and insurance card    []  Bring in a copy of Living will or Durable Power of attorney papers.    [x]  Please be sure to arrange for responsible adult to provide transportation to and from the hospital    [x]  Please arrange for responsible adult to be with you for the 24 hour period post procedure due to having anesthesia    [x]  If you awake am of surgery not feeling well or have temperature >100 please call [phone]    GENERAL INSTRUCTIONS:    [x]  Follow instructions for hydration that have been provided to you at your Pre-Admission Visit. Solid food until midnight then clear liquids. No gum, candy or mints.    [x]  You may brush your teeth, but do not swallow any water    [x]  Take medications as instructed with 1-2 oz of water. METOPROLOL, ALBUTEROL INHALER IF NEEDED    [x]  Stop herbal supplements and vitamins 5 days prior to procedure    [x]  Follow preop dosing of blood thinners per physician instructions.  XARELTO 3 DAYS HOLD FOR SURGERY    [x]  No oral diabetic medications after midnight.Marland Kitchen HOLD JARDIANCE 3 DAYS PRIOR TO SURGERY    [x]  If diabetic and have low blood sugar or feel symptomatic, take 1-2oz apple juice only    [x]  Bring inhalers day of surgery    [x]  No tobacco products within 24 hours of surgery     [x]  No alcohol or illegal drug use within 24 hours of surgery.    [x]  Jewelry, body piercing's, eyeglasses, contact lenses and dentures are not permitted into surgery (bring cases)      [x]  You can expect a call the business day prior to procedure to notify you if your arrival time changes    [x]  If you receive a survey after  surgery we would greatly appreciate your comments    [x]  Please notify surgeon if you develop any illness between now and time of surgery (cold, cough, sore throat, fever, nausea, vomiting) or any signs of infections  including skin, wounds, and dental.    [x]   The Outpatient Pharmacy is available to fill your prescription here on your day of surgery, ask your preop nurse for details    [x]  Other instructions:WEAR LOOSE, COMFORTABLE CLOTHING    EDUCATIONAL MATERIALS PROVIDED:    [x]  PAT Preoperative Education Packet/Booklet     [x]  Medication List    [x]  Shower with soap, lather and rinse well, and use CHG wipes provided the evening before surgery as instructed    [x]  Incentive spirometer with instructions

## 2023-05-20 NOTE — Progress Notes (Signed)
CBC results faxed to Dr. Letitia Libra office for review prior to [facility]

## 2023-05-20 NOTE — Telephone Encounter (Signed)
I filled out this patient's preop form, I think it is in the faxing basket, can you make sure it gets over to ENT

## 2023-05-20 NOTE — Telephone Encounter (Signed)
From: Gregary Cromer.  To: Dr. Vickey Huger  Sent: 05/20/2023 8:58 AM EDT  Subject: Surgery    Doc, they need your blessing for my surgery on 5/28 due to me being a diabetic.

## 2023-05-20 NOTE — Telephone Encounter (Signed)
Denial received.     Peer to peer discussion scheduled for tomorrow 5/21 between 10a-4p with the NP.     Lvm for pt to notify.

## 2023-05-21 LAB — CULTURE, MRSA, SCREENING: Culture: NEGATIVE

## 2023-05-21 NOTE — Care Coordination-Inpatient (Signed)
Contact made to discuss RETAIN consent packet information. INSURANCE CX SX HAS TO DO 2 MONTH THERAPY FIRST. WILL CB MONTHLY

## 2023-05-21 NOTE — Telephone Encounter (Signed)
Peer to peer discussion was held with Dr. Aline August and Roselyn Reef. Per Dr. Aline August, even though patient completed all conservative managements and has advanced OA -- he needs to have com pelted formal PT within the last 6 months, last formal PT was completed in 2022, so they will not approve pts surgery.     Spoke with lisa, patients EC and notified her. She expressed understanding, requesting rx for PT to be sent to [facility] in [place].

## 2023-05-21 NOTE — Telephone Encounter (Signed)
Faxed and scanned

## 2023-05-22 ENCOUNTER — Encounter: Payer: PRIVATE HEALTH INSURANCE | Attending: Orthopaedic Surgery | Primary: Family Medicine

## 2023-05-22 DIAGNOSIS — M1712 Unilateral primary osteoarthritis, left knee: Secondary | ICD-10-CM

## 2023-05-28 ENCOUNTER — Inpatient Hospital Stay: Payer: PRIVATE HEALTH INSURANCE

## 2023-06-03 ENCOUNTER — Encounter: Payer: PRIVATE HEALTH INSURANCE | Attending: Orthopaedic Surgery | Primary: Family Medicine

## 2023-06-05 ENCOUNTER — Ambulatory Visit
Admit: 2023-06-05 | Discharge: 2023-06-05 | Payer: PRIVATE HEALTH INSURANCE | Attending: Family Medicine | Primary: Family Medicine

## 2023-06-05 VITALS — BP 138/84 | HR 65 | Temp 95.90000°F | Resp 20 | Ht 75.0 in | Wt 278.0 lb

## 2023-06-05 DIAGNOSIS — I480 Paroxysmal atrial fibrillation: Secondary | ICD-10-CM

## 2023-06-05 DIAGNOSIS — E119 Type 2 diabetes mellitus without complications: Principal | ICD-10-CM

## 2023-06-05 LAB — POCT GLYCOSYLATED HEMOGLOBIN (HGB A1C): Hemoglobin A1C: 7.3 %

## 2023-06-05 NOTE — Progress Notes (Unsigned)
Thomas Walton. (DOB:  04/06/1971) is a 52 y.o. male, established patient follow up , here for evaluation of the following:  No chief complaint on file.      Assessment & Plan   ASSESSMENT/PLAN      {There are no diagnoses linked to this encounter. (Refresh or delete this SmartLink)}  No follow-ups on file.         Subjective   SUBJECTIVE/OBJECTIVE:  HPI  Thomas Walton is here for follow-up on diabetes    He did have surgical procedure from orthopedics on his knee, knee replacement- never had as he has to do more PT.     He plans to have thyroid surgery in August, FNA was negative he had wanted to proceed with hemithyroidectomy due to the size, will need clearance from cardiologist to be off of the Xarelto    Declined preventative screening identified as care gaps unless ordered through this visit    PHQ2/PHQ9      No data recorded     Past Medical History:  has a past medical history of Arthritis, Asthma, Diabetes (HCC), HLD (hyperlipidemia), HTN (hypertension), OSA on CPAP, PAF (paroxysmal atrial fibrillation) (HCC), and Thyroid nodule.   Past Surgical History:  has a past surgical history that includes Cardiac catheterization (12/12/2020) and Abdomen surgery (Right, 05/22/2022).  Social History:  reports that he has never smoked. He has never used smokeless tobacco. He reports current alcohol use. He reports that he does not use drugs.  Family History: family history includes Heart Attack in his father.  Allergies: Shellfish-derived products  Medications:   Current Outpatient Medications   Medication Sig Dispense Refill    vitamin D (ERGOCALCIFEROL) 1.25 MG (50000 UT) CAPS capsule Take 1 capsule by mouth once a week Takes every Monday      albuterol sulfate HFA (VENTOLIN HFA) 108 (90 Base) MCG/ACT inhaler Inhale 2 puffs into the lungs every 6 hours as needed for Wheezing      metFORMIN (GLUCOPHAGE-XR) 500 MG extended release tablet Take 1 tablet by mouth daily 90 tablet 1    empagliflozin (JARDIANCE) 25 MG tablet  Take 1 tablet by mouth daily 90 tablet 1    metoprolol succinate (TOPROL XL) 50 MG extended release tablet Take 1 tablet by mouth in the morning and at bedtime 180 tablet 3    atorvastatin (LIPITOR) 40 MG tablet Take 1 tablet by mouth at bedtime 90 tablet 3    losartan (COZAAR) 50 MG tablet Take 1 tablet by mouth daily 90 tablet 3    rivaroxaban (XARELTO) 20 MG TABS tablet Take 1 tablet by mouth daily 90 tablet 3     No current facility-administered medications for this visit.      Allergies: Shellfish-derived products     Review of Systems   Constitutional:  Negative for activity change, appetite change, fatigue, fever and unexpected weight change.   HENT:  Negative for trouble swallowing.    Eyes:  Negative for visual disturbance.   Respiratory:  Negative for shortness of breath.    Cardiovascular:  Negative for chest pain.   Gastrointestinal:  Negative for abdominal pain.   Musculoskeletal:  Negative for arthralgias.   Neurological:  Negative for weakness, light-headedness and headaches.   Psychiatric/Behavioral:  Negative for confusion and sleep disturbance.    All other systems reviewed and are negative.         Objective   There were no vitals taken for this visit.      Lab  Results   Component Value Date    LABA1C 7.5 03/05/2023    LABA1C 7.0 08/30/2022    LABA1C 7.0 03/20/2022     Lab Results   Component Value Date    CHOL 148 03/14/2023    CHOL 220 (H) 12/06/2020     Lab Results   Component Value Date    TRIG 154 (H) 03/14/2023    TRIG 334 (H) 12/06/2020     Lab Results   Component Value Date    HDL 34 (L) 03/14/2023    HDL 33 12/06/2020     No components found for: "LDLCHOLESTEROL", "LDLCALC"  Lab Results   Component Value Date    VLDL 31 03/14/2023    VLDL 67 12/06/2020     No results found for: "CHOLHDLRATIO"   Creatinine   Date Value Ref Range Status   05/20/2023 1.2 0.70 - 1.20 mg/dL Final   16/10/9603 1.1 0.70 - 1.20 mg/dL Final   54/08/8118 1.2 0.7 - 1.2 mg/dL Final       The 14-NWGN ASCVD risk score  (Arnett DK, et al., 2019) is: 14.9%    Values used to calculate the score:      Age: 59 years      Sex: Male      Is Non-Hispanic African American: Yes      Diabetic: Yes      Tobacco smoker: No      Systolic Blood Pressure: 124 mmHg      Is BP treated: Yes      HDL Cholesterol: 34 mg/dL      Total Cholesterol: 148 mg/dL     Physical Exam  Constitutional:       General: He is not in acute distress.     Appearance: Normal appearance.   HENT:      Head: Normocephalic and atraumatic.      Right Ear: External ear normal.      Left Ear: External ear normal.      Nose: Nose normal.      Mouth/Throat:      Mouth: Mucous membranes are moist.   Eyes:      Extraocular Movements: Extraocular movements intact.      Conjunctiva/sclera: Conjunctivae normal.   Cardiovascular:      Rate and Rhythm: Normal rate and regular rhythm.      Heart sounds: No murmur heard.  Pulmonary:      Effort: Pulmonary effort is normal.      Breath sounds: Normal breath sounds. No wheezing.   Musculoskeletal:         General: Normal range of motion.   Neurological:      General: No focal deficit present.      Mental Status: He is alert.   Psychiatric:         Mood and Affect: Mood normal.         Behavior: Behavior normal.             An electronic signature was used to authenticate this note.    --Vickey Huger, MD       *NOTE: This report was transcribed using voice recognition software. Every effort was made to ensure accuracy; however, inadvertent computerized transcription errors may be present.

## 2023-06-19 NOTE — Care Coordination-Inpatient (Signed)
Contact made to discuss RETAIN consent packet information. SX DATE FOR LEFT TKA HAS NOT BEEN RESCHEDULED

## 2023-07-18 ENCOUNTER — Encounter: Payer: PRIVATE HEALTH INSURANCE | Attending: Adult Health | Primary: Family Medicine

## 2023-07-18 NOTE — Care Coordination-Inpatient (Signed)
Contact made to discuss RETAIN consent packet information. No response and message left Final Informed consent attempt.

## 2023-07-22 ENCOUNTER — Encounter
Admit: 2023-07-22 | Discharge: 2023-07-22 | Payer: PRIVATE HEALTH INSURANCE | Attending: Adult Health | Primary: Family Medicine

## 2023-07-22 DIAGNOSIS — M1712 Unilateral primary osteoarthritis, left knee: Secondary | ICD-10-CM

## 2023-07-22 NOTE — Progress Notes (Signed)
[facility]   ORTHOPAEDIC SURGERY AND SPORTS MEDICINE  DATE OF VISIT: 07/22/23  Follow Up Knee Visit     CHIEF COMPLAINT:   Chief Complaint   Patient presents with    Follow-up     F/u on left knee OA, the calf tightness and pain is getting better       HPI:    Thomas Walton is a 52 y.o. year old male who presented to the office today for follow up of left knee pain, previously evaluated on 03/27/2023.  At that time patient wished to proceed with a left total knee replacement which was unfortunately canceled by his due to his insurance denying the procedure due to lack of recent therapy, despite already completing PT within the last 24 months.  He has recently completed 8 weeks of physical therapy and reports symptoms remain unchanged.  He has failed extensive conservative treatment at this time.  Reports symptoms remain unchanged.     REVIEW OF SYSTEMS:     General: Negative Fever, chills, fatigue   Cardiovascular: Negative chest pain, palpitations  Respiratory: Equal chest expansion, negative shortness of breath   Skin: Negative rash, open wounds  Psych: Normal affect, mood stable  Neurologic: sensation grossly intact in extremities   Musculoskeletal: See HPI      Physical Exam:     No data recorded    General: Alert and oriented x3, no acute distress  Cardiovascular/pulmonary: No labored breathing, peripheral perfusion intact  Musculoskeletal:    Left Knee:    negative swelling/deformity/effusion  Range of motion is 0 to 140.     Patella is stable tracking midline, positive patellofemoral crepitus  There is no laxity with varus/valgus stress at 0 and 30 degrees of flexion.    There is tenderness to palpation along the medial joint line.    There is no tenderness to palpation along the lateral joint line        Controlled Substances Monitoring:      Imaging:  Previous imaging reviewed      Assessment: Left knee osteoarthritis      Plan:   Patient presents to the office today for evaluation of left knee pain, due  to advanced osteoarthritis.  Has recently failed 8 weeks of formal outpatient physical therapy.  He reports symptoms remain unchanged.  For these reasons he is still looking to proceed with surgery in the form of a left Mako robotic assisted total knee arthroplasty.  However he is now currently scheduled for thyroid surgery in early to mid August.  Patient will discuss with ENT when he would be cleared to proceed with total knee replacement.  Patient will call and notify the office for scheduling purposes.        Roselyn Reef, APRN-CNP  Orthopaedic Surgery   07/22/23  9:34 AM

## 2023-08-12 NOTE — Telephone Encounter (Incomplete)
[facility]  ORTHOPAEDIC SURGERY SCHEDULING NOTE    Patient wishes to proceed after surgery discussion with Dr. Letitia Libra.     Surgical education was discussed and patient was given the surgical handout. Pre/post-op appointments were made as needed. Patient is also aware to obtain any medical clearances prior to surgery, if requested. Notified that pre-admission testing will also be reaching out for education and instructions on arrival time prior to procedure.     Patient is to obtain clearance(s) from: ***    Authorization submitted to ***   Status: {Blank single:19197::"Not Required","Pending","Approved"}    Surgery: LEFT KNEE MAKO ROBOTIC ASSISTED TOTAL JOINT   OR DATE: 11/26/23  Vendor: [employer]  Block: PNB  CPT: 21308  DX: M17.12   Special Needs (if applicable): N/A

## 2023-08-12 NOTE — H&P (Signed)
Thomas Walton  Dr. Lorine Bears. Bunevich, D.O. Ms.Ed.  New Consult         Patient Name:  Thomas Walton  DOB:  12-Nov-1971      CHIEF C/O:         Chief Complaint   Patient presents with    New Patient       Patient presents for FNA of thyroid results.          HISTORY OBTAINED FROM:  patient     HISTORY OF PRESENT ILLNESS:       Thomas Walton is a 52 y.o. year old male, here today for thyroid review. US showed 6cm thyroid nodule. First noticed 6 months ago. Growing. Denies dysphagia and shortness of breath. History of afib on Xarelto. Pt does state he has an upcoming knee replacement surgery in a month.         Past Medical History        Past Medical History:   Diagnosis Date    Asthma      Diabetes (HCC)      HLD (hyperlipidemia)      HTN (hypertension)      PAF (paroxysmal atrial fibrillation) (HCC)           Past Surgical History         Past Surgical History:   Procedure Laterality Date    ABDOMEN SURGERY Right 05/22/2022     EXCISION RIGHT CHEST WALL CYST performed by Lorene Dy, MD at [facility] OR    CARDIAC CATHETERIZATION   12/12/2020           Current Medication      Current Outpatient Medications:     metFORMIN (GLUCOPHAGE-XR) 500 MG extended release tablet, Take 1 tablet by mouth daily, Disp: 90 tablet, Rfl: 1    empagliflozin (JARDIANCE) 25 MG tablet, Take 1 tablet by mouth daily, Disp: 90 tablet, Rfl: 1    metoprolol succinate (TOPROL XL) 50 MG extended release tablet, Take 1 tablet by mouth in the morning and at bedtime, Disp: 180 tablet, Rfl: 3    atorvastatin (LIPITOR) 40 MG tablet, Take 1 tablet by mouth at bedtime, Disp: 90 tablet, Rfl: 3    losartan (COZAAR) 50 MG tablet, Take 1 tablet by mouth daily, Disp: 90 tablet, Rfl: 3    rivaroxaban (XARELTO) 20 MG TABS tablet, Take 1 tablet by mouth daily, Disp: 90 tablet, Rfl: 3     Shellfish-derived products  Social History   Social History            Tobacco Use    Smoking status: Never    Smokeless tobacco: Never   Vaping Use    Vaping Use: Never used    Substance Use Topics    Alcohol use: Yes       Comment: rare    Drug use: Never         Family History         Family History   Problem Relation Age of Onset    Heart Attack Father              Review of Systems   Constitutional:  Negative for activity change, fatigue and fever.   HENT:  Negative for congestion, ear discharge, ear pain, hearing loss, nosebleeds, postnasal drip, rhinorrhea, sinus pressure, sinus pain, sore throat, tinnitus, trouble swallowing and voice change.    Respiratory:  Negative for cough, choking, wheezing and stridor.    Cardiovascular:  Negative for chest pain.  Gastrointestinal: Negative.    Genitourinary: Negative.    Skin:  Negative for color change and rash.   Neurological:  Negative for speech difficulty, light-headedness, numbness and headaches.   Hematological:  Negative for adenopathy.   Psychiatric/Behavioral:  Negative for behavioral problems.          Ht 1.905 m (6\' 3" )   Wt 132.5 kg (292 lb)   BMI 36.50 kg/m   Physical Exam  Constitutional:       Appearance: Normal appearance. He is normal weight.   HENT:      Head: Normocephalic and atraumatic.      Right Ear: Tympanic membrane, ear canal and external ear normal. No drainage.      Left Ear: Tympanic membrane, ear canal and external ear normal. No drainage.      Nose: Nose normal. No nasal deformity or septal deviation.      Mouth/Throat:      Mouth: Mucous membranes are moist.   Eyes:      General: Lids are normal. Vision grossly intact.      Extraocular Movements: Extraocular movements intact.      Conjunctiva/sclera: Conjunctivae normal.      Pupils: Pupils are equal, round, and reactive to light.   Neck:      Comments: Large palpable thyroid on right  Cardiovascular:      Rate and Rhythm: Normal rate.   Pulmonary:      Effort: Pulmonary effort is normal.   Musculoskeletal:         General: Normal range of motion.      Cervical back: Normal range of motion.   Lymphadenopathy:      Cervical: No cervical adenopathy.    Skin:     Capillary Refill: Capillary refill takes less than 2 seconds.   Neurological:      Mental Status: He is alert.   Psychiatric:         Mood and Affect: Mood normal.                 Path Number: [id number]    INTERPRETATION    THYROID, FINE NEEDLE ASPIRATION, RIGHT MID:  Satisfactory for evaluation.  NEGATIVE FOR HIGH GRADE UROTHELIAL CARCINOMA.  Bethesda System Category 2  Benign follicular cells, Hurthle cells, rare macrophages and some  colloid  Cellblock is similar.    Comment: Intradepartmental consultation is obtained.            IMPRESSION/PLAN:  Pt seen and examined for large thyroid nodule. FNA reviewed negative. Discussed option of hemithyroidectomy. Will likely proceed after knee replacement. Pt will schedule hemithyroidectomy and will follow up 1 week. TSH reviewed WNL.  After. Pt would have to be off xarelto and will need clearance from his cardiologist.      Dr. Almyra Brace. Bunevich D.O. Ms. Renae Fickle.  [facility]  Program Director:[facility]  Associate Clinical Professor:  Edd Fabian, [employer]  [facility]             [name].  1971/09/09        I have discussed the case, including pertinent history and exam findings with the resident. I have seen and examined the patient and the key elements of the encounter have been performed by me.  I agree with the assessment, plan and orders as documented by the resident.       Patient here for follow up of medical problems.  Remainder of medical problems as per resident note.        Almyra Brace BUNEVICH, DO  05/17/23

## 2023-08-12 NOTE — Progress Notes (Signed)
[facility]   PRE-ADMISSION TESTING GENERAL INSTRUCTIONS  PAT Phone Number: [phone]      GENERAL INSTRUCTIONS:    [x]  Antibacterial Soap Shower the Night before AND the morning of surgery.  [x]  Do not wear lotions, powders, deodorant the morning of surgery.  [x]  No solid food after midnight. You may have SIPS of clear liquids up until 2 hours before your arrival time to the hospital.   [x]  You may brush your teeth, gargle, but do not swallow water.   [x]  No tobacco products, illegal drugs, or alcohol within 24 hours of your surgery.  [x]  Jewelry or valuables should not be brought to the hospital. All body and/or tongue piercing's must be removed prior to arriving to hospital. No contact lens or hair pins.   [x]  Arrange transportation with a responsible adult companion to and from the hospital. Arrange for someone to be with you for the remainder of the day and for 24 hours after your procedure due to having had anesthesia.          -Who will be your companion for transportation? Wife        -Who will be staying with you for 24 hrs after your procedure? Wife  [x]  Office manager card and photo ID.     PARKING INSTRUCTIONS:     [x]  ARRIVAL DATE & TIME: 08/15/23 @ 0800  [x]  Times are subject to change. We will contact you the business day before surgery if that were to occur.  [x]  Enter into the [employer]. Two people may accompany you. Masks are not required.  [x]  Parking Lot "I" is where you will park. It is located on the corner of [address] and [address] The entrance is on [facility].   Only one vehicle - per patient, is permitted in parking lot.   Upon entering the parking lot, a voucher ticket will print.    MEDICATION INSTRUCTIONS:    [x]  Bring a complete list of your medications, please write the last time you took the medicine, give this list to the nurse in Pre-Op.  [x]  Take ONLY the following medications the morning of surgery: metoprolol succinate  (Toprol-XL).  [x]  Stop all herbal supplements and vitamins 5 days before surgery. Stop NSAIDS 7 days before surgery.  [x]  DO NOT take any diabetic medicine the morning of surgery.  Follow instructions for insulin the day before surgery.   *empagliflozin (Jardiance): Last Dose on 08/10/23  [x]  If you are diabetic and your blood sugar is low or you feel symptomatic, you may drink 1-2 ounces of apple juice or take a glucose tablet.            -The morning of your procedure, you may call the pre-op area if you have concerns about your blood sugar [phone].  [x]  Use your inhalers the morning of surgery.  Bring your emergency inhaler with you day of surgery.  [x]  Follow physician instructions regarding any blood thinners you may be taking.   *Xarelto: Last Dose on 08/10/23.    WHAT TO EXPECT:    [x]  The day of surgery you will be greeted and checked in by the [employer]. In addition, you will be registered in the [facility] by a Patient Visual merchandiser. Please bring your photo ID and insurance card. A nurse will greet you in accordance to the time you are needed in the pre-op area to prepare you for surgery. Please do not be discouraged if  you are not greeted in the order you arrive as there are many variables that are involved in patient preparation. Your patience is greatly appreciated as you wait for your nurse.   [x]  Delays may occur with surgery and staff will make a sincere effort to keep you informed of delays. If any delays occur with your procedure, we apologize ahead of time for your inconvenience as we recognize the value of your time.

## 2023-08-15 ENCOUNTER — Inpatient Hospital Stay: Payer: PRIVATE HEALTH INSURANCE | Attending: Facial Plastic Surgery

## 2023-08-15 LAB — CBC
Hematocrit: 43 % (ref 37.0–54.0)
Hemoglobin: 14.2 g/dL (ref 12.5–16.5)
MCH: 28.6 pg (ref 26.0–35.0)
MCHC: 33 g/dL (ref 32.0–34.5)
MCV: 86.7 fL (ref 80.0–99.9)
MPV: 8.7 fL (ref 7.0–12.0)
Platelets: 238 10*3/uL (ref 130–450)
RBC: 4.96 m/uL (ref 3.80–5.80)
RDW: 14.2 % (ref 11.5–15.0)
WBC: 4.1 10*3/uL — ABNORMAL LOW (ref 4.5–11.5)

## 2023-08-15 LAB — BASIC METABOLIC PANEL
Anion Gap: 11 mmol/L (ref 7–16)
BUN: 11 mg/dL (ref 6–20)
CO2: 22 mmol/L (ref 22–29)
Calcium: 8.8 mg/dL (ref 8.6–10.2)
Chloride: 105 mmol/L (ref 98–107)
Creatinine: 1 mg/dL (ref 0.70–1.20)
Est, Glom Filt Rate: >90 mL/min/{1.73_m2} (ref >60)
Glucose: 159 mg/dL — ABNORMAL HIGH (ref 74–99)
Potassium: 3.9 mmol/L (ref 3.5–5.0)
Sodium: 138 mmol/L (ref 132–146)

## 2023-08-15 LAB — POCT GLUCOSE: POC Glucose: 145 mg/dL — ABNORMAL HIGH (ref 74–99)

## 2023-08-15 MED ORDER — SODIUM CHLORIDE 0.9 % IV SOLN
0.9 | INTRAVENOUS | Status: DC | PRN
Start: 2023-08-15 — End: 2023-08-15
  Administered 2023-08-15: 12:00:00 via INTRAVENOUS

## 2023-08-15 MED ORDER — SODIUM CHLORIDE 0.9 % IV SOLN
0.9 | INTRAVENOUS | Status: DC | PRN
  Administered 2023-08-15: 15:00:00 .3 via INTRAVENOUS

## 2023-08-15 MED ORDER — NORMAL SALINE FLUSH 0.9 % IV SOLN
0.9 | Freq: Two times a day (BID) | INTRAVENOUS | Status: DC
Start: 2023-08-15 — End: 2023-08-15

## 2023-08-15 MED ORDER — ROCURONIUM BROMIDE 50 MG/5ML IV SOLN
50 | INTRAVENOUS | Status: AC
Start: 2023-08-15 — End: ?

## 2023-08-15 MED ORDER — LIDOCAINE-EPINEPHRINE 1 %-1:100000 IJ SOLN
1 | INTRAMUSCULAR | Status: DC | PRN
Start: 2023-08-15 — End: 2023-08-15
  Administered 2023-08-15: 15:00:00 10 via INTRADERMAL

## 2023-08-15 MED ORDER — NORMAL SALINE FLUSH 0.9 % IV SOLN
0.9 | INTRAVENOUS | Status: DC | PRN
Start: 2023-08-15 — End: 2023-08-15

## 2023-08-15 MED ORDER — FENTANYL CITRATE (PF) 100 MCG/2ML IJ SOLN
100 | INTRAMUSCULAR | Status: AC
Start: 2023-08-15 — End: ?

## 2023-08-15 MED ORDER — ONDANSETRON HCL 4 MG PO TABS
4 MG | ORAL_TABLET | Freq: Three times a day (TID) | ORAL | 0 refills | Status: DC | PRN
Start: 2023-08-15 — End: 2023-11-19
  Filled 2023-08-15: qty 6, 2d supply, fill #0

## 2023-08-15 MED ORDER — PROPOFOL 1000 MG/100ML IV EMUL
1000 | INTRAVENOUS | Status: AC
Start: 2023-08-15 — End: ?

## 2023-08-15 MED ORDER — SODIUM CHLORIDE (PF) 0.9 % IJ SOLN
0.9 | INTRAMUSCULAR | Status: AC
Start: 2023-08-15 — End: ?

## 2023-08-15 MED ORDER — ONDANSETRON HCL 4 MG/2ML IJ SOLN
4 | INTRAMUSCULAR | Status: DC | PRN
  Administered 2023-08-15: 17:00:00 4 via INTRAVENOUS

## 2023-08-15 MED ORDER — HYDROMORPHONE HCL PF 1 MG/ML IJ SOLN
1 | INTRAMUSCULAR | Status: DC | PRN
Start: 2023-08-15 — End: 2023-08-15
  Administered 2023-08-15: 18:00:00 0.5 mg via INTRAVENOUS

## 2023-08-15 MED ORDER — LABETALOL HCL 5 MG/ML IV SOLN
5 | Freq: Once | INTRAVENOUS | Status: AC
Start: 2023-08-15 — End: 2023-08-15
  Administered 2023-08-15: 18:00:00 10 mg via INTRAVENOUS

## 2023-08-15 MED ORDER — HYDROCODONE-ACETAMINOPHEN 5-325 MG PO TABS
5-325 | ORAL_TABLET | Freq: Four times a day (QID) | ORAL | 0 refills | Status: AC | PRN
Start: 2023-08-15 — End: 2023-08-18

## 2023-08-15 MED ORDER — MIDAZOLAM HCL 2 MG/2ML IJ SOLN
2 | INTRAMUSCULAR | Status: AC
Start: 2023-08-15 — End: ?

## 2023-08-15 MED ORDER — ROCURONIUM BROMIDE 50 MG/5ML IV SOLN
50 | INTRAVENOUS | Status: DC | PRN
  Administered 2023-08-15: 15:00:00 5 via INTRAVENOUS

## 2023-08-15 MED ORDER — HYDROMORPHONE HCL PF 1 MG/ML IJ SOLN
1 | INTRAMUSCULAR | Status: DC | PRN
Start: 2023-08-15 — End: 2023-08-15

## 2023-08-15 MED ORDER — SODIUM CHLORIDE (PF) 0.9 % IJ SOLN
0.9 | INTRAMUSCULAR | Status: DC | PRN
Start: 2023-08-15 — End: 2023-08-15

## 2023-08-15 MED ORDER — ONDANSETRON HCL 4 MG/2ML IJ SOLN
4 | INTRAMUSCULAR | Status: AC
Start: 2023-08-15 — End: ?

## 2023-08-15 MED ORDER — PROPOFOL 200 MG/20ML IV EMUL
200 | INTRAVENOUS | Status: AC
Start: 2023-08-15 — End: ?

## 2023-08-15 MED ORDER — PROPOFOL 1000 MG/100ML IV EMUL
1000 | INTRAVENOUS | Status: DC | PRN
  Administered 2023-08-15: 15:00:00 100 via INTRAVENOUS

## 2023-08-15 MED ORDER — SODIUM CHLORIDE 0.9 % IV SOLN
0.9 | INTRAVENOUS | Status: DC | PRN
  Administered 2023-08-15 (×2): via INTRAVENOUS

## 2023-08-15 MED ORDER — HYDROMORPHONE HCL 1 MG/ML IJ SOLN
1 | INTRAMUSCULAR | Status: DC
Start: 2023-08-15 — End: 2023-08-15

## 2023-08-15 MED ORDER — MEPERIDINE HCL 25 MG/ML IJ SOLN
25 | INTRAMUSCULAR | Status: DC | PRN
Start: 2023-08-15 — End: 2023-08-15

## 2023-08-15 MED ORDER — PROPOFOL 200 MG/20ML IV EMUL
200 | INTRAVENOUS | Status: DC | PRN
  Administered 2023-08-15: 15:00:00 250 via INTRAVENOUS

## 2023-08-15 MED ORDER — CEPHALEXIN 500 MG PO CAPS
500 | ORAL_CAPSULE | Freq: Two times a day (BID) | ORAL | 0 refills | Status: AC
Start: 2023-08-15 — End: 2023-08-20
  Filled 2023-08-15: qty 10, 5d supply, fill #0

## 2023-08-15 MED ORDER — REMIFENTANIL HCL 2 MG IV SOLR
2 | INTRAVENOUS | Status: AC
Start: 2023-08-15 — End: ?

## 2023-08-15 MED ORDER — DEXAMETHASONE SODIUM PHOSPHATE 10 MG/ML IJ SOLN
10 | INTRAMUSCULAR | Status: DC | PRN
  Administered 2023-08-15: 15:00:00 10 via INTRAVENOUS

## 2023-08-15 MED ORDER — LIDOCAINE-EPINEPHRINE 1 %-1:100000 IJ SOLN
1 | INTRAMUSCULAR | Status: AC
Start: 2023-08-15 — End: ?

## 2023-08-15 MED ORDER — CEFAZOLIN SODIUM 3 G IV SOLR
3 | INTRAVENOUS | Status: AC
Start: 2023-08-15 — End: 2023-08-15
  Administered 2023-08-15: 15:00:00 3000 mg via INTRAVENOUS

## 2023-08-15 MED ORDER — SUCCINYLCHOLINE CHLORIDE 200 MG/10ML IV SOSY
200 | INTRAVENOUS | Status: AC
Start: 2023-08-15 — End: ?

## 2023-08-15 MED ORDER — SUCCINYLCHOLINE CHLORIDE 200 MG/10ML IV SOSY
200 | INTRAVENOUS | Status: DC | PRN
  Administered 2023-08-15: 15:00:00 160 via INTRAVENOUS

## 2023-08-15 MED ORDER — LIDOCAINE (CARDIAC) 100 MG/5ML IV SOLN (MIXTURES ONLY)
100 | INTRAVENOUS | Status: DC | PRN
  Administered 2023-08-15: 15:00:00 50 via INTRAVENOUS

## 2023-08-15 MED ORDER — SODIUM CHLORIDE 0.9 % IV SOLN
0.9 | INTRAVENOUS | Status: DC | PRN
Start: 2023-08-15 — End: 2023-08-15

## 2023-08-15 MED ORDER — DEXAMETHASONE SODIUM PHOSPHATE 10 MG/ML IJ SOLN
10 | INTRAMUSCULAR | Status: AC
Start: 2023-08-15 — End: ?

## 2023-08-15 MED ORDER — LIDOCAINE (CARDIAC) 100 MG/5ML IV SOLN (MIXTURES ONLY)
100 | INTRAVENOUS | Status: AC
Start: 2023-08-15 — End: ?

## 2023-08-15 MED ORDER — MIDAZOLAM HCL 2 MG/2ML IJ SOLN
2 | INTRAMUSCULAR | Status: DC | PRN
  Administered 2023-08-15: 15:00:00 2 via INTRAVENOUS

## 2023-08-15 MED FILL — ROCURONIUM BROMIDE 50 MG/5ML IV SOLN: 50 MG/5ML | INTRAVENOUS | Qty: 5

## 2023-08-15 MED FILL — SODIUM CHLORIDE (PF) 0.9 % IJ SOLN: 0.9 % | INTRAMUSCULAR | Qty: 10

## 2023-08-15 MED FILL — FENTANYL CITRATE (PF) 100 MCG/2ML IJ SOLN: 100 MCG/2ML | INTRAMUSCULAR | Qty: 2

## 2023-08-15 MED FILL — DEXAMETHASONE SODIUM PHOSPHATE 10 MG/ML IJ SOLN: 10 MG/ML | INTRAMUSCULAR | Qty: 1

## 2023-08-15 MED FILL — REMIFENTANIL HCL 2 MG IV SOLR: 2 MG | INTRAVENOUS | Qty: 2000

## 2023-08-15 MED FILL — PROPOFOL 1000 MG/100ML IV EMUL: 1000 MG/100ML | INTRAVENOUS | Qty: 200

## 2023-08-15 MED FILL — SUCCINYLCHOLINE CHLORIDE 200 MG/10ML IV SOSY: 200 MG/10ML | INTRAVENOUS | Qty: 10

## 2023-08-15 MED FILL — PROPOFOL 200 MG/20ML IV EMUL: 200 MG/20ML | INTRAVENOUS | Qty: 20

## 2023-08-15 MED FILL — MIDAZOLAM HCL 2 MG/2ML IJ SOLN: 2 MG/ML | INTRAMUSCULAR | Qty: 2

## 2023-08-15 MED FILL — LABETALOL HCL 5 MG/ML IV SOLN: 5 MG/ML | INTRAVENOUS | Qty: 20

## 2023-08-15 MED FILL — CEFAZOLIN SODIUM 3 G IV SOLR: 3 g | INTRAVENOUS | Qty: 3000

## 2023-08-15 MED FILL — LIDOCAINE HCL (CARDIAC) PF 100 MG/5ML IV SOLN: 100 MG/5ML | INTRAVENOUS | Qty: 5

## 2023-08-15 MED FILL — LIDOCAINE-EPINEPHRINE 1 %-1:100000 IJ SOLN: 1 %-:00000 | INTRAMUSCULAR | Qty: 20

## 2023-08-15 MED FILL — DILAUDID 1 MG/ML IJ SOLN: 1 MG/ML | INTRAMUSCULAR | Qty: 1

## 2023-08-15 MED FILL — ONDANSETRON HCL 4 MG/2ML IJ SOLN: 4 MG/2ML | INTRAMUSCULAR | Qty: 2

## 2023-08-15 MED FILL — HYDROCODONE-ACETAMINOPHE 5-325 TABS: 5-325 5-325 MG | ORAL | 3 days supply | Qty: 12 | Fill #0 | Status: AC

## 2023-08-15 NOTE — Progress Notes (Signed)
CLINICAL PHARMACY NOTE: MEDS TO BEDS    Total # of Prescriptions Filled: 3   The following medications were delivered to the patient:  Zofran 4 mg   Keflex 500 mg   Norco 5/325 mg       Additional Documentation:

## 2023-08-15 NOTE — Anesthesia Pre-Procedure Evaluation (Signed)
Department of Anesthesiology  Preprocedure Note       Name:  Thomas Walton.   Age:  52 y.o.  DOB:  11/10/1971                                          MRN:  [id number]         Date:  08/15/2023      Surgeon: Moishe Spice):  Johnsie Cancel, DO    Procedure: Procedure(s):  HEMITHYROIDECTOMY/ NERVE INTEGRITY MONITOR    Medications prior to admission:   Prior to Admission medications    Medication Sig Start Date End Date Taking? Authorizing Provider   metFORMIN (GLUCOPHAGE-XR) 500 MG extended release tablet Take 1 tablet by mouth daily 05/09/23 11/05/23 Yes Vickey Huger, MD   metoprolol succinate (TOPROL XL) 50 MG extended release tablet Take 1 tablet by mouth in the morning and at bedtime 02/13/23  Yes Vickey Huger, MD   atorvastatin (LIPITOR) 40 MG tablet Take 1 tablet by mouth at bedtime 01/25/23  Yes Vickey Huger, MD   losartan (COZAAR) 50 MG tablet Take 1 tablet by mouth daily 12/13/22  Yes Vickey Huger, MD   vitamin D (ERGOCALCIFEROL) 1.25 MG (50000 UT) CAPS capsule Take 1 capsule by mouth once a week Takes every Monday    [provider]   albuterol sulfate HFA (VENTOLIN HFA) 108 (90 Base) MCG/ACT inhaler Inhale 2 puffs into the lungs every 6 hours as needed for Wheezing    [provider]   empagliflozin (JARDIANCE) 25 MG tablet Take 1 tablet by mouth daily 04/01/23   Vickey Huger, MD   rivaroxaban Carlena Hurl) 20 MG TABS tablet Take 1 tablet by mouth daily 10/10/22   Vickey Huger, MD       Current medications:    Current Facility-Administered Medications   Medication Dose Route Frequency Provider Last Rate Last Admin    sodium chloride flush 0.9 % injection 5-40 mL  5-40 mL IntraVENous 2 times per day Thurnell Lose, DO        sodium chloride flush 0.9 % injection 5-40 mL  5-40 mL IntraVENous PRN Thurnell Lose, DO        0.9 % sodium chloride infusion   IntraVENous PRN Thurnell Lose, DO 5 mL/hr at 08/15/23 0800 New Bag at 08/15/23 0800    ceFAZolin Sodium (ANCEF) 3,000 mg in sodium chloride 0.9 %  100 mL IVPB (Vial2Bag)  3,000 mg IntraVENous On Call to OR Thurnell Lose, DO           Allergies:    Allergies   Allergen Reactions    Shellfish-Derived Products Anaphylaxis     Crab, lobster, shrimp, etc.       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   Age:  52 y.o.  DOB:  11/10/1971                                          MRN:  [id number]         Date:  08/15/2023      Surgeon: Moishe Spice):  Johnsie Cancel, DO    Procedure: Procedure(s):  HEMITHYROIDECTOMY/ NERVE INTEGRITY MONITOR    Medications prior to admission:   Prior to Admission medications    Medication Sig Start Date End Date Taking? Authorizing Provider   metFORMIN (GLUCOPHAGE-XR) 500 MG extended release tablet Take 1 tablet by mouth daily 05/09/23 11/05/23 Yes Vickey Huger, MD   metoprolol succinate (TOPROL XL) 50 MG extended release tablet Take 1 tablet by mouth in the morning and at bedtime 02/13/23  Yes Vickey Huger, MD   atorvastatin (LIPITOR) 40 MG tablet Take 1 tablet by mouth at bedtime 01/25/23  Yes Vickey Huger, MD   losartan (COZAAR) 50 MG tablet Take 1 tablet by mouth daily 12/13/22  Yes Vickey Huger, MD   vitamin D (ERGOCALCIFEROL) 1.25 MG (50000 UT) CAPS capsule Take 1 capsule by mouth once a week Takes every Monday    [provider]   albuterol sulfate HFA (VENTOLIN HFA) 108 (90 Base) MCG/ACT inhaler Inhale 2 puffs into the lungs every 6 hours as needed for Wheezing    [provider]   empagliflozin (JARDIANCE) 25 MG tablet Take 1 tablet by mouth daily 04/01/23   Vickey Huger, MD   rivaroxaban Carlena Hurl) 20 MG TABS tablet Take 1 tablet by mouth daily 10/10/22   Vickey Huger, MD       Current medications:    Current Facility-Administered Medications   Medication Dose Route Frequency Provider Last Rate Last Admin    sodium chloride flush 0.9 % injection 5-40 mL  5-40 mL IntraVENous 2 times per day Thurnell Lose, DO        sodium chloride flush 0.9 % injection 5-40 mL  5-40 mL IntraVENous PRN Thurnell Lose, DO        0.9 % sodium chloride infusion   IntraVENous PRN Thurnell Lose, DO 5 mL/hr at 08/15/23 0800 New Bag at 08/15/23 0800    ceFAZolin Sodium (ANCEF) 3,000 mg in sodium chloride 0.9 %  100 mL IVPB (Vial2Bag)  3,000 mg IntraVENous On Call to OR Thurnell Lose, DO           Allergies:    Allergies   Allergen Reactions    Shellfish-Derived Products Anaphylaxis     Crab, lobster, shrimp, etc.       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   Age:  52 y.o.  DOB:  11/10/1971                                          MRN:  [id number]         Date:  08/15/2023      Surgeon: Moishe Spice):  Johnsie Cancel, DO    Procedure: Procedure(s):  HEMITHYROIDECTOMY/ NERVE INTEGRITY MONITOR    Medications prior to admission:   Prior to Admission medications    Medication Sig Start Date End Date Taking? Authorizing Provider   metFORMIN (GLUCOPHAGE-XR) 500 MG extended release tablet Take 1 tablet by mouth daily 05/09/23 11/05/23 Yes Vickey Huger, MD   metoprolol succinate (TOPROL XL) 50 MG extended release tablet Take 1 tablet by mouth in the morning and at bedtime 02/13/23  Yes Vickey Huger, MD   atorvastatin (LIPITOR) 40 MG tablet Take 1 tablet by mouth at bedtime 01/25/23  Yes Vickey Huger, MD   losartan (COZAAR) 50 MG tablet Take 1 tablet by mouth daily 12/13/22  Yes Vickey Huger, MD   vitamin D (ERGOCALCIFEROL) 1.25 MG (50000 UT) CAPS capsule Take 1 capsule by mouth once a week Takes every Monday    [provider]   albuterol sulfate HFA (VENTOLIN HFA) 108 (90 Base) MCG/ACT inhaler Inhale 2 puffs into the lungs every 6 hours as needed for Wheezing    [provider]   empagliflozin (JARDIANCE) 25 MG tablet Take 1 tablet by mouth daily 04/01/23   Vickey Huger, MD   rivaroxaban Carlena Hurl) 20 MG TABS tablet Take 1 tablet by mouth daily 10/10/22   Vickey Huger, MD       Current medications:    Current Facility-Administered Medications   Medication Dose Route Frequency Provider Last Rate Last Admin    sodium chloride flush 0.9 % injection 5-40 mL  5-40 mL IntraVENous 2 times per day Thurnell Lose, DO        sodium chloride flush 0.9 % injection 5-40 mL  5-40 mL IntraVENous PRN Thurnell Lose, DO        0.9 % sodium chloride infusion   IntraVENous PRN Thurnell Lose, DO 5 mL/hr at 08/15/23 0800 New Bag at 08/15/23 0800    ceFAZolin Sodium (ANCEF) 3,000 mg in sodium chloride 0.9 %  100 mL IVPB (Vial2Bag)  3,000 mg IntraVENous On Call to OR Thurnell Lose, DO           Allergies:    Allergies   Allergen Reactions    Shellfish-Derived Products Anaphylaxis     Crab, lobster, shrimp, etc.       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    Degenerative arthritis  K 3.9 08/15/2023 07:55 AM    K 4.5 12/15/2021 08:34 PM    CL 105 08/15/2023 07:55 AM    CO2 22 08/15/2023 07:55 AM    BUN 11 08/15/2023 07:55 AM    CREATININE 1.0 08/15/2023 07:55 AM    GFRAA >60 04/30/2021 06:38 PM    LABGLOM >90 08/15/2023 07:55 AM    LABGLOM >60 03/14/2023 10:37 AM    GLUCOSE 159 08/15/2023 07:55 AM    CALCIUM 8.8 08/15/2023 07:55 AM    BILITOT 0.5 03/14/2023 10:37 AM    ALKPHOS 70 03/14/2023 10:37 AM    AST 22 03/14/2023 10:37 AM    ALT 30 03/14/2023 10:37 AM       POC Tests:   Recent Labs     08/15/23  0803   POCGLU 145*       Coags:   Lab Results   Component Value Date/Time    APTT 39.4 12/09/2020 07:44 PM       HCG (If Applicable): No results found for: "PREGTESTUR", "PREGSERUM", "HCG", "HCGQUANT"     ABGs: No results found for: "PHART", "PO2ART", "PCO2ART", "HCO3ART", "BEART", "O2SATART"     Type & Screen (If Applicable):  No results found for: "LABABO"    Drug/Infectious Status (If Applicable):  No results found for: "HIV", "HEPCAB"    COVID-19 Screening (If Applicable):   Lab Results   Component Value Date/Time    COVID19 Not-Detected 12/18/2022 11:55 AM    COVID19 Not Detected 12/05/2020 10:00 PM            Anesthesia Evaluation  Patient summary reviewed and Nursing notes reviewed   no history of anesthetic complications:   Airway: Mallampati: IV  TM distance: >3 FB   Neck ROM: full  Mouth opening: > = 3 FB   Dental: normal exam         Pulmonary: breath sounds clear to auscultation  (+)     sleep apnea: on CPAP,       asthma:                            Cardiovascular:  Exercise tolerance: good (>4 METS)  (+) hypertension: moderate, dysrhythmias: atrial fibrillation, hyperlipidemia        Rhythm: regular  Rate: normal           Beta Blocker:  Dose within 24 Hrs      ROS comment:   Procedure Note                [facility]                  [address] [place], [place] [address]                             CARDIAC CATHETERIZATION     PATIENT NAME: CLEAVEN, REUTHER                      DOB:        1971-04-04  MED REC NO:   [id number]                            ROOM:       6322  ACCOUNT NO:   [id number]

## 2023-08-15 NOTE — Discharge Instructions (Signed)
THYROIDECTOMY DISCHARGE INSTRUCTIONS    Call our office for any questions/concerns and for follow up appointment    Please follow the instructions checked below:    ACTIVITY INSTRUCTIONS:  [x] Increase activity as tolerated    [x] No heavy lifting or strenuous activity     [x] No driving while taking pain medication    WOUND/DRESSING INSTRUCTIONS:  [x] May shower      [x] No sitting in bath tub, hot tub or swimming.   [x] Ice to areas of pain for first 24 hours.      [x] Your wound was sealed with a coat mastisol and steri strips. All the sutures are underneath the skin. The steri strips will be removed in the office at your follow up appointment.        MEDICATION INSTRUCTIONS:  [x] Take medication as prescribed.  [x] When taking pain medications, you may experience dizziness or drowsiness.  Do not drink alcohol or drive when taking these medications.  [x] You may take Ibuprofen (over the counter) as per directions for mild pain.  [] Do not take any other acetaminophen (Tylenol) products while taking your pain medication.  [x] You may experience constipation while taking pain medication - You may take over the counter stool softeners: docuscate (Colace) or sennosides S (Senokot - S).     WORK:  [] You may return to work without restrictions   [x] You may not return to work until after follow up appointment with your physcian    Call physician or go to the ER for any of the following or for questions/concerns:   Fever over 101 F    Redness, swelling, hardness or warmth at the wound site (s)  Unrelieved nausea/vomiting    Foul smelling or cloudy drainage at the wound site (s)   Unrelieved pain or increase in pain     Increase in shortness of breath   Symptoms of Hypocalcemia as discussed:   Tingling in your hands, feet, or lips  Muscle spasms or weakness, or facial twitching  Shaking or loss of body control  Seizures  Slow or uneven heartbeat, or lightheadedness  Anxiety, depression, anger, or confusion  Seeing or hearing things  that are not really there

## 2023-08-15 NOTE — Op Note (Signed)
Operative Note      Patient: Thomas Walton.  Date of Birth: 1971-09-13  MRN: [id number]    Date of Procedure: 08/15/2023    Pre-op diagnosis: Right thyroid nodule    Post-Op Diagnosis: Same       Procedure: Right hemithyroidectomy     Surgeon(s):  Johnsie Cancel, DO    Assistant:   Surgical Assistant: Laurita Quint  Resident: Laurence Compton, DO; [place], Arline Asp, DO; Thurnell Lose, DO    Anesthesia: General    Estimated Blood Loss (mL): 50 cc    Complications: None    Specimens:   ID Type Source Tests Collected by Time Destination   A : RIGHT THYROID AND ISTHMUS Tissue Tissue SURGICAL PATHOLOGY Johnsie Cancel, DO 08/15/2023 1234        Implants:  * No implants in log *      Drains:   Closed/Suction Drain Anterior Neck Bulb (Active)       Findings: large right thyroid nodule    Detailed Description of Procedure:     HISTORY: Thomas Walton. is a 52 y.o. male with history of large right thyroid nodule. He presents today for right hemithyroidectomy . The risks and benefits of this procedure were discussed with the patient. The risks including, but not limited to, pain; bleeding; infection; scarring; damage to surrounding structures; recurrence; and the need for further procedures, were explained. The patient acknowledged and understood the risks, and agreed to continue with the procedure.     PROCEDURE: The patient was brought in the operating room suite. The patient was placed in the supine position.SCD's were in place, and it was confirmed that she received preoperative antibiotics. After establishment of general anesthesia via orotracheal intubation with a nerve integrity monitoring system endotracheal tube, the eyes were protected with Tegaderm. Nerve integrity monitoring system endotracheal tube was confirmed to be working adequately and secured. The area of planned incision  Was injected with 10 cc of 1% lidocaine with epinepherine . The patient was prepped and draped in a sterile fashion.  A #15 blade scalpel was used to make a 8cm incision in the neck.   Subplatysmal flaps were raised to the thyroid notch and sternal notch respectively. Strap muscles were isolated in the midline and dissected and mobilized from the right thyroid lobe .  Starting with the right side, careful dissection along the thyroid lobe allowed for identification of the superior thyroid artery and vein which were individually ligated with a Ligasure. Other bleeding vessels were controlled with double ties and ligation. The inferior and superior parathyroid glands were identified and preserved. The recurrent laryngeal nerve was identified and then preserved.  Dissection continued over Berry's ligament to remove the thyroid from the trachea. Once the gland was removed from the surgical bed it was sent off for permanent pathology.    The wound was copiously irrigated. Hemostatsis was obtained with direct pressure and electrocautery. A drain was placed and arista applied to the wound base. The platysma was then approximated using 3-0 vicryl suture. 3-0 vicryl suture was used to approximate the deep dermal layer and the skin was closed with 4-0 monocryl in a running subcuticular fashion. The patient was extubated in the operating room table, sent to the postanesthesia care unit in good condition. There were no complications. All sponge and instrument counts were correct times two.      Dr. Bayard Males was present for the entire procedure    Electronically signed by Joselyn Glassman  Colleen Donahoe, DO on 08/15/2023 at 1:19 PM

## 2023-08-15 NOTE — H&P (Signed)
Thomas Walton. was seen and re-examined preoperatively today, August 15, 2023.  There was no substantial change in his physical and medical status. All Meds and Family/Social/Previous history was reviewed and there were no significant changes. Patient is fit for the proposed surgical procedure.  All questions were appropriately addressed and had no further questions regarding the risks, benefits, and alternatives of the procedure.  Thomas Walton. and family wished to proceed.    Bernette Mayers, DO  Resident Physician  [facility]  Otolaryngology Residency  08/15/2023  9:52 AM

## 2023-08-15 NOTE — Anesthesia Post-Procedure Evaluation (Signed)
Department of Anesthesiology  Postprocedure Note    Patient: Thomas Walton.  MRN: [id number]  Birthdate: 08-23-1971  Date of evaluation: 08/15/2023    Procedure Summary       Date: 08/15/23 Room / Location: [facility] OR 05 / [facility]    Anesthesia Start: 1038 Anesthesia Stop:     Procedure: HEMITHYROIDECTOMY/ NERVE INTEGRITY MONITOR (Right: Neck) Diagnosis:       Thyroid nodule      (Thyroid nodule [E04.1])    Surgeons: Johnsie Cancel, DO Responsible Provider: Sherron Monday, MD    Anesthesia Type: general, TIVA ASA Status: 3            Anesthesia Type: No value filed.    Aldrete Phase I: Aldrete Score: 10    Aldrete Phase II:      Anesthesia Post Evaluation    Patient location during evaluation: PACU  Patient participation: complete - patient participated  Level of consciousness: awake  Pain score: 3  Airway patency: patent  Nausea & Vomiting: no nausea and no vomiting  Cardiovascular status: blood pressure returned to baseline  Respiratory status: acceptable  Hydration status: euvolemic    No notable events documented.

## 2023-08-15 NOTE — Progress Notes (Signed)
 Patient armband verified with patient and placed on patient. Patient IV fluids rate decreased after placement of IV.

## 2023-08-16 NOTE — Telephone Encounter (Signed)
Please advis eif you would like this PT seen in [place] on 8/19 as PT count is at 73 for the day

## 2023-08-16 NOTE — Telephone Encounter (Signed)
Procedure yesterday and was told to come in on Monday to have drain removed.  Contact spouse Misty Stanley to schedule.

## 2023-08-16 NOTE — Telephone Encounter (Signed)
Returned call to spouse Bonita Quin. PT scheduled 08/19/23 at 8:30 in [place]

## 2023-08-19 ENCOUNTER — Ambulatory Visit
Admit: 2023-08-19 | Discharge: 2023-08-19 | Payer: PRIVATE HEALTH INSURANCE | Attending: Facial Plastic Surgery | Primary: Family Medicine

## 2023-08-19 NOTE — Progress Notes (Unsigned)
[facility]  Dr. Lorine Walton. [facility], D.O. Ms.Ed.  New Consult       Patient Name:  Thomas [facility].  DOB:  Dec 16, 1971     CHIEF C/O:    Chief Complaint   Patient presents with    Post-Op Check     Patient presents for post op drain removal, reports that there has been no major issues except sleep issues.        HISTORY OBTAINED FROM:  patient    HISTORY OF PRESENT ILLNESS:       Thomas Walton is a 52 y.o. year old male, here today for thyroid review. US showed 6cm thyroid nodule. First noticed 6 months ago. Growing. Denies dysphagia and shortness of breath. History of afib on Xarelto. Pt does state he has an upcoming knee replacement surgery in a month.     08/19/23: Patient seen today for 1 week post op check and drain removal. Voice is strong. Pain well controlled. 10 cc from drain in past 24 hours.       Past Medical History:   Diagnosis Date    Arthritis     Left knee    Asthma     Diabetes (HCC)     HLD (hyperlipidemia)     HTN (hypertension)     OSA on CPAP     PAF (paroxysmal atrial fibrillation) (HCC)     Thyroid nodule     to have surgery in August 2024     Past Surgical History:   Procedure Laterality Date    ABDOMEN SURGERY Right 05/22/2022    EXCISION RIGHT CHEST WALL CYST performed by Thomas Dy, MD at [facility]    CARDIAC CATHETERIZATION  12/12/2020    THYROIDECTOMY Right 08/15/2023    HEMITHYROIDECTOMY performed by Thomas Cancel, DO at [facility]       Current Outpatient Medications:     ondansetron (ZOFRAN) 4 MG tablet, Take 1 tablet by mouth every 8 hours as needed for Nausea Walton Vomiting, Disp: 6 tablet, Rfl: 0    cephALEXin (KEFLEX) 500 MG capsule, Take 1 capsule by mouth 2 times daily for 5 days, Disp: 10 capsule, Rfl: 0    vitamin D (ERGOCALCIFEROL) 1.25 MG (50000 UT) CAPS capsule, Take 1 capsule by mouth once a week Takes every Monday, Disp: , Rfl:     albuterol sulfate HFA (VENTOLIN HFA) 108 (90 Base) MCG/ACT inhaler, Inhale 2 puffs into the lungs every 6 hours as needed for Wheezing, Disp: ,  Rfl:     metFORMIN (GLUCOPHAGE-XR) 500 MG extended release tablet, Take 1 tablet by mouth daily, Disp: 90 tablet, Rfl: 1    empagliflozin (JARDIANCE) 25 MG tablet, Take 1 tablet by mouth daily, Disp: 90 tablet, Rfl: 1    metoprolol succinate (TOPROL XL) 50 MG extended release tablet, Take 1 tablet by mouth in the morning and at bedtime, Disp: 180 tablet, Rfl: 3    atorvastatin (LIPITOR) 40 MG tablet, Take 1 tablet by mouth at bedtime, Disp: 90 tablet, Rfl: 3    losartan (COZAAR) 50 MG tablet, Take 1 tablet by mouth daily, Disp: 90 tablet, Rfl: 3  Shellfish-derived products  Social History     Tobacco Use    Smoking status: Never    Smokeless tobacco: Never   Vaping Use    Vaping status: Never Used   Substance Use Topics    Alcohol use: Yes     Comment: rare    Drug use: Never  Family History   Problem Relation Age of Onset    Heart Attack Father        Review of Systems   Constitutional:  Negative for activity change, fatigue and fever.   HENT:  Negative for congestion, ear discharge, ear pain, hearing loss, nosebleeds, postnasal drip, rhinorrhea, sinus pressure, sinus pain, sore throat, tinnitus, trouble swallowing and voice change.    Respiratory:  Negative for cough, choking, wheezing and stridor.    Cardiovascular:  Negative for chest pain.   Gastrointestinal: Negative.    Genitourinary: Negative.    Skin:  Negative for color change and rash.   Neurological:  Negative for speech difficulty, light-headedness, numbness and headaches.   Hematological:  Negative for adenopathy.   Psychiatric/Behavioral:  Negative for behavioral problems.        Ht 1.93 m (6\' 4" )   Wt 132.5 kg (292 lb)   BMI 35.54 kg/m   Physical Exam  Constitutional:       Appearance: Normal appearance. He is normal weight.   HENT:      Head: Normocephalic and atraumatic.      Right Ear: Tympanic membrane, ear canal and external ear normal. No drainage.      Left Ear: Tympanic membrane, ear canal and external ear normal. No drainage.       Nose: Nose normal. No nasal deformity Walton septal deviation.      Mouth/Throat:      Mouth: Mucous membranes are moist.   Eyes:      General: Lids are normal. Vision grossly intact.      Extraocular Movements: Extraocular movements intact.      Conjunctiva/sclera: Conjunctivae normal.      Pupils: Pupils are equal, round, and reactive to light.   Neck:      Comments: Midline neck incision is c/d/I, drain with scant SS drainage.  Cardiovascular:      Rate and Rhythm: Normal rate.   Pulmonary:      Effort: Pulmonary effort is normal.   Musculoskeletal:         General: Normal range of motion.      Cervical back: Normal range of motion.   Lymphadenopathy:      Cervical: No cervical adenopathy.   Skin:     Capillary Refill: Capillary refill takes less than 2 seconds.   Neurological:      Mental Status: He is alert.   Psychiatric:         Mood and Affect: Mood normal.             Path Number: [id number]    INTERPRETATION    THYROID, FINE NEEDLE ASPIRATION, RIGHT MID:  Satisfactory for evaluation.  NEGATIVE FOR HIGH GRADE UROTHELIAL CARCINOMA.  Bethesda System Category 2  Benign follicular cells, Hurthle cells, rare macrophages and some  colloid  Cellblock is similar.    Comment: Intradepartmental consultation is obtained.          IMPRESSION/PLAN:  Pt seen and examined s/p right hemithyroidectomy for thyroid nodule. Drain removed. Recommend antibiotic ointment for the next 3 days followed by vaseline. Follow up as scheduled next week.     Dr. Jilda Panda D. [facility] D.O. Ms. Renae Fickle.  Otolaryngology Facial Plastic Surgery  Program Director:[facility]  Associate Clinical Professor:  [facility], NEOMED  [facility]          Thomas [facility].  11-22-1971      I have discussed the case, including pertinent history and  exam findings with the resident. I have seen and examined the patient and the key elements of the encounter have been performed by me.  I agree with the assessment,  plan and orders as documented by the resident.      Patient here for follow up of medical problems.         Remainder of medical problems as per resident note.      Thurnell Lose, DO  05/17/23

## 2023-08-23 LAB — SURGICAL PATHOLOGY REPORT

## 2023-08-26 ENCOUNTER — Ambulatory Visit
Admit: 2023-08-26 | Discharge: 2023-08-26 | Payer: PRIVATE HEALTH INSURANCE | Attending: Facial Plastic Surgery | Primary: Family Medicine

## 2023-08-26 DIAGNOSIS — E041 Nontoxic single thyroid nodule: Secondary | ICD-10-CM

## 2023-08-26 NOTE — Progress Notes (Unsigned)
[facility]  Dr. Jilda Panda D. Bayard Males, D.O. Ms.Ed  Post-Op Follow Up        Patient Name:  [facility].  DOB:  07/17/1971     CHIEF C/O:    Chief Complaint   Patient presents with    Post-Op Check     Patient presents for 2 week post op thyroid check. Still getting some mild tightness at the incision sight.        HISTORY OBTAINED FROM:  {HISTORY SOURCE:[id number]}    HISTORY OF PRESENT ILLNESS:       Thomas Walton is a 52 y.o. year old male, here today for follow up of:       Dpong wwell and will see in 6 weeks         Review of Systems    BP 121/82 (Site: Left Upper Arm, Position: Sitting, Cuff Size: Large Adult)   Pulse 66   Ht 1.93 m (6\' 4" )   Wt 132.5 kg (292 lb)   BMI 35.54 kg/m   Physical Exam      IMPRESSION/PLAN:  1. Thyroid nodule  -     TSH  -     T4; Future      Dr. Jilda Panda D. Deniece Rankin D.O. Ms. Renae Fickle.  Otolaryngology Facial Plastic Surgery  Program Director:[facility]  Associate Clinical Professor:  Edd Fabian, NEOMED  [facility]

## 2023-09-19 ENCOUNTER — Telehealth
Admit: 2023-09-19 | Discharge: 2023-09-24 | Payer: PRIVATE HEALTH INSURANCE | Attending: Critical Care Medicine | Primary: Family Medicine

## 2023-09-19 DIAGNOSIS — G4733 Obstructive sleep apnea (adult) (pediatric): Secondary | ICD-10-CM

## 2023-09-19 NOTE — Progress Notes (Signed)
 [facility].   09/19/23      INTERIM HISTORY  [facility]. is being seen today for the known diagnosis for Obstructive Sleep Apnea.  Notes from previous office visits including the sleep study were reviewed.  He is  compliant with the use of his PAP machine. He uses the machine for 7 hours each night. Patient does not have headaches in the morning and does not have night sweats. He does not always feel refreshed in the morning and does not have daytime sleepiness. Patient does not take naps. He does not snore with the machine on at night and he does not wake up gagging, choking or snorting with the machine nocturnally. The patient does not have increased urination throughout the night. Others have not noted apnea when using the PAP.  There is not recent chest pain or palpitations.         09/19/2023     1:20 PM 08/09/2022    10:14 AM 07/25/2021     2:16 AM 07/25/2021     2:15 AM   Sleep Medicine   Sitting and reading 1 2 2     Watching TV 1 1 3     Sitting, inactive in a public place (e.g. a theatre or a meeting) 1 3 0    As a passenger in a car for an hour without a break 0 1 3    Lying down to rest in the afternoon when circumstances permit 2 2 3     Sitting and talking to someone 0 0 0    Sitting quietly after a lunch without alcohol 1 0 1    In a car, while stopped for a few minutes in traffic 0 0 0    Epworth Sleepiness Score 6 9 12     Neck (Inches) 19 18  19       ALLERGIES  Allergies   Allergen Reactions    Shellfish-Derived Products Anaphylaxis     Crab, lobster, shrimp, etc.     SOCIAL HISTORY  He  reports that he has never smoked. He has never used smokeless tobacco. He reports current alcohol use. He reports that he does not use drugs.    PAST MEDICAL HISTORY  He  has a past medical history of Arthritis, Asthma, Diabetes (HCC), HLD (hyperlipidemia), HTN (hypertension), OSA on CPAP, PAF (paroxysmal atrial fibrillation) (HCC), and Thyroid nodule.     MEDICATIONS      The patient has a current  medication list which includes the following prescription(s): xarelto , ondansetron , vitamin d , metformin , empagliflozin , metoprolol  succinate, atorvastatin , losartan , and albuterol  sulfate hfa.    IMMUNIZATIONS  Immunizations are up to date    REVIEW OF SYSTEMS  The patient has no problems with hearing. There is no problem with seeing, smelling, or tasting. There is no history of seizure, syncope, or stroke. There is no history of hemoptysis, hematemesis, hematochezia, hematuria, or dysuria. Other than as noted above, the review of systems is unremarkable.     OBJECTIVE: On physical examination, the patient is awake and alert and in no acute distress. There were no vitals taken for this visit. There is no height or weight on file to calculate BMI.  As this is a virtual encounter, the remainder of the physical examination will be deferred.    DIAGNOSIS   Diagnosis Orders   1. OSA on CPAP        2. Paroxysmal atrial fibrillation (HCC)        3. Primary  hypertension        4. Type 2 diabetes mellitus without complication, without long-term current use of insulin  (HCC)          DISCUSSION   [facility]. seems to be doing well.  He uses his CPAP nightly and has no symptoms of sleep apnea.  His ESS is normal at 6.    We did have a discussion about some of the new issues in sleep medicine that have come up over the past year.      All questions were answered.    PLAN:  Sleep clinic in 12 months  No changes in PAP settings at this time.         Dale JINNY Mutton, MD   Diplomat in Sleep Medicine, [employer]      [facility]., was evaluated through a synchronous ( real time ) audio-video encounter. The patient (or guardian if applicable) is aware that is billable service, which includes applicable co-pays. The Virtual Visit was conducted with patient's ( and/or legal guardian's ) consent.  Patient identification was verified, and a caregiver was present when appropriate. The patient was located in a state where the  provider was licensed to provide care.

## 2023-10-08 ENCOUNTER — Ambulatory Visit: Admit: 2023-10-08 | Payer: PRIVATE HEALTH INSURANCE | Primary: Family Medicine

## 2023-10-08 ENCOUNTER — Encounter

## 2023-10-08 ENCOUNTER — Encounter
Admit: 2023-10-08 | Discharge: 2023-10-08 | Payer: PRIVATE HEALTH INSURANCE | Attending: Adult Health | Primary: Family Medicine

## 2023-10-08 DIAGNOSIS — M25512 Pain in left shoulder: Secondary | ICD-10-CM

## 2023-10-08 MED ORDER — EMPAGLIFLOZIN 25 MG PO TABS
25 | ORAL_TABLET | Freq: Every day | ORAL | 3 refills | 30.00000 days | Status: DC
Start: 2023-10-08 — End: 2024-11-27

## 2023-10-08 NOTE — Progress Notes (Signed)
[facility]  ORTHOPAEDICS AND SPORTS MEDICINE  DATE OF VISIT: 10/08/23  New Ailment Shoulder Patient Visit     Referring Provider:   No referring provider defined for this encounter.    CHIEF COMPLAINT:   Chief Complaint   Patient presents with    Shoulder Pain     Pt is here today for left shoulder pain, no injury noted, no treatment to date. Pt states when he goes to reach from something he gets pain throughout the shoulder joint.         HPI:      [facility] is a 52 y.o. year old male who is seen today  for evaluation of left shoulder pain.  Patient is known to the practice and is scheduled for an upcoming elective knee replacement in November.  States that about 1 week ago he began experiencing pain to the left shoulder.  He does not recall a specific injury when the symptoms started the states he noticed pain in the middle the night when pulling the bed sheets but is unsure if that could be the cause of his pain.  He denies history of shoulder pain.  Denies mechanical symptoms, feelings of instability or weakness.  Reports a stiffness with range of motion.  He is currently employed and working.  He is right-hand dominant      PAST MEDICAL HISTORY  Past Medical History:   Diagnosis Date    Arthritis     Left knee    Asthma     Diabetes (HCC)     HLD (hyperlipidemia)     HTN (hypertension)     OSA on CPAP     PAF (paroxysmal atrial fibrillation) (HCC)     Thyroid nodule     to have surgery in August 2024       PAST SURGICAL HISTORY  Past Surgical History:   Procedure Laterality Date    ABDOMEN SURGERY Right 05/22/2022    EXCISION RIGHT CHEST WALL CYST performed by Lorene Dy, MD at [facility]    CARDIAC CATHETERIZATION  12/12/2020    THYROIDECTOMY Right 08/15/2023    HEMITHYROIDECTOMY performed by Johnsie Cancel, DO at [facility] OR       FAMILY HISTORY   Family History   Problem Relation Age of Onset    Heart Attack Father        SOCIAL HISTORY  Social History     Occupational History    Not on file   Tobacco  Use    Smoking status: Never    Smokeless tobacco: Never   Vaping Use    Vaping status: Never Used   Substance and Sexual Activity    Alcohol use: Yes     Comment: rare    Drug use: Never    Sexual activity: Not on file       CURRENT MEDICATIONS     Current Outpatient Medications:     empagliflozin (JARDIANCE) 25 MG tablet, Take 1 tablet by mouth daily, Disp: 90 tablet, Rfl: 3    XARELTO 20 MG TABS tablet, Take 1 tablet by mouth daily, Disp: , Rfl:     ondansetron (ZOFRAN) 4 MG tablet, Take 1 tablet by mouth every 8 hours as needed for Nausea or Vomiting, Disp: 6 tablet, Rfl: 0    vitamin D (ERGOCALCIFEROL) 1.25 MG (50000 UT) CAPS capsule, Take 1 capsule by mouth once a week Takes every Monday, Disp: , Rfl:     albuterol sulfate HFA (VENTOLIN  HFA) 108 (90 Base) MCG/ACT inhaler, Inhale 2 puffs into the lungs every 6 hours as needed for Wheezing, Disp: , Rfl:     metFORMIN (GLUCOPHAGE-XR) 500 MG extended release tablet, Take 1 tablet by mouth daily, Disp: 90 tablet, Rfl: 1    metoprolol succinate (TOPROL XL) 50 MG extended release tablet, Take 1 tablet by mouth in the morning and at bedtime, Disp: 180 tablet, Rfl: 3    atorvastatin (LIPITOR) 40 MG tablet, Take 1 tablet by mouth at bedtime, Disp: 90 tablet, Rfl: 3    losartan (COZAAR) 50 MG tablet, Take 1 tablet by mouth daily, Disp: 90 tablet, Rfl: 3    ALLERGIES  Allergies   Allergen Reactions    Shellfish-Derived Products Anaphylaxis     Crab, lobster, shrimp, etc.       Controlled Substances Monitoring:        REVIEW OF SYSTEMS:     General: Negative Fever, chills, fatigue   Cardiovascular: Negative chest pain, palpitations  Respiratory: Equal chest expansion, negative shortness of breath   Skin: Negative rash, open wounds  Psych: Normal affect, mood stable  Neurologic: sensation grossly intact in extremities   Musculoskeletal: See HPI      PHYSICAL EXAM     Vitals:    10/08/23 1450   Weight: 132.5 kg (292 lb)   Height: 1.93 m (6\' 4" )       Height: 1.93 m (6\' 4" )   Weight: @PATIENTWT @ BMI:  Body mass index is 35.54 kg/m.    General: The patient is alert and oriented x 3, appears to be stated age and in no distress.   HEENT: head is normocephalic, atraumatic.  EOMI.   Neck: supple, trachea midline, no thyromegaly   Cardiovascular: peripheral pulses palpable.  Normal Capillary refill   Respiratory: breathing unlabored, chest expansion symmetric   Skin: no rash, no open wounds, no erythema  Psych: normal affect; mood stable  Neurologic: gait normal, sensation grossly intact in extremities  MSK:    Cervical Spine:  There is no tenderness to palpation along the cervical spine.  Range of motion is normal.  Spurling's is negative    Shoulder Exam:   Left shoulder exam range of motion 170/75/T6.  Patient reported pain with forward flexion greater than 110 degrees.  Positive pain without weakness displayed with Jobes and O'Brien's test.  Intact speeds and belly press test.  Negative swelling deformity or tenderness.    IMAGING:     XRAY:  3 views of the left shoulder show no acute bony abnormality.  Normal shoulder x-ray.    Radiographic findings reviewed with patient      ASSESSMENT   Left shoulder pain      PLAN  Today we discussed his left shoulder.  Patient reports onset of pain 1 week ago without a known injury.  Exam displays functional range of motion and strength.  X-rays reviewed showing no acute bony abnormality or advanced degenerative changes.  Conservative treatment options were discussed today.  Patient was provided with home exercises he can perform independently.  Patient is limited with taking oral NSAIDs and prednisone due to history of A-fib and diabetes.  He will continue with OTC acetaminophen as needed.  We discussed potential corticosteroid injection versus obtaining an MRI should symptoms fail to improve.  Patient verbalized understanding.        Roselyn Reef, APRN-CNP  Orthopaedic Surgery   10/08/23  3:23 PM

## 2023-10-11 ENCOUNTER — Inpatient Hospital Stay: Payer: PRIVATE HEALTH INSURANCE | Primary: Family Medicine

## 2023-10-11 DIAGNOSIS — E041 Nontoxic single thyroid nodule: Secondary | ICD-10-CM

## 2023-10-11 LAB — T4: Thyroxine (T4): 4.5 ug/dL (ref 4.5–11.7)

## 2023-10-11 LAB — TSH: TSH: 1.35 u[IU]/mL (ref 0.27–4.20)

## 2023-10-14 ENCOUNTER — Ambulatory Visit
Admit: 2023-10-14 | Discharge: 2023-10-14 | Payer: PRIVATE HEALTH INSURANCE | Attending: Facial Plastic Surgery | Primary: Family Medicine

## 2023-10-14 DIAGNOSIS — E890 Postprocedural hypothyroidism: Secondary | ICD-10-CM

## 2023-10-14 NOTE — Progress Notes (Signed)
[facility]  Dr. Jilda Panda D. Bayard Males, D.O. Ms.Ed  Post-Op Follow Up        Patient Name:  [facility].  DOB:  07-29-1971     CHIEF C/O:    Chief Complaint   Patient presents with    Follow-up     6wk w/ thyroid labs, no other complaints at this time        HISTORY OBTAINED FROM:  patient    HISTORY OF PRESENT ILLNESS:       Thomas [facility] is a 52 y.o. year old male, here today for follow up of:       Patient seen status post a hemithyroidectomy for large cystic right sided back goitrous nodule.  Patient admits to incision healing well. Patient has no new complaints of hoarseness difficulty swallowing. He denies new symptoms including heat and cold intolerance, energy changes, polydipsia and polyphagia.          Review of Systems   Constitutional:  Negative for chills and fever.   HENT:  Negative for ear discharge, hearing loss, trouble swallowing and voice change.    Respiratory:  Negative for cough and shortness of breath.    Cardiovascular:  Negative for chest pain and palpitations.   Gastrointestinal:  Negative for vomiting.   Endocrine: Negative for cold intolerance, heat intolerance, polydipsia and polyphagia.   Skin:  Negative for rash.   Allergic/Immunologic: Negative for environmental allergies.   Neurological:  Negative for dizziness and headaches.   Hematological:  Does not bruise/bleed easily.   All other systems reviewed and are negative.      Ht 1.93 m (6\' 4" )   Wt 133.6 kg (294 lb 9.6 oz)   BMI 35.86 kg/m   Physical Exam  Vitals and nursing note reviewed.   Constitutional:       Appearance: He is well-developed.   HENT:      Head: Normocephalic and atraumatic.      Right Ear: Tympanic membrane and ear canal normal.      Left Ear: Tympanic membrane and ear canal normal.      Nose: No congestion or rhinorrhea.   Eyes:      Pupils: Pupils are equal, round, and reactive to light.   Neck:      Thyroid: No thyromegaly.      Trachea: No tracheal deviation.      Comments: S/p thyroid  scar  Cardiovascular:      Rate and Rhythm: Normal rate.   Pulmonary:      Effort: Pulmonary effort is normal. No respiratory distress.   Musculoskeletal:         General: Normal range of motion.      Cervical back: Normal range of motion.   Lymphadenopathy:      Cervical: No cervical adenopathy.   Skin:     General: Skin is warm.      Findings: No erythema.   Neurological:      Mental Status: He is alert.      Cranial Nerves: No cranial nerve deficit.           IMPRESSION/PLAN:  1. History of partial thyroidectomy      - Patient Tsh and T4 within normal limits  - Recommend PCP follow thyroid labs in future  - Patient can follow up as needed. Instructed patient to return for new or changed symptoms.     Antony Contras, DO,  Resident Physician, PGY-1  Department of Otolaryngology  Head & Neck  Surgery  [employer]            Thomas Walton.  1971-10-20      I have discussed the case, including pertinent history and exam findings with the resident. I have seen and examined the patient and the key elements of the encounter have been performed by me.  I agree with the assessment, plan and orders as documented by the resident.      Patient here for follow up of medical problems.         Remainder of medical problems as per resident note.      Almyra Brace Jahan Friedlander, DO  10/21/23

## 2023-10-18 NOTE — Discharge Instructions (Signed)
DISCHARGE INSTRUCTIONS FOR TOTAL KNEE REPLACEMENT        Prevent blood clots - you are at risk post operatively for DVT (Deep vein    thrombosis and / or PE (Pulmonary Embolism)       Continue antiembolic stockings (TED hose) for 4 weeks from date of surgery.   Remove stockings every eight hours.   Check skin for redness or any breakdowns on heels and over outer ankle bones.   Do not roll stockings down or fold over (this may cause a band of constriction    interfering with circulation and increasing your risk of a blood clot forming or a    skin breakdown.   If you have not been wearing your stockings and notice increased swelling or    excessive swelling in your extremity then: Lie down and elevate legs for 20-30   minutes.  Apply stockings.  If swelling does not improve, call office.   REMEMBER: Mild to moderate swelling of the operative limb is expected    for some time after surgery.     Take frequent walks around  your home.   Be careful outdoors- watch for uneven ground and pavement, curbs and holes.   Gradually increase your activity to prevent fatigue.   When at rest (sitting in a chair or lying down,resting) continue circulation exercises at least every hour - foot flaps, ankle rolls, quad and glut sets.      You will continue one of the following blood thinners at home.   Lovenox -  An injection once or twice a day if you have a history of blood clots,    cancer, stomach ulcers or gastrointestinal bleeding.   Aspirin - 81 mg twice daily if no history of the above ailments.   Coumadin - if held before surgery, your family doctor will be responsible for   managing and ordering this medication upon your discharge.    Incision Care:   You may shower - Your dressing is water proof.  You can shower with your dressing on.  After one week, remove your dressing and leave your incision open to air.   REMEMBER to let water roll over incision. Incision line is    healing and tender - protect from HOT water. No need  to wash incision with soap   and water.  Pat incision dry with clean hand towel or let air dry.   DO NOT take baths, go in swimming pools/hot tubs/lakes, or submerge your operative leg under water until you are cleared by Dr. Letitia Libra.      Do not apply creams, liniments, lotions or ointments directly on incision line.   If incision is draining, keep covered with sterile gauze.  Change dressing daily and each time you shower.    Do not touch incision line with your fingers or scratch incision with your   fingernails (your fingernails harbor germs and bacteria).   Do not allow pets near your incision - do not allow pets to smell or lick incision.    Weight Bearing:   You can be weight bearing as tolerated on your operative leg.  Use a walker/cane as needed.        Signs and symptoms to report to your doctor:     Persistent drainage from the incision.   Increased redness or swelling of the incision or operative extremity.   Increased or excessive pain at the incision site or in the thigh or calf.   Fever  over 101 degrees with chills (take your temperature at home and   Record).    Go to Emergency Room if you experience any of the following:     Chest pain or tightness   Shortness of breath or difficulty breathing   Anxiety or feeling of impending doom      Note: The above are signs and symptoms of a blood clot in your    lungs.  Although this is rare, it can occur.  You must be evaluated in the   Emergency Room.    Rehabilitation:   Continue exercises at home as instructed by the Physical and Occupational   Therapists.   Continue Hip / Knee Precautions until cleared by Dr. Letitia Libra   Begin a formal Outpatient Physical Therapy program as soon as you are able.    First post op visit will include:              Wound check               X-ray of operative joint              Exam by your surgeon               Prescription for Outpatient Physical Therapy     Once you are home:   Call office for appointment at [phone]  for one week.      Dyanne Iha, MD  Orthopaedic Surgery

## 2023-10-21 ENCOUNTER — Encounter

## 2023-11-06 ENCOUNTER — Encounter
Admit: 2023-11-06 | Discharge: 2023-11-06 | Payer: PRIVATE HEALTH INSURANCE | Attending: Orthopaedic Surgery | Primary: Family Medicine

## 2023-11-06 DIAGNOSIS — Z01818 Encounter for other preprocedural examination: Secondary | ICD-10-CM

## 2023-11-06 NOTE — Progress Notes (Signed)
Department of Orthopedic Surgery  Attending Pre-operative History and Physical        DIAGNOSIS: Left knee osteoarthritis    INDICATION: Failed conservative treatment    PROCEDURE: Left Mako robotic assisted total knee arthroplasty    CHIEF COMPLAINT: Left knee pain    History Obtained From:  patient    HISTORY OF PRESENT ILLNESS:      The patient is a 52 y.o. male with significant past medical history of left knee osteoarthritis who presents with left knee pain.  Patient has failed conservative treatment including intra-articular injections, acute modification, home exercise, oral NSAIDs.  Weightbearing x-rays display advanced tricompartmental osteoarthritis of the left knee.  He continues to have persistent pain and stiffness affecting his daily activities.  For these reasons he wished to proceed with surgical management in the form of a left Mako robotic assisted total knee arthroplasty. The risks, benefits, and alternatives to the procedure were discussed at length with the patient and family members.  Risks include but are not limited to infection, neurovascular injury, persistent pain, arthrofibrosis, failure of hardware, need for revision surgery, pulmonary embolism, and the risks of general anesthesia. Patient verbalizes understanding of the risks and wishes to proceed.       Past Medical History:        Diagnosis Date    Arthritis     Left knee    Asthma     Diabetes (HCC)     HLD (hyperlipidemia)     HTN (hypertension)     OSA on CPAP     PAF (paroxysmal atrial fibrillation) (HCC)     Thyroid nodule     to have surgery in August 2024       Past Surgical History:        Procedure Laterality Date    ABDOMEN SURGERY Right 05/22/2022    EXCISION RIGHT CHEST WALL CYST performed by Lorene Dy, MD at [facility] OR    CARDIAC CATHETERIZATION  12/12/2020    THYROIDECTOMY Right 08/15/2023    HEMITHYROIDECTOMY performed by Johnsie Cancel, DO at [facility] OR       Medications Prior to Admission:   Not in a hospital  admission.    Allergies:  Shellfish-derived products    History of allergic reaction to anesthesia:  No    Social History:   TOBACCO:   reports that he has never smoked. He has never used smokeless tobacco.  ETOH:   reports current alcohol use.  DRUGS:   reports no history of drug use.    Family History:       Problem Relation Age of Onset    Heart Attack Father        REVIEW OF SYSTEMS:  CONSTITUTIONAL:  negative  RESPIRATORY:  negative  CARDIOVASCULAR:  negative  MUSCULOSKELETAL:  negative except for  HPI  NEUROLOGICAL:  negative    PHYSICAL EXAM:     VITALS:  BP 122/75 (Site: Right Upper Arm, Position: Sitting, Cuff Size: Large Adult)   Pulse 73   Temp 97.4 F (36.3 C) (Oral)   Ht 1.93 m (6\' 4" )   Wt 129.3 kg (285 lb)   SpO2 93%   BMI 34.69 kg/m     Gen: AOx3, NAD    Heart:  normal apical pulses, normal S1 and S2 and no edema    Lungs:  No increased work of breathing, good air exchange     Abdomen:  no scars, non-distended and non-tender    Extremities:  No clubbing, cyanosis, or edema    Musculoskeletal: Left knee exam displays full range of motion.  Stable valgus and varus stress testing.  Patella tracked midline.  Positive joint line tenderness.  Knee is grossly stable on exam.      DATA:  CBC:   Lab Results   Component Value Date/Time    WBC 4.1 08/15/2023 07:55 AM    RBC 4.96 08/15/2023 07:55 AM    HGB 14.2 08/15/2023 07:55 AM    HCT 43.0 08/15/2023 07:55 AM    MCV 86.7 08/15/2023 07:55 AM    MCH 28.6 08/15/2023 07:55 AM    MCHC 33.0 08/15/2023 07:55 AM    RDW 14.2 08/15/2023 07:55 AM    PLT 238 08/15/2023 07:55 AM    MPV 8.7 08/15/2023 07:55 AM     BMP:    Lab Results   Component Value Date/Time    NA 138 08/15/2023 07:55 AM    K 3.9 08/15/2023 07:55 AM    K 4.5 12/15/2021 08:34 PM    CL 105 08/15/2023 07:55 AM    CO2 22 08/15/2023 07:55 AM    BUN 11 08/15/2023 07:55 AM    CREATININE 1.0 08/15/2023 07:55 AM    CALCIUM 8.8 08/15/2023 07:55 AM    GFRAA >60 04/30/2021 06:38 PM    LABGLOM >90 08/15/2023  07:55 AM    LABGLOM >60 03/14/2023 10:37 AM    GLUCOSE 159 08/15/2023 07:55 AM       Radiology Review: X-ray including 4 views left knee display tricompartmental osteoarthritis    ASSESSMENT AND PLAN:    1.  Patient is a 52 y.o. male with above specified procedure planned left Mako robotic assisted total knee arthroplasty with anesthesia.    2.  Procedure options, risks and benefits reviewed with patient.  Patient expresses understanding and has signed consent form to proceed.    3.  Patient and family were provided with pre-op and post-op instructions, prescription medications, and any other supplied needed post op (slings, braces, etc.)      Controlled substances monitoring: possible medication side effects, risk of tolerance and/or dependence, and alternative treatments discussed, no signs of potential drug abuse or diversion identified, and OARRS report reviewed today- activity consistent with treatment plan.      Roselyn Reef, APRN-CNP  Orthopaedic Surgery   11/06/23  3:58 PM      Staff Addendum    I have seen and evaluated the patient and agree with the assessment and plan as documented by Roselyn Reef, CNP. I have performed the key components of the history and physical examination and concur with the findings and plan, and have made changes where appropriate/necessary.          Rhina Brackett, MD  [facility]

## 2023-11-13 ENCOUNTER — Encounter: Payer: PRIVATE HEALTH INSURANCE | Attending: Orthopaedic Surgery | Primary: Family Medicine

## 2023-11-19 ENCOUNTER — Ambulatory Visit
Admit: 2023-11-19 | Discharge: 2023-11-20 | Disposition: A | Discharge status: Home / Self Care | Payer: PRIVATE HEALTH INSURANCE | Source: Ambulatory Visit | Attending: Orthopaedic Surgery | Admitting: Orthopaedic Surgery | Primary: Family Medicine

## 2023-11-19 VITALS — BP 118/60 | HR 68 | Temp 97.40000°F | Resp 20 | Ht 76.0 in | Wt 285.0 lb

## 2023-11-19 DIAGNOSIS — Z01818 Encounter for other preprocedural examination: Secondary | ICD-10-CM

## 2023-11-19 LAB — CBC WITH AUTO DIFFERENTIAL
Basophils %: 1 % (ref 0.0–2.0)
Basophils Absolute: 0.04 10*3/uL (ref 0.00–0.20)
Eosinophils %: 2 % (ref 0–6)
Eosinophils Absolute: 0.11 10*3/uL (ref 0.05–0.50)
Hematocrit: 43.3 % (ref 37.0–54.0)
Hemoglobin: 14 g/dL (ref 12.5–16.5)
Immature Granulocytes %: 0 % (ref 0.0–5.0)
Immature Granulocytes Absolute: <0.03 10*3/uL (ref 0.00–0.58)
Lymphocytes %: 35 % (ref 20.0–42.0)
Lymphocytes Absolute: 1.6 10*3/uL (ref 1.50–4.00)
MCH: 28.2 pg (ref 26.0–35.0)
MCHC: 32.3 g/dL (ref 32.0–34.5)
MCV: 87.3 fL (ref 80.0–99.9)
MPV: 8.5 fL (ref 7.0–12.0)
Monocytes %: 9 % (ref 2.0–12.0)
Monocytes Absolute: 0.42 10*3/uL (ref 0.10–0.95)
Neutrophils %: 52 % (ref 43.0–80.0)
Neutrophils Absolute: 2.41 10*3/uL (ref 1.80–7.30)
Platelets: 228 10*3/uL (ref 130–450)
RBC: 4.96 m/uL (ref 3.80–5.80)
RDW: 14.3 % (ref 11.5–15.0)
WBC: 4.6 10*3/uL (ref 4.5–11.5)

## 2023-11-19 LAB — BASIC METABOLIC PANEL
Anion Gap: 10 mmol/L (ref 7–16)
BUN: 15 mg/dL (ref 6–20)
CO2: 26 mmol/L (ref 22–29)
Calcium: 9.3 mg/dL (ref 8.6–10.2)
Chloride: 105 mmol/L (ref 98–107)
Creatinine: 1.1 mg/dL (ref 0.70–1.20)
Est, Glom Filt Rate: >60 mL/min/{1.73_m2} (ref >60)
Glucose: 121 mg/dL — ABNORMAL HIGH (ref 74–99)
Potassium: 4.2 mmol/L (ref 3.5–5.0)
Sodium: 141 mmol/L (ref 132–146)

## 2023-11-19 LAB — HEMOGLOBIN A1C: Hemoglobin A1C: 7.8 % — ABNORMAL HIGH (ref 4.0–5.6)

## 2023-11-19 LAB — PREALBUMIN: Prealbumin: 23 mg/dL (ref 20.0–40.0)

## 2023-11-19 NOTE — Telephone Encounter (Signed)
 I have a pre-op form for patient, does he need to make an appt?

## 2023-11-19 NOTE — Discharge Instructions (Signed)
 Rudene Christians, RN  Registered Nurse     Progress Notes     Signed     Date of Service: 11/19/2023  8:00 AM     Signed         [facility] PRE-ADMISSION TESTING INSTRUCTIONS     The Preadmission Testing patient is instructed ac

## 2023-11-19 NOTE — Progress Notes (Signed)
 Pt aware medical clearance from PCP is needed for [facility] 11/26/23. He is messaging PCP during PAT visit.Marland KitchenMarland KitchenMarland Kitchen

## 2023-11-19 NOTE — Progress Notes (Signed)
 [facility] PRE-ADMISSION TESTING INSTRUCTIONS    The Preadmission Testing patient is instructed accordingly using the following criteria (check applicable):    ARRIVAL INSTRUCTIONS:  [x]  Parking the day of Surgery is located in t

## 2023-11-19 NOTE — Progress Notes (Signed)
 Surgical clearance document      Plans for left total knee by Dr. Letitia Libra 11/26/2023, does have preop testing scheduled for today, A1c lab does show A1c 7.8, he is on Jardiance which will need to be stopped 3 days prior to the surgery, and metformin.  Nor

## 2023-11-20 LAB — EKG 12-LEAD
Atrial Rate: 68 {beats}/min
P Axis: 36 degrees
P-R Interval: 166 ms
Q-T Interval: 420 ms
QRS Duration: 106 ms
QTc Calculation (Bazett): 446 ms
R Axis: -53 degrees
T Axis: -39 degrees
Ventricular Rate: 68 {beats}/min

## 2023-11-20 LAB — CULTURE, MRSA, SCREENING: Culture: NEGATIVE

## 2023-11-26 ENCOUNTER — Observation Stay: Admit: 2023-11-26 | Payer: PRIVATE HEALTH INSURANCE | Primary: Family Medicine

## 2023-11-26 ENCOUNTER — Observation Stay
Admission: RE | Admit: 2023-11-26 | Discharge: 2023-11-26 | Disposition: A | Discharge status: Home / Self Care | Payer: PRIVATE HEALTH INSURANCE | Admitting: Orthopaedic Surgery

## 2023-11-26 DIAGNOSIS — M1712 Unilateral primary osteoarthritis, left knee: Secondary | ICD-10-CM

## 2023-11-26 LAB — POCT GLUCOSE: POC Glucose: 133 mg/dL — ABNORMAL HIGH (ref 74–99)

## 2023-11-26 MED ORDER — DEXAMETHASONE SODIUM PHOSPHATE 20 MG/5ML IJ SOLN
20 | Freq: Once | INTRAMUSCULAR | Status: DC | PRN
Start: 2023-11-26 — End: 2023-11-26
  Administered 2023-11-26: 14:00:00 10 via INTRAVENOUS

## 2023-11-26 MED ORDER — VANCOMYCIN HCL 1 G IV SOLR
1 | INTRAVENOUS | Status: AC
Start: 2023-11-26 — End: ?

## 2023-11-26 MED ORDER — MIDAZOLAM HCL 2 MG/2ML IJ SOLN
2 | INTRAMUSCULAR | Status: AC
Start: 2023-11-26 — End: ?

## 2023-11-26 MED ORDER — HYDROMORPHONE HCL 1 MG/ML IJ SOLN
1 | INTRAMUSCULAR | Status: DC | PRN
Start: 2023-11-26 — End: 2023-11-26

## 2023-11-26 MED ORDER — OXYCODONE HCL 5 MG PO TABS
5 | ORAL | Status: DC | PRN
Start: 2023-11-26 — End: 2023-11-26

## 2023-11-26 MED ORDER — ONDANSETRON 4 MG PO TBDP
4 | Freq: Three times a day (TID) | ORAL | Status: DC | PRN
Start: 2023-11-26 — End: 2023-11-26

## 2023-11-26 MED ORDER — LIDOCAINE HCL 1% INJ (MIXTURES ONLY)
1 % | INTRAMUSCULAR | Status: DC | PRN
Start: 2023-11-26 — End: 2023-11-26
  Administered 2023-11-26: 16:00:00 60 via INTRAMUSCULAR

## 2023-11-26 MED ORDER — CEFAZOLIN SODIUM 2 G IJ SOLR
2 | INTRAMUSCULAR | Status: DC
Start: 2023-11-26 — End: 2023-11-26

## 2023-11-26 MED ORDER — TRANEXAMIC ACID-NACL 1000-0.7 MG/100ML-% IV SOLN
Freq: Once | INTRAVENOUS | Status: AC
Start: 2023-11-26 — End: 2023-11-26
  Administered 2023-11-26: 17:00:00 1000 mg via INTRAVENOUS

## 2023-11-26 MED ORDER — DEXAMETHASONE SOD PHOSPHATE PF 10 MG/ML IJ SOLN
10 | Freq: Once | INTRAMUSCULAR | Status: DC
Start: 2023-11-26 — End: 2023-11-26

## 2023-11-26 MED ORDER — CEFAZOLIN SODIUM 3 G SOLR (MIXTURES ONLY)
3 | Status: DC
Start: 2023-11-26 — End: 2023-11-26

## 2023-11-26 MED ORDER — ROPIVACAINE HCL 5 MG/ML IJ SOLN
INTRAMUSCULAR | Status: AC
Start: 2023-11-26 — End: 2023-11-26
  Administered 2023-11-26: 13:00:00 30 via PERINEURAL

## 2023-11-26 MED ORDER — METFORMIN HCL ER 500 MG PO TB24
500 | Freq: Every day | ORAL | Status: DC
Start: 2023-11-26 — End: 2023-11-26

## 2023-11-26 MED ORDER — PROCHLORPERAZINE EDISYLATE 10 MG/2ML IJ SOLN
10 | Freq: Once | INTRAMUSCULAR | Status: DC | PRN
Start: 2023-11-26 — End: 2023-11-26

## 2023-11-26 MED ORDER — NORMAL SALINE FLUSH 0.9 % IV SOLN
0.9 | Freq: Two times a day (BID) | INTRAVENOUS | Status: DC
Start: 2023-11-26 — End: 2023-11-26

## 2023-11-26 MED ORDER — LACTATED RINGERS IV SOLN
INTRAVENOUS | Status: DC | PRN
Start: 2023-11-26 — End: 2023-11-26
  Administered 2023-11-26: 15:00:00 via INTRAVENOUS

## 2023-11-26 MED ORDER — BUPIVACAINE IN DEXTROSE 0.75-8.25 % IT SOLN
Freq: Once | INTRATHECAL | Status: DC | PRN
Start: 2023-11-26 — End: 2023-11-26
  Administered 2023-11-26: 14:00:00 2

## 2023-11-26 MED ORDER — EMPAGLIFLOZIN 10 MG PO TABS
10 | Freq: Every day | ORAL | Status: DC
Start: 2023-11-26 — End: 2023-11-26

## 2023-11-26 MED ORDER — DEXAMETHASONE SODIUM PHOSPHATE 20 MG/5ML IJ SOLN
20 | INTRAMUSCULAR | Status: AC
Start: 2023-11-26 — End: ?

## 2023-11-26 MED ORDER — ALBUTEROL SULFATE HFA 108 (90 BASE) MCG/ACT IN AERS
108 | Freq: Four times a day (QID) | RESPIRATORY_TRACT | Status: DC | PRN
Start: 2023-11-26 — End: 2023-11-26

## 2023-11-26 MED ORDER — MIDAZOLAM HCL (PF) 2 MG/2ML IJ SOLN
2 | INTRAMUSCULAR | Status: DC | PRN
Start: 2023-11-26 — End: 2023-11-26
  Administered 2023-11-26: 13:00:00 2 mg via INTRAVENOUS

## 2023-11-26 MED ORDER — ONDANSETRON HCL 4 MG/2ML IJ SOLN
4 | INTRAMUSCULAR | Status: AC
Start: 2023-11-26 — End: ?

## 2023-11-26 MED ORDER — MIDAZOLAM HCL 2 MG/2ML IJ SOLN
2 | Freq: Once | INTRAMUSCULAR | Status: DC | PRN
Start: 2023-11-26 — End: 2023-11-26
  Administered 2023-11-26: 14:00:00 1 via INTRAVENOUS

## 2023-11-26 MED ORDER — SODIUM CHLORIDE 0.9 % IV SOLN
0.9 | INTRAVENOUS | Status: DC | PRN
Start: 2023-11-26 — End: 2023-11-26

## 2023-11-26 MED ORDER — BUPIVACAINE IN DEXTROSE 0.75-8.25 % IT SOLN
INTRATHECAL | Status: DC
Start: 2023-11-26 — End: 2023-11-26
  Administered 2023-11-26: 14:00:00 15 via INTRATHECAL

## 2023-11-26 MED ORDER — NORMAL SALINE FLUSH 0.9 % IV SOLN
0.9 | INTRAVENOUS | Status: DC | PRN
Start: 2023-11-26 — End: 2023-11-26

## 2023-11-26 MED ORDER — LIDOCAINE HCL (PF) 2 % IJ SOLN
2 | INTRAMUSCULAR | Status: AC
Start: 2023-11-26 — End: ?

## 2023-11-26 MED ORDER — PROPOFOL 500 MG/50ML IV EMUL
500 | INTRAVENOUS | Status: AC
Start: 2023-11-26 — End: ?

## 2023-11-26 MED ORDER — FENTANYL CITRATE (PF) 100 MCG/2ML IJ SOLN
100 | INTRAMUSCULAR | Status: AC
Start: 2023-11-26 — End: ?

## 2023-11-26 MED ORDER — OXYCODONE-ACETAMINOPHEN 5-325 MG PO TABS
5-325 MG | ORAL_TABLET | Freq: Four times a day (QID) | ORAL | 0 refills | Status: DC | PRN
Start: 2023-11-26 — End: 2023-12-04
  Filled 2023-11-26: qty 28, 7d supply, fill #0

## 2023-11-26 MED ORDER — RIVAROXABAN 20 MG PO TABS
20 | Freq: Every day | ORAL | Status: DC
Start: 2023-11-26 — End: 2023-11-26

## 2023-11-26 MED ORDER — KETOROLAC TROMETHAMINE 30 MG/ML IJ SOLN
30 | Freq: Four times a day (QID) | INTRAMUSCULAR | Status: DC
Start: 2023-11-26 — End: 2023-11-26
  Administered 2023-11-26: 18:00:00 15 mg via INTRAVENOUS

## 2023-11-26 MED ORDER — VANCOMYCIN HCL 1 G IV SOLR
1 | INTRAVENOUS | Status: DC | PRN
Start: 2023-11-26 — End: 2023-11-26
  Administered 2023-11-26: 15:00:00 1000 via TOPICAL

## 2023-11-26 MED ORDER — SODIUM CHLORIDE (PF) 0.9 % IJ SOLN
0.9 | INTRAMUSCULAR | Status: DC | PRN
Start: 2023-11-26 — End: 2023-11-26

## 2023-11-26 MED ORDER — KETOROLAC TROMETHAMINE 10 MG PO TABS
10 | ORAL_TABLET | Freq: Four times a day (QID) | ORAL | 0 refills | Status: AC
Start: 2023-11-26 — End: 2023-11-28
  Filled 2023-11-26: qty 8, 2d supply, fill #0

## 2023-11-26 MED ORDER — PROPOFOL 200 MG/20ML IV EMUL
200 | INTRAVENOUS | Status: AC
Start: 2023-11-26 — End: ?

## 2023-11-26 MED ORDER — ROCURONIUM BROMIDE 50 MG/5ML IV SOLN
50 | INTRAVENOUS | Status: AC
Start: 2023-11-26 — End: ?

## 2023-11-26 MED ORDER — TRANEXAMIC ACID-NACL 1000-0.7 MG/100ML-% IV SOLN
INTRAVENOUS | Status: AC
Start: 2023-11-26 — End: 2023-11-26
  Administered 2023-11-26: 14:00:00 1000 mg via INTRAVENOUS

## 2023-11-26 MED ORDER — SODIUM CHLORIDE 0.9 % IV SOLN
0.9 | INTRAVENOUS | Status: DC | PRN
Start: 2023-11-26 — End: 2023-11-26
  Administered 2023-11-26: 14:00:00 via INTRAVENOUS

## 2023-11-26 MED ORDER — CELECOXIB 100 MG PO CAPS
100 | Freq: Once | ORAL | Status: AC
Start: 2023-11-26 — End: 2023-11-26
  Administered 2023-11-26: 12:00:00 200 mg via ORAL

## 2023-11-26 MED ORDER — CEFAZOLIN SODIUM 3 G IV SOLR
3 | INTRAVENOUS | Status: AC
Start: 2023-11-26 — End: 2023-11-26
  Administered 2023-11-26: 14:00:00 3000 mg via INTRAVENOUS

## 2023-11-26 MED ORDER — ACETAMINOPHEN 500 MG PO TABS
500 | Freq: Once | ORAL | Status: AC
Start: 2023-11-26 — End: 2023-11-26
  Administered 2023-11-26: 12:00:00 1000 mg via ORAL

## 2023-11-26 MED ORDER — BUPIVACAINE HCL (PF) 0.25 % IJ SOLN
0.25 | INTRAMUSCULAR | Status: AC
Start: 2023-11-26 — End: ?

## 2023-11-26 MED ORDER — FENTANYL CITRATE (PF) 100 MCG/2ML IJ SOLN
100 | Freq: Once | INTRAMUSCULAR | Status: DC | PRN
Start: 2023-11-26 — End: 2023-11-26
  Administered 2023-11-26: 14:00:00 25 via INTRAVENOUS

## 2023-11-26 MED ORDER — ATORVASTATIN CALCIUM 40 MG PO TABS
40 | Freq: Every evening | ORAL | Status: DC
Start: 2023-11-26 — End: 2023-11-26

## 2023-11-26 MED ORDER — ACETAMINOPHEN 325 MG PO TABS
325 | Freq: Four times a day (QID) | ORAL | Status: DC
Start: 2023-11-26 — End: 2023-11-26
  Administered 2023-11-26: 18:00:00 650 mg via ORAL

## 2023-11-26 MED ORDER — STERILE WATER FOR INJECTION IJ SOLN
INTRAMUSCULAR | Status: DC
Start: 2023-11-26 — End: 2023-11-26

## 2023-11-26 MED ORDER — ROPIVACAINE HCL 5 MG/ML IJ SOLN
Freq: Once | INTRAMUSCULAR | Status: DC | PRN
Start: 2023-11-26 — End: 2023-11-26

## 2023-11-26 MED ORDER — VITAMIN D (ERGOCALCIFEROL) 1.25 MG (50000 UT) PO CAPS
1.25 | ORAL | Status: DC
Start: 2023-11-26 — End: 2023-11-26

## 2023-11-26 MED ORDER — LOSARTAN POTASSIUM 50 MG PO TABS
50 | Freq: Every day | ORAL | Status: DC
Start: 2023-11-26 — End: 2023-11-26

## 2023-11-26 MED ORDER — FENTANYL CITRATE (PF) 100 MCG/2ML IJ SOLN
100 | Freq: Once | INTRAMUSCULAR | Status: DC | PRN
Start: 2023-11-26 — End: 2023-11-26
  Administered 2023-11-26: 14:00:00 25 via INTRASPINAL

## 2023-11-26 MED ORDER — ONDANSETRON HCL 4 MG/2ML IJ SOLN
4 | Freq: Once | INTRAMUSCULAR | Status: DC | PRN
Start: 2023-11-26 — End: 2023-11-26
  Administered 2023-11-26: 14:00:00 4 via INTRAVENOUS

## 2023-11-26 MED ORDER — LIDOCAINE HCL (PF) 2 % IJ SOLN
2 | Freq: Once | INTRAMUSCULAR | Status: DC | PRN
Start: 2023-11-26 — End: 2023-11-26
  Administered 2023-11-26: 14:00:00 60 via INTRAVENOUS

## 2023-11-26 MED ORDER — METOPROLOL SUCCINATE ER 50 MG PO TB24
50 | Freq: Two times a day (BID) | ORAL | Status: DC
Start: 2023-11-26 — End: 2023-11-26

## 2023-11-26 MED ORDER — CEFAZOLIN SODIUM 3 G SOLR (MIXTURES ONLY)
3 | Freq: Three times a day (TID) | Status: DC
Start: 2023-11-26 — End: 2023-11-26

## 2023-11-26 MED ORDER — LIDOCAINE-EPINEPHRINE 1 %-1:100000 IJ SOLN
1 | INTRAMUSCULAR | Status: AC
Start: 2023-11-26 — End: ?

## 2023-11-26 MED ORDER — ONDANSETRON HCL 4 MG/2ML IJ SOLN
4 | Freq: Four times a day (QID) | INTRAMUSCULAR | Status: DC | PRN
Start: 2023-11-26 — End: 2023-11-26

## 2023-11-26 MED ORDER — LIDOCAINE HCL 1 % IJ SOLN
1 | Freq: Once | INTRAMUSCULAR | Status: DC | PRN
Start: 2023-11-26 — End: 2023-11-26

## 2023-11-26 MED ORDER — PROPOFOL 500 MG/50ML IV EMUL
500 | INTRAVENOUS | Status: DC | PRN
Start: 2023-11-26 — End: 2023-11-26
  Administered 2023-11-26: 14:00:00 100 via INTRAVENOUS

## 2023-11-26 MED ORDER — FENTANYL CITRATE (PF) 100 MCG/2ML IJ SOLN
100 | Freq: Once | INTRAMUSCULAR | Status: AC
Start: 2023-11-26 — End: 2023-11-26
  Administered 2023-11-26: 13:00:00 50 ug via INTRAVENOUS

## 2023-11-26 MED ORDER — LIDOCAINE HCL 1 % IJ SOLN
1 | INTRAMUSCULAR | Status: AC
Start: 2023-11-26 — End: ?

## 2023-11-26 MED ORDER — OXYCODONE HCL 5 MG PO TABS
5 | ORAL | Status: DC | PRN
Start: 2023-11-26 — End: 2023-11-26
  Administered 2023-11-26: 19:00:00 5 mg via ORAL

## 2023-11-26 MED FILL — TRANEXAMIC ACID-NACL 1000-0.7 MG/100ML-% IV SOLN: INTRAVENOUS | Qty: 100

## 2023-11-26 MED FILL — FENTANYL CITRATE (PF) 100 MCG/2ML IJ SOLN: 100 MCG/2ML | INTRAMUSCULAR | Qty: 2

## 2023-11-26 MED FILL — ROCURONIUM BROMIDE 50 MG/5ML IV SOLN: 50 MG/5ML | INTRAVENOUS | Qty: 5

## 2023-11-26 MED FILL — DEXAMETHASONE SODIUM PHOSPHATE 20 MG/5ML IJ SOLN: 20 MG/5ML | INTRAMUSCULAR | Qty: 5

## 2023-11-26 MED FILL — NAROPIN 5 MG/ML IJ SOLN: 5 MG/ML | INTRAMUSCULAR | Qty: 30

## 2023-11-26 MED FILL — DIPRIVAN 500 MG/50ML IV EMUL: 500 MG/50ML | INTRAVENOUS | Qty: 50

## 2023-11-26 MED FILL — LIDOCAINE HCL 1 % IJ SOLN: 1 % | INTRAMUSCULAR | Qty: 20

## 2023-11-26 MED FILL — BUPIVACAINE HCL (PF) 0.25 % IJ SOLN: 0.25 % | INTRAMUSCULAR | Qty: 20

## 2023-11-26 MED FILL — VANCOMYCIN HCL 1 G IV SOLR: 1 g | INTRAVENOUS | Qty: 1000

## 2023-11-26 MED FILL — MIDAZOLAM HCL 2 MG/2ML IJ SOLN: 2 MG/ML | INTRAMUSCULAR | Qty: 2

## 2023-11-26 MED FILL — XYLOCAINE-MPF 2 % IJ SOLN: 2 % | INTRAMUSCULAR | Qty: 5

## 2023-11-26 MED FILL — OXYCODONE HCL 5 MG PO TABS: 5 MG | ORAL | Qty: 1

## 2023-11-26 MED FILL — ACETAMINOPHEN EXTRA STRENGTH 500 MG PO TABS: 500 MG | ORAL | Qty: 2

## 2023-11-26 MED FILL — DIPRIVAN 200 MG/20ML IV EMUL: 200 MG/20ML | INTRAVENOUS | Qty: 20

## 2023-11-26 MED FILL — STERILE WATER FOR INJECTION IJ SOLN: INTRAMUSCULAR | Qty: 10

## 2023-11-26 MED FILL — CELECOXIB 100 MG PO CAPS: 100 MG | ORAL | Qty: 2

## 2023-11-26 MED FILL — CEFAZOLIN SODIUM 3 G IV SOLR: 3 g | INTRAVENOUS | Qty: 3000

## 2023-11-26 MED FILL — CEFAZOLIN SODIUM 2 G IJ SOLR: 2 g | INTRAMUSCULAR | Qty: 2000

## 2023-11-26 MED FILL — LIDOCAINE-EPINEPHRINE 1 %-1:100000 IJ SOLN: 1 %-:00000 | INTRAMUSCULAR | Qty: 20

## 2023-11-26 MED FILL — ONDANSETRON HCL 4 MG/2ML IJ SOLN: 4 MG/2ML | INTRAMUSCULAR | Qty: 2

## 2023-11-26 NOTE — Care Coordination-Inpatient (Signed)
 Met with patient in ortho class. Lives with significant other in 2 story home. Bed on 2nd floor, full bath on 1st floor. 14 steps/rails to bed. Ordered wheeled walker through [employer]-WILL BE DELIVERED AT 1 PM. Standard commode and tub shower. Plan is home wi

## 2023-11-26 NOTE — Anesthesia Pre-Procedure Evaluation (Signed)
 Department of Anesthesiology  Preprocedure Note       Name:  Thomas Walton.   Age:  52 y.o.  DOB:  Apr 01, 1971                                          MRN:  [id number]         Date:  11/26/2023      Surgeon: Moishe Spice):  Rhina Brackett, MD    Proced

## 2023-11-26 NOTE — Anesthesia Procedure Notes (Signed)
 Spinal Block    Patient location during procedure: OR  End time: 11/26/2023 8:44 AM  Reason for block: primary anesthetic and at surgeon's request  Staffing  Performed: resident/CRNA   Anesthesiologist: Veronda Prude, DO  Resident/CRNA: Randalyn Rhea,

## 2023-11-26 NOTE — H&P (Signed)
 Department of Orthopedic Surgery  Attending Pre-operative History and Physical        DIAGNOSIS: Left knee osteoarthritis    INDICATION: Failed conservative treatment    PROCEDURE: Left Mako robotic assisted total knee arthroplasty    CHIEF COMPLAINT: Left

## 2023-11-26 NOTE — Progress Notes (Signed)
 Physical Therapy  Facility/Department: [facility] OR  Physical Therapy Initial Assessment    Name: Thomas Walton.  DOB: 11-Aug-1971  MRN: [id number]  Date of Service: 11/26/2023    Attending Provider:  Rhina Brackett, MD    Evaluating PT:  Moises Blood. Jennipher Weatherholtz

## 2023-11-26 NOTE — Progress Notes (Signed)
 Pt admitted to PACU 1 from OR.  Placed on monitor, RA, VSS.

## 2023-11-26 NOTE — Telephone Encounter (Signed)
 Name of Medication(s) Requested:  Requested Prescriptions     Pending Prescriptions Disp Refills    XARELTO 20 MG TABS tablet 90 tablet 0     Sig: Take 1 tablet by mouth daily       Medication is on current medication list Yes    Dosage and directions were

## 2023-11-26 NOTE — Progress Notes (Signed)
Left adductor canal block complete. Patient tolerated well. Family called to bedside.

## 2023-11-26 NOTE — Op Note (Signed)
 OPERATIVE REPORT    PATIENT:  Thomas Walton  [id number]    DATE OF PROCEDURE:  11/26/23    SURGEON: Rhina Brackett, MD    ASSISTANT:  Allene Pyo, DO, [place] Ramage, DO, residents assisting     PREOPERATIVE DIAGNOSIS: Degenerative joint disease , Left

## 2023-11-26 NOTE — Anesthesia Procedure Notes (Signed)
 Peripheral Block    Patient location during procedure: pre-op  Reason for block: post-op pain management and at surgeon's request  Start time: 11/26/2023 7:46 AM  End time: 11/26/2023 7:51 AM  Staffing  Performed: anesthesiologist   Anesthesiologist: Lilla Shook

## 2023-11-26 NOTE — Anesthesia Post-Procedure Evaluation (Signed)
 Department of Anesthesiology  Postprocedure Note    Patient: Thomas Walton.  MRN: [id number]  Birthdate: [birth year]  Date of evaluation: 11/26/2023    Procedure Summary       Date: 11/26/23 Room / Location: [facility] OR 04 / [employer] [address]

## 2023-11-27 MED ORDER — XARELTO 20 MG PO TABS
20 | ORAL_TABLET | Freq: Every day | ORAL | 3 refills | Status: DC
Start: 2023-11-27 — End: 2024-11-27

## 2023-11-28 ENCOUNTER — Inpatient Hospital Stay
Admit: 2023-11-28 | Discharge: 2023-11-28 | Disposition: A | Discharge status: Home / Self Care | Payer: PRIVATE HEALTH INSURANCE | Attending: Emergency Medicine | Admitting: Emergency Medicine

## 2023-11-28 DIAGNOSIS — Z5189 Encounter for other specified aftercare: Secondary | ICD-10-CM

## 2023-11-28 LAB — CBC WITH AUTO DIFFERENTIAL
Basophils %: 1 % (ref 0.0–2.0)
Basophils Absolute: 0.04 10*3/uL (ref 0.00–0.20)
Eosinophils %: 1 % (ref 0–6)
Eosinophils Absolute: 0.04 10*3/uL — ABNORMAL LOW (ref 0.05–0.50)
Hematocrit: 35 % — ABNORMAL LOW (ref 37.0–54.0)
Hemoglobin: 11.3 g/dL — ABNORMAL LOW (ref 12.5–16.5)
Immature Granulocytes %: 0 % (ref 0.0–5.0)
Immature Granulocytes Absolute: 0.03 10*3/uL (ref 0.00–0.58)
Lymphocytes %: 23 % (ref 20.0–42.0)
Lymphocytes Absolute: 1.92 10*3/uL (ref 1.50–4.00)
MCH: 28.4 pg (ref 26.0–35.0)
MCHC: 32.3 g/dL (ref 32.0–34.5)
MCV: 87.9 fL (ref 80.0–99.9)
MPV: 8.8 fL (ref 7.0–12.0)
Monocytes %: 11 % (ref 2.0–12.0)
Monocytes Absolute: 0.91 10*3/uL (ref 0.10–0.95)
Neutrophils %: 64 % (ref 43.0–80.0)
Neutrophils Absolute: 5.27 10*3/uL (ref 1.80–7.30)
Platelets: 207 10*3/uL (ref 130–450)
RBC: 3.98 m/uL (ref 3.80–5.80)
RDW: 14.4 % (ref 11.5–15.0)
WBC: 8.2 10*3/uL (ref 4.5–11.5)

## 2023-11-28 LAB — BASIC METABOLIC PANEL
Anion Gap: 8 mmol/L (ref 7–16)
BUN: 13 mg/dL (ref 6–20)
CO2: 28 mmol/L (ref 22–29)
Calcium: 9 mg/dL (ref 8.6–10.2)
Chloride: 100 mmol/L (ref 98–107)
Creatinine: 1.2 mg/dL (ref 0.70–1.20)
Est, Glom Filt Rate: 71 mL/min/{1.73_m2} (ref >60)
Glucose: 118 mg/dL — ABNORMAL HIGH (ref 74–99)
Potassium: 4.1 mmol/L (ref 3.5–5.0)
Sodium: 136 mmol/L (ref 132–146)

## 2023-11-28 LAB — PROTIME-INR
INR: 1.4
Protime: 15 s — ABNORMAL HIGH (ref 9.3–12.4)

## 2023-11-28 LAB — POCT GLUCOSE: POC Glucose: 94 mg/dL (ref 74–99)

## 2023-11-28 LAB — APTT: APTT: 34.1 s (ref 24.5–35.1)

## 2023-11-28 MED ORDER — OXYCODONE-ACETAMINOPHEN 5-325 MG PO TABS
5-325 | Freq: Once | ORAL | Status: AC
Start: 2023-11-28 — End: 2023-11-28
  Administered 2023-11-28: 20:00:00 1 via ORAL

## 2023-11-28 MED ORDER — OXYCODONE-ACETAMINOPHEN 5-325 MG PO TABS
5-325 | Freq: Once | ORAL | Status: AC
Start: 2023-11-28 — End: 2023-11-28
  Administered 2023-11-28: 17:00:00 1 via ORAL

## 2023-11-28 MED FILL — OXYCODONE-ACETAMINOPHEN 5-325 MG PO TABS: 5-325 MG | ORAL | Qty: 1

## 2023-11-28 NOTE — Progress Notes (Signed)
 Left leg elevated up on pillow ice pack applied- awaiting ortho residents

## 2023-11-28 NOTE — ED Notes (Signed)
Ortho in room at this time.

## 2023-11-28 NOTE — Consults (Signed)
 Department of Orthopedic Trauma Surgery  Resident consult note      CHIEF COMPLAINT:   Chief Complaint   Patient presents with    Knee Problem     Pt had left knee replacement on 11/26/23-incisional bleeding noted. Aaox4.        HISTORY OF PRESENT ILLNESS:

## 2023-11-28 NOTE — ED Provider Notes (Signed)
 HPI:  11/28/23, Time: 11:09 AM EST         Thomas Walton. is a 52 y.o. male presenting to the ED for postop hemorrhage.  Patient status post total knee arthroplasty 2 days ago.  He says last night it began to bleed, the dressing was mildly saturat

## 2023-11-29 NOTE — Telephone Encounter (Signed)
 11/29/2023 @ 930    Visit type: Wound check , dressing change    Patient was accompanied by Misty Stanley (EC) .   Patient was brought to office in wheelchair.  Patient was assisted onto exam table by myself and EC.  ACE wrap of left leg was partically saturated an

## 2023-11-29 NOTE — Telephone Encounter (Signed)
 Spoke to [facility], she states there is no evidence of bleeding thru the ACE or ABD pads that were placed this morning.

## 2023-11-29 NOTE — Telephone Encounter (Signed)
 Patient EC called office, was seen in Emergency department yesterday for bandage saturation. Dressings were removed, glue and steri stripped.      EC states bandages are saturated again. Spoke to Dr Letitia Libra, will bring in for wound check today.    Called

## 2023-12-02 NOTE — Telephone Encounter (Signed)
 Thomas Walton - Home PT - minimal bleeding - today she states only a pin drop of blood on ABD pad. Swelling decreased.

## 2023-12-02 NOTE — Telephone Encounter (Signed)
 Patient's fiance called the after hours line reporting patient was feeling lethargic throughout the day. He has been complaining of intermittent chills with temp of 101.5 despite taking tylenol. Fiance reports increased stiffness to post op knee, but denie

## 2023-12-02 NOTE — Telephone Encounter (Signed)
 Patient states that minimal to no bleeding. Continues to do dry dressings. He will return to taking blood thinners tomorrow morning , if anything changes will call the office.

## 2023-12-03 NOTE — Telephone Encounter (Signed)
 Appointment was moved to 12/04/2023

## 2023-12-03 NOTE — Telephone Encounter (Addendum)
 100.2 currently - he states that he is feeling better than last night.    He states that took Tyl, fever broke - went to normal and did not feel the need to go to emergency room for evaluation. Patient states leg feels tight to bend. He denies any redness

## 2023-12-04 ENCOUNTER — Ambulatory Visit: Admit: 2023-12-04 | Payer: PRIVATE HEALTH INSURANCE | Primary: Family Medicine

## 2023-12-04 ENCOUNTER — Encounter: Admit: 2023-12-04 | Payer: PRIVATE HEALTH INSURANCE | Admitting: Orthopaedic Surgery | Primary: Family Medicine

## 2023-12-04 VITALS — BP 164/90 | HR 98 | Temp 97.80000°F | Resp 18 | Ht 75.0 in | Wt 285.0 lb

## 2023-12-04 DIAGNOSIS — M1712 Unilateral primary osteoarthritis, left knee: Secondary | ICD-10-CM

## 2023-12-04 DIAGNOSIS — Z96652 Presence of left artificial knee joint: Principal | ICD-10-CM

## 2023-12-04 LAB — SURGICAL PATHOLOGY REPORT

## 2023-12-04 MED ORDER — OXYCODONE-ACETAMINOPHEN 5-325 MG PO TABS
5-325 | ORAL_TABLET | Freq: Four times a day (QID) | ORAL | 0 refills | Status: AC | PRN
Start: 2023-12-04 — End: 2023-12-11

## 2023-12-04 NOTE — Progress Notes (Signed)
 [facility]   ORTHOPAEDIC SURGERY AND SPORTS MEDICINE  DATE OF VISIT: 12/04/23  Follow Up Post Operative Visit     CHIEF COMPLAINT:   Chief Complaint   Patient presents with    Post-Op Check     Pt is here POD# 8 for a mako robot assisted left total knee

## 2023-12-06 ENCOUNTER — Encounter: Payer: PRIVATE HEALTH INSURANCE | Attending: Orthopaedic Surgery | Primary: Family Medicine

## 2023-12-10 ENCOUNTER — Encounter: Admit: 2023-12-10 | Payer: PRIVATE HEALTH INSURANCE | Admitting: Family Medicine | Primary: Family Medicine

## 2023-12-10 VITALS — BP 134/87 | HR 94 | Temp 96.50000°F | Resp 17 | Ht 75.0 in | Wt 281.0 lb

## 2023-12-10 DIAGNOSIS — E119 Type 2 diabetes mellitus without complications: Secondary | ICD-10-CM

## 2023-12-10 MED ORDER — VITAMIN D (ERGOCALCIFEROL) 1.25 MG (50000 UT) PO CAPS
1.25 | ORAL_CAPSULE | ORAL | 4 refills | Status: DC
Start: 2023-12-10 — End: 2024-03-17

## 2023-12-10 NOTE — Progress Notes (Signed)
 Thomas Walton. (DOB:  December 16, 1971) is a 52 y.o. male, established patient follow up , here for evaluation of the following:  Diabetes      Assessment & Plan   ASSESSMENT/PLAN  In addition to below he also did have right thyroidectomy, repeat thyroid l

## 2023-12-19 ENCOUNTER — Encounter: Admit: 2023-12-19

## 2023-12-19 ENCOUNTER — Ambulatory Visit: Admit: 2023-12-19 | Payer: PRIVATE HEALTH INSURANCE | Primary: Family Medicine

## 2023-12-19 DIAGNOSIS — Z96652 Presence of left artificial knee joint: Secondary | ICD-10-CM

## 2024-01-10 ENCOUNTER — Ambulatory Visit
Admit: 2024-01-10 | Discharge: 2024-01-10 | Payer: PRIVATE HEALTH INSURANCE | Attending: Orthopaedic Surgery | Primary: Family Medicine

## 2024-01-10 NOTE — Progress Notes (Signed)
[facility]   ORTHOPAEDIC SURGERY AND SPORTS MEDICINE  DATE OF VISIT: 01/10/24      Follow Up Post Operative Visit     CHIEF COMPLAINT:   Chief Complaint   Patient presents with    Follow Up After Procedure     Surgery: Mako Robot Assisted Left total knee arthroplasty   Date: 11/26/2023      Post-Op Check     6 weeks post op     Pain     States 5/10 - tightness in the thigh - scar looks well approximate and great       Surgery: Left Mako robotic assisted total knee arthroplasty  Date: 11/26/2023    Subjective:    Thomas Walton. is here for follow up visit s/p above procedure.  He is doing well.  He has been progressing with PT.    Controlled Substances Monitoring:        Physical Exam:    Height: 1.905 m (6\' 3" ), BP: (!) 157/90 (just took BP med, no s/s)    General: Alert and oriented x3, no acute distress  Cardiovascular/pulmonary: No labored breathing, peripheral perfusion intact  Musculoskeletal:    Left Knee  Incision site is healed mature scar present.  Neurovascular sensation gross intact. Gait stable.  Range of motion 5-95.  Stable valgus and varus stress testing.    Imaging: reviewed    Assessment and Plan: Status post left mako robotic assisted total Knee arthroplasty    He is 6 weeks out, doing well.  There was concern about stiffness with PT however he has about 0 to 100 degrees of motion.  I think he will continue to improve with continued PT.  We will see him back in about 1 month rather than 6 weeks just to make sure his motion is still improving and that he does not require manipulation.  They are in agreement with this plan

## 2024-01-16 MED ORDER — LOSARTAN POTASSIUM 50 MG PO TABS
50 | ORAL_TABLET | Freq: Every day | ORAL | 3 refills | Status: DC
Start: 2024-01-16 — End: 2024-10-09

## 2024-01-16 NOTE — Telephone Encounter (Signed)
Name of Medication(s) Requested:  Requested Prescriptions     Pending Prescriptions Disp Refills    losartan (COZAAR) 50 MG tablet 90 tablet 3     Sig: Take 1 tablet by mouth daily       Medication is on current medication list Yes    Dosage and directions were verified? Yes    Quantity verified: 90 day supply     Pharmacy Verified?  Yes    Last Appointment:  12/10/2023    Future appts:  Future Appointments   Date Time Provider Department Center   02/03/2024  8:30 AM Colaluca, Roseanna Rainbow, APRN - CNP [facility]   03/17/2024  8:00 AM Vickey Huger, MD [facility] DE[facility]        (If no appt send self scheduling link. Marland KitchenREFILLAPPT)  Scheduling request sent?     []  Yes  [x]  No    Does patient need updated?  []  Yes  [x]  No

## 2024-02-03 ENCOUNTER — Encounter: Payer: PRIVATE HEALTH INSURANCE | Attending: Adult Health | Primary: Family Medicine

## 2024-02-03 DIAGNOSIS — Z96652 Presence of left artificial knee joint: Secondary | ICD-10-CM

## 2024-02-03 NOTE — Telephone Encounter (Signed)
 Lm regarding rescheduling cancelled appointment.

## 2024-02-04 ENCOUNTER — Encounter

## 2024-02-04 MED ORDER — DOXYCYCLINE HYCLATE 100 MG PO TABS
100 | ORAL_TABLET | Freq: Two times a day (BID) | ORAL | 0 refills | Status: AC
Start: 2024-02-04 — End: 2024-02-18

## 2024-02-04 NOTE — Progress Notes (Signed)
[facility]ph[facility], expedited partner treatment, will retest in 6 to 8 weeks

## 2024-02-11 ENCOUNTER — Ambulatory Visit: Admit: 2024-02-11 | Payer: PRIVATE HEALTH INSURANCE | Primary: Family Medicine

## 2024-02-11 ENCOUNTER — Ambulatory Visit
Admit: 2024-02-11 | Discharge: 2024-02-11 | Payer: PRIVATE HEALTH INSURANCE | Attending: Adult Health | Primary: Family Medicine

## 2024-02-11 VITALS — BP 148/94 | HR 66 | Temp 97.20000°F | Resp 18 | Ht 75.0 in | Wt 274.0 lb

## 2024-02-11 DIAGNOSIS — Z96652 Presence of left artificial knee joint: Secondary | ICD-10-CM

## 2024-02-11 NOTE — Progress Notes (Signed)
[facility]  ORTHOPAEDICS AND SPORTS MEDICINE  DATE OF VISIT: 02/11/24  Follow Up Post Operative Visit     Follow Up Post Operative Visit     CHIEF COMPLAINT:   Chief Complaint   Patient presents with    Post-Op Check     Pt is  here almost 3 months out from a mako robot assisted left total knee arthroplasty. Overall doing well, pt states his knee is still painful at times.        Surgery: Left Mako robotic assisted total knee arthroplasty  Date: 11/26/2023       Subjective:    Thomas Walton. is here for follow up visit s/p above procedure.  He is doing well.  He has been attending PT 3 times a week.  Has been tolerating job duties at work well.  Has noticed improvement overall in symptoms and range of motion.  Does report stiffness for Thomas Walton in the morning when waking up.    Controlled Substances Monitoring:        Physical Exam:    Height: 1.905 m (6\' 3" ), Weight - Scale: 124.3 kg (274 lb), BP: (!) 148/94 (pt states white coat syndrome)    General: Alert and oriented x3, no acute distress  Cardiovascular/pulmonary: No labored breathing, peripheral perfusion intact  Musculoskeletal:    Left knee exam range of motion 3-120.  Stable valgus and varus stress testing.  Patella tracks midline.  Neurovascular sensation is grossly intact.  Knee is grossly stable on exam.      Imaging: X-ray including 4 views left knee display stable parents total knee replacement without complication    Assessment and Plan: Status post left Mako robotic assisted total knee arthroplasty    Patient is about 10 weeks out from procedure listed above.  Overall he is doing well.  He has notable improvement of range of motion since prior evaluation.  He has returned to work tolerating well stating he does know his limits currently.  He will continue daily home exercises and work along with physical therapy.  He will follow-up in 6 weeks for likely final evaluation with Dr. Letitia Libra.      Roselyn Reef, APRN-CNP  Orthopedic Surgery    02/11/24  8:12 AM

## 2024-03-03 ENCOUNTER — Encounter

## 2024-03-03 MED ORDER — METFORMIN HCL ER 500 MG PO TB24
500 | ORAL_TABLET | Freq: Every day | ORAL | 1 refills | Status: DC
Start: 2024-03-03 — End: 2024-03-17

## 2024-03-03 NOTE — Telephone Encounter (Signed)
 Name of Medication(s) Requested:  Requested Prescriptions     Pending Prescriptions Disp Refills    metFORMIN (GLUCOPHAGE-XR) 500 MG extended release tablet 90 tablet 1     Sig: Take 1 tablet by mouth daily       Medication is on current medication list Yes    Dosage and directions were verified? Yes    Quantity verified: 90 day supply     Pharmacy Verified?  Yes    Last Appointment:  12/10/2023    Future appts:  Future Appointments   Date Time Provider Department Center   03/17/2024  8:00 AM Vickey Huger, MD [facility] DE[facility]   03/25/2024  8:20 AM Rhina Brackett, MD [facility]        (If no appt send self scheduling link. Marland KitchenREFILLAPPT)  Scheduling request sent?     []  Yes  [x]  No    Does patient need updated?  []  Yes  [x]  No

## 2024-03-11 ENCOUNTER — Encounter: Payer: PRIVATE HEALTH INSURANCE | Attending: Family Medicine | Primary: Family Medicine

## 2024-03-13 ENCOUNTER — Encounter

## 2024-03-17 ENCOUNTER — Ambulatory Visit
Admit: 2024-03-17 | Discharge: 2024-03-17 | Payer: PRIVATE HEALTH INSURANCE | Attending: Family Medicine | Primary: Family Medicine

## 2024-03-17 VITALS — BP 139/84 | HR 62 | Temp 96.60000°F | Resp 17 | Ht 75.0 in | Wt 138.6 lb

## 2024-03-17 DIAGNOSIS — E119 Type 2 diabetes mellitus without complications: Secondary | ICD-10-CM

## 2024-03-17 LAB — POCT GLYCOSYLATED HEMOGLOBIN (HGB A1C): Hemoglobin A1C: 6.1 %

## 2024-03-17 MED ORDER — VITAMIN D (ERGOCALCIFEROL) 1.25 MG (50000 UT) PO CAPS
1.25 | ORAL_CAPSULE | ORAL | 4 refills | Status: AC
Start: 2024-03-17 — End: ?

## 2024-03-17 NOTE — Patient Instructions (Signed)
 For the knee     Therawerk - magnesium     Arnica gel     Capsaicin gel

## 2024-03-17 NOTE — Progress Notes (Signed)
 Thomas Walton. (DOB:  1971-07-16) is a 53 y.o. male, established patient follow up , here for evaluation of the following:  Diabetes and Hypertension      Assessment & Plan   ASSESSMENT/PLAN      1. Type 2 diabetes mellitus without complication, without long-term current use of insulin (HCC)  Chronic, now much better control, A1c 6.6, has lost weight, after knee replacement noted decreased appetite, feels it is sustainable, plans to start exercising now that he is feeling better, microalbumin sent, he would like to discontinue medication, will stop the metformin, keep the Jardiance due to heart and kidney protection, will get [facility]    Declined foot exam today    -     POCT glycosylated hemoglobin (Hb A1C)  -     Albumin/Creatinine Ratio, Urine  2. Paroxysmal atrial fibrillation (HCC)  Chronic, well controlled, continue current medications and treatment plan, beta-blocker plus Xarelto  3. Chronic anticoagulation  Chronic, no bleeding, understands risks  4. Primary hypertension  Chronic, well controlled, continue current medications and treatment plan, metoprolol plus losartan  5. Cardiovascular risk factor  He is on statin plus Xarelto   6. OSA (obstructive sleep apnea)  Chronic, he notes compliance  7. Thyroid nodule  Status post excision, following with ENT, notes doing well  8. Vitamin D deficiency  Chronic, not well-controlled, will start new medication per below  -     vitamin D (ERGOCALCIFEROL) 1.25 MG (50000 UT) CAPS capsule; Take 1 capsule by mouth once a week Takes every Monday, Disp-10 capsule, R-4Normal  9. Screening for colon cancer  -     Cologuard (Fecal DNA Colorectal Cancer Screening)  10. Status post left knee replacement  11. History of total right knee replacement  12. Chronic pain of left knee    Return in about 6 months (around 09/17/2024) for Diabetes follow up.         Subjective   SUBJECTIVE/OBJECTIVE:  HPI    Here for diabetes follow-up    Last A1c 7.8, due for  foot and eye exams as well as microalbumin, he is on metformin plus Jardiance. A1C is 6.1 and he has decreased appetite. He notes decreased appetite which he doesn't mind. He notes he eats smaller portions, wants to stop metformin     Due for colon cancer screening    Had knee replaced about 4 months ago    Wife had tested positive for syphilis, have been sent in medication for him to take himself and will need to get blood test to check for cure    Last seen here in December about 3 months ago, was overall doing well, back on Xarelto, he was back on his healthy nutrition plan and had been losing weight. Some days feels good but does have limits now. He notes can't get too many steps or he has pain. Taking aleve 3 a day.     Has MRI for left arm/shoulder scheduled, may have torn bicep, saw Dr Letitia Libra       Declined preventative screening identified as care gaps unless ordered through this visit    PHQ2/PHQ9  PHQ-2 Score: (Patient-Rptd) 0  PHQ-2 Over the past 2 weeks, how often have you been bothered by any of the following problems?  Little interest or pleasure in doing things: (Patient-Rptd) Not at all  Feeling down, depressed, or hopeless: (Patient-Rptd) Not at all  PHQ-2 Score: (Patient-Rptd) 0   PHQ-9 Total Score: (Patient-Rptd) 0 (  03/16/2024  5:08 PM)       Past Medical History:  has a past medical history of Anticoagulant long-term use, Arthritis, Asthma, Atrial fibrillation (HCC), CAD (coronary artery disease), Congenital heart disease, Diabetes (HCC), HLD (hyperlipidemia), HTN (hypertension), Obesity, OSA on CPAP, PAF (paroxysmal atrial fibrillation) (HCC), Prolonged emergence from general anesthesia, Thyroid nodule, and Type 2 diabetes mellitus without complication (HCC).   Past Surgical History:  has a past surgical history that includes Cardiac catheterization (12/12/2020); Abdomen surgery (Right, 05/22/2022); Thyroidectomy (Right, 08/15/2023); and Total knee arthroplasty (Left, 11/26/2023).  Social  History:  reports that he has never smoked. He has never used smokeless tobacco. He reports that he does not currently use alcohol. He reports that he does not use drugs.  Family History: family history includes Heart Attack in his father.  Allergies: Shellfish-derived products  Medications:   Current Outpatient Medications   Medication Sig Dispense Refill    vitamin D (ERGOCALCIFEROL) 1.25 MG (50000 UT) CAPS capsule Take 1 capsule by mouth once a week Takes every Monday 10 capsule 4    losartan (COZAAR) 50 MG tablet Take 1 tablet by mouth daily 90 tablet 3    XARELTO 20 MG TABS tablet Take 1 tablet by mouth daily 90 tablet 3    empagliflozin (JARDIANCE) 25 MG tablet Take 1 tablet by mouth daily 90 tablet 3    albuterol sulfate HFA (VENTOLIN HFA) 108 (90 Base) MCG/ACT inhaler Inhale 2 puffs into the lungs every 6 hours as needed for Wheezing      metoprolol succinate (TOPROL XL) 50 MG extended release tablet Take 1 tablet by mouth in the morning and at bedtime 180 tablet 3    atorvastatin (LIPITOR) 40 MG tablet Take 1 tablet by mouth at bedtime 90 tablet 3     No current facility-administered medications for this visit.      Allergies: Shellfish-derived products     Review of Systems   Constitutional:  Negative for activity change, appetite change, fatigue, fever and unexpected weight change.   HENT:  Negative for trouble swallowing.    Eyes:  Negative for visual disturbance.   Respiratory:  Negative for shortness of breath.    Cardiovascular:  Negative for chest pain.   Gastrointestinal:  Negative for abdominal pain.   Musculoskeletal:  Negative for arthralgias.   Neurological:  Negative for weakness, light-headedness and headaches.   Psychiatric/Behavioral:  Negative for confusion and sleep disturbance.    All other systems reviewed and are negative.         Objective   BP 139/84   Pulse 62   Temp (!) 96.6 F (35.9 C) (Temporal)   Resp 17   Ht 1.905 m (6\' 3" )   Wt 62.9 kg (138 lb 9.6 oz)   SpO2 100%   BMI  17.32 kg/m       Lab Results   Component Value Date    LABA1C 6.1 03/17/2024    LABA1C 7.8 (H) 11/19/2023    LABA1C 7.3 06/05/2023     Lab Results   Component Value Date    CHOL 148 03/14/2023    CHOL 220 (H) 12/06/2020     Lab Results   Component Value Date    TRIG 154 (H) 03/14/2023    TRIG 334 (H) 12/06/2020     Lab Results   Component Value Date    HDL 34 (L) 03/14/2023    HDL 33 12/06/2020     No components found for: "LDLCHOLESTEROL", "LDLCALC"  Lab  Results   Component Value Date    VLDL 31 03/14/2023    VLDL 67 12/06/2020     No results found for: "CHOLHDLRATIO"   Creatinine   Date Value Ref Range Status   11/28/2023 1.2 0.70 - 1.20 mg/dL Final   09/81/1914 1.1 0.70 - 1.20 mg/dL Final   78/29/5621 1.0 0.70 - 1.20 mg/dL Final       The 30-QMVH ASCVD risk score (Arnett DK, et al., 2019) is: 19%    Values used to calculate the score:      Age: 48 years      Sex: Male      Is Non-Hispanic African American: Yes      Diabetic: Yes      Tobacco smoker: No      Systolic Blood Pressure: 139 mmHg      Is BP treated: Yes      HDL Cholesterol: 34 mg/dL      Total Cholesterol: 148 mg/dL     Physical Exam  Constitutional:       General: He is not in acute distress.     Appearance: Normal appearance.   HENT:      Head: Normocephalic and atraumatic.      Right Ear: External ear normal.      Left Ear: External ear normal.      Nose: Nose normal.      Mouth/Throat:      Mouth: Mucous membranes are moist.   Eyes:      Extraocular Movements: Extraocular movements intact.      Conjunctiva/sclera: Conjunctivae normal.   Cardiovascular:      Rate and Rhythm: Normal rate and regular rhythm.      Heart sounds: No murmur heard.  Pulmonary:      Effort: Pulmonary effort is normal.      Breath sounds: Normal breath sounds. No wheezing.   Musculoskeletal:         General: Normal range of motion.   Neurological:      General: No focal deficit present.      Mental Status: He is alert.   Psychiatric:         Mood and Affect: Mood normal.          Behavior: Behavior normal.             An electronic signature was used to authenticate this note.    --Vickey Huger, MD       *NOTE: This report was transcribed using voice recognition software. Every effort was made to ensure accuracy; however, inadvertent computerized transcription errors may be present.

## 2024-03-18 LAB — ALBUMIN/CREATININE RATIO, URINE
Albumin Urine: 202 mg/L — ABNORMAL HIGH (ref 0–19)
Creatinine, Ur: 158.4 mg/dL (ref 40.0–278.0)
Microalb/Crt. Ratio: 127 ug/mg{creat} — ABNORMAL HIGH (ref 0.0–30.0)

## 2024-03-25 ENCOUNTER — Ambulatory Visit
Admit: 2024-03-25 | Discharge: 2024-03-25 | Payer: PRIVATE HEALTH INSURANCE | Attending: Orthopaedic Surgery | Primary: Family Medicine

## 2024-03-25 NOTE — Progress Notes (Signed)
 [facility]   ORTHOPAEDIC SURGERY   DATE OF VISIT: 03/25/24  Follow Up Post Operative Visit     CHIEF COMPLAINT:   Chief Complaint   Patient presents with    Follow Up After Procedure     Pt is here today 4 months out from a mako robot assisted left total knee arthroplasty. Overall doing well.        Surgery: Left Mako robotic assisted total knee arthroplasty  Date: 11/26/2023       Subjective:    Thomas Walton. is here for follow up visit s/p above procedure.  He is doing well.  He has been making improvements over the last few weeks and has minimal pain.  He is happy with his progress    Controlled Substances Monitoring:        Physical Exam:    Height: 1.905 m (6\' 3" ), Weight - Scale: 124.7 kg (275 lb), BP: (!) 160/84    General: Alert and oriented x3, no acute distress  Cardiovascular/pulmonary: No labored breathing, peripheral perfusion intact  Musculoskeletal:      Exam of the knee shows healed incision.  No effusion.  Range of motion about 3 to 110 degrees.  The knee is stable      Imaging:   X-rays previous visit reviewed showing well aligned total knee arthroplasty        Assessment and Plan: He is about 4 months out from left total knee arthroplasty, doing well    He will continue to progress with strengthening and range of motion.  At this point we can see him back as needed              Dyanne Iha, MD  Orthopaedic Surgery   03/25/24    The patient (or guardian, if applicable) and other individuals in attendance with the patient were advised that Artificial Intelligence will be utilized during this visit to record, process the conversation to generate a clinical note, and support improvement of the AI technology. The patient (or guardian, if applicable) and other individuals in attendance at the appointment consented to the use of AI, including the recording.

## 2024-04-06 ENCOUNTER — Inpatient Hospital Stay: Admit: 2024-04-06 | Payer: PRIVATE HEALTH INSURANCE | Primary: Family Medicine

## 2024-04-06 DIAGNOSIS — M25512 Pain in left shoulder: Secondary | ICD-10-CM

## 2024-04-08 NOTE — Other (Signed)
 Discussed MRI findings with patient today.  Please reach out to the patient to schedule a follow-up appointment for MRI review to discuss treatment options with Dr. Letitia Libra.

## 2024-04-10 NOTE — Other (Signed)
 Next Appt  With Orthopedic Surgery Mick Alamin, MD)  04/22/2024 at 8:30 AM

## 2024-04-22 ENCOUNTER — Encounter: Payer: PRIVATE HEALTH INSURANCE | Attending: Orthopaedic Surgery | Primary: Family Medicine

## 2024-04-22 DIAGNOSIS — M778 Other enthesopathies, not elsewhere classified: Secondary | ICD-10-CM

## 2024-04-28 ENCOUNTER — Ambulatory Visit: Admit: 2024-04-28 | Discharge: 2024-04-28 | Payer: PRIVATE HEALTH INSURANCE | Primary: Family Medicine

## 2024-04-28 VITALS — BP 142/84 | HR 74 | Resp 18 | Ht 75.0 in | Wt 280.0 lb

## 2024-04-28 DIAGNOSIS — I480 Paroxysmal atrial fibrillation: Secondary | ICD-10-CM

## 2024-04-28 MED ORDER — METOPROLOL SUCCINATE ER 50 MG PO TB24
50 | ORAL_TABLET | Freq: Two times a day (BID) | ORAL | 3 refills | 63.50000 days | Status: AC
Start: 2024-04-28 — End: ?

## 2024-04-28 NOTE — Progress Notes (Signed)
OFFICE VISIT    NAME: Thomas Walton.     DATE OF BIRTH: 10-May-1971   AGE: 53 y.o.   MEDICAL RECORD NUMBER: [id number]       CONSULTATION INFORMATION:   DATE OF CLINIC CONSULTATION: 04/28/2024   PRINCIPLE DIAGNOSIS / reason for visit: Annual follow up    CARE TEAM MEMBERS    PCP : Nidia Barrows, MD     PRIMARY CARDIOLOGIST: Former Dr. Antoinette Batman   REFERRING PROVIDER: No ref. provider found     HISTORY OF PRESENT ILLNESS:   Thomas Walton. is a 53 y.o. male who presents forAnnual follow up. Past medical history of paroxysmal atrial fibrillation (on Xarelto ), DM2, asthma, obesity, shellfish allergy (anaphylaxis), normal coronary angiogram 11/2020        -TTE 09/28/2022:  Summary   Left ventricular internal dimensions were normal in diastole and systole.   Mild left ventricular concentric hypertrophy noted.   No regional wall motion abnormalities seen.   Normal left ventricular ejection fraction.    -ECG 04/12/2023: Normal sinus rhythm, LVH, LAFB, nonspecific ST-T wave changes.      Patient denies having any chest pain, shortness of breath, dyspnea on exertion, lower extremity edema, palpitations or dizziness.  No LOC.  He is able to do his daily life activities without problem.        PAST HISTORY:   Past Medical History:   Diagnosis Date    Anticoagulant long-term use 12/06/20    Arthritis     Left knee    Asthma     pt states he hasnt used inhaler for 2 years    Atrial fibrillation (HCC)     CAD (coronary artery disease)     Congenital heart disease 12/06/20    Diabetes (HCC)     HLD (hyperlipidemia)     HTN (hypertension)     Obesity     OSA on CPAP     PAF (paroxysmal atrial fibrillation) (HCC)     Prolonged emergence from general anesthesia     Thyroid nodule     removed, noncancerous    Type 2 diabetes mellitus without complication (HCC) 12/06/20    Diet controlled       Past Surgical History:   Procedure Laterality Date    ABDOMEN SURGERY Right 05/22/2022    EXCISION RIGHT CHEST WALL CYST performed by Michaelene Admire, MD at [facility] OR    CARDIAC CATHETERIZATION  12/12/2020    THYROIDECTOMY Right 08/15/2023    HEMITHYROIDECTOMY performed by Jennett Model, DO at [facility] OR    TOTAL KNEE ARTHROPLASTY Left 11/26/2023    ROBOTIC MAKO ASSISTED LEFT KNEE TOTAL ARTHROPLASTY performed by Mick Alamin, MD at [facility] OR      Family History   Problem Relation Age of Onset    Heart Attack Father       Social History     Socioeconomic History    Marital status: Life Partner     Spouse name: Not on file    Number of children: Not on file    Years of education: Not on file    Highest education level: Not on file   Occupational History    Not on file   Tobacco Use    Smoking status: Never    Smokeless tobacco: Never   Vaping Use    Vaping status: Never Used   Substance and Sexual Activity    Alcohol use: Not Currently     Comment:  rare    Drug use: Never    Sexual activity: Yes     Partners: Female   Other Topics Concern    Not on file   Social History Narrative    Drinks 1 cup of coffee/week     Social Drivers of Health     Financial Resource Strain: Low Risk  (12/07/2023)    Overall Financial Resource Strain (CARDIA)     Difficulty of Paying Living Expenses: Not very hard   Food Insecurity: No Food Insecurity (03/16/2024)    Hunger Vital Sign     Worried About Running Out of Food in the Last Year: Never true     Ran Out of Food in the Last Year: Never true   Transportation Needs: No Transportation Needs (03/16/2024)    PRAPARE - Therapist, art (Medical): No     Lack of Transportation (Non-Medical): No   Physical Activity: Unknown (10/07/2023)    Exercise Vital Sign     Days of Exercise per Week: 5 days     Minutes of Exercise per Session: Not on file   Stress: Stress Concern Present (04/11/2021)    [employer] of Occupational Health - Occupational Stress Questionnaire     Feeling of Stress : Very much   Social Connections: Moderately Integrated (04/11/2021)    Social Connection and Isolation Panel [NHANES]      Frequency of Communication with Friends and Family: More than three times a week     Frequency of Social Gatherings with Friends and Family: More than three times a week     Attends Religious Services: Never     Database administrator or Organizations: Yes     Attends Engineer, structural: More than 4 times per year     Marital Status: Married   Catering manager Violence: Not At Risk (07/21/2021)    Humiliation, Afraid, Rape, and Kick questionnaire     Fear of Current or Ex-Partner: No     Emotionally Abused: No     Physically Abused: No     Sexually Abused: No   Housing Stability: Low Risk  (03/16/2024)    Housing Stability Vital Sign     Unable to Pay for Housing in the Last Year: No     Number of Times Moved in the Last Year: 0     Homeless in the Last Year: No      Allergies   Allergen Reactions    Shellfish-Derived Products Anaphylaxis     Crab, lobster, shrimp, etc.           Current Outpatient Medications:     metoprolol  succinate (TOPROL  XL) 50 MG extended release tablet, Take 1 tablet by mouth in the morning and at bedtime, Disp: 180 tablet, Rfl: 3    vitamin D  (ERGOCALCIFEROL ) 1.25 MG (50000 UT) CAPS capsule, Take 1 capsule by mouth once a week Takes every Monday, Disp: 10 capsule, Rfl: 4    losartan  (COZAAR ) 50 MG tablet, Take 1 tablet by mouth daily, Disp: 90 tablet, Rfl: 3    XARELTO  20 MG TABS tablet, Take 1 tablet by mouth daily, Disp: 90 tablet, Rfl: 3    empagliflozin  (JARDIANCE ) 25 MG tablet, Take 1 tablet by mouth daily, Disp: 90 tablet, Rfl: 3    atorvastatin  (LIPITOR ) 40 MG tablet, Take 1 tablet by mouth at bedtime, Disp: 90 tablet, Rfl: 3    albuterol  sulfate HFA (VENTOLIN  HFA) 108 (90 Base) MCG/ACT inhaler,  Inhale 2 puffs into the lungs every 6 hours as needed for Wheezing (Patient not taking: Reported on 04/28/2024), Disp: , Rfl:       REVIEW OF SYSTEMS:   Review of Systems   Constitutional:  Negative for chills, fatigue and fever.   HENT:  Negative for nosebleeds.    Respiratory:   Negative for cough, chest tightness and shortness of breath.    Cardiovascular:  Negative for chest pain, palpitations and leg swelling.   Gastrointestinal:  Negative for abdominal pain, blood in stool, nausea and vomiting.   Genitourinary:  Negative for hematuria.   Musculoskeletal:  Negative for back pain and myalgias.   Skin: Negative.    Neurological:  Negative for dizziness, syncope, speech difficulty, weakness and light-headedness.   Hematological:  Does not bruise/bleed easily.   Psychiatric/Behavioral: Negative.            PHYSICAL EXAMINATION:   VITAL SIGNS: BP (!) 142/84   Pulse 74   Resp 18   Ht 1.905 m (6\' 3" )   Wt 127 kg (280 lb)   BMI 35.00 kg/m     GENERAL: NAD   HEAD: Normocephalic, atraumatic.   NECK: No JVD. No carotid bruits. No neck masses.    CARDIOVASCULAR: Normal rate, regular rhythm, normal S1, S2, no MRG    LUNGS: Clear to auscultation bilaterally, no wheezing.    ABDOMEN: Abdomen is soft and not distended. No tenderness.    EXTREMITIES: There is no pedal edema.   MUSCULOSKELETAL: No joint swelling. Normal range of motion    SKIN: Skin is moist. No ulcers or lesions.   NEUROLOGICAL: No evidence of obvious neurological deficits.    PSYCHOLOGICAL: Patient is in normal mood.          LABORATORY DATA:   Lab Results   Component Value Date/Time    WBC 8.2 11/28/2023 10:59 AM    HGB 11.3 11/28/2023 10:59 AM    HCT 35.0 11/28/2023 10:59 AM    PLT 207 11/28/2023 10:59 AM      Lab Results   Component Value Date/Time    NA 136 11/28/2023 10:59 AM    K 4.1 11/28/2023 10:59 AM    K 4.5 12/15/2021 08:34 PM    CO2 28 11/28/2023 10:59 AM    BUN 13 11/28/2023 10:59 AM    CREATININE 1.2 11/28/2023 10:59 AM    MG 2.0 12/05/2020 09:45 PM      No components found for: "HGBA1C"   Lab Results   Component Value Date/Time    TSH 1.35 10/11/2023 09:42 AM      No components found for: "LDLCALC"        The 10-year ASCVD risk score (Arnett DK, et al., 2019) is: 19.7%        ASSESSMENT & PLAN:   53 y.o. male who  presents forAnnual follow up. Past medical history of paroxysmal atrial fibrillation (on Xarelto ), DM2, asthma, obesity, shellfish allergy (anaphylaxis), normal coronary angiogram 11/2020    Paroxysmal A.fib:  Continue Xarelto   Continue Metoprolol  XL 50 mg daily  HLD:  Continue Atorvastatin  40 mg daily  DM:  On Metformin   Continue Losartan  50 mg daily    RTC in 1 year      Alejandro Amour, MD  Interventional cardiology / structural heart disease    [facility]  [address] [place] [address]  [phone]                                                              [phone]

## 2024-04-28 NOTE — Telephone Encounter (Signed)
 Name of Medication(s) Requested:  Requested Prescriptions     Pending Prescriptions Disp Refills    metoprolol  succinate (TOPROL  XL) 50 MG extended release tablet 180 tablet 3     Sig: Take 1 tablet by mouth in the morning and at bedtime       Medication is on current medication list Yes    Dosage and directions were verified? Yes    Quantity verified: 90 day supply     Pharmacy Verified?  Yes    Last Appointment:  03/17/2024    Future appts:  Future Appointments   Date Time Provider Department Center   04/28/2024  3:15 PM Alejandro Amour, MD [facility]   05/06/2024  3:30 PM Mick Alamin, MD [facility]   09/17/2024  7:40 AM Nidia Barrows, MD [facility] DE[facility]        (If no appt send self scheduling link. Aaron AasREFILLAPPT)  Scheduling request sent?     []  Yes  [x]  No    Does patient need updated?  []  Yes  [x]  No

## 2024-05-03 ENCOUNTER — Encounter

## 2024-05-04 MED ORDER — ATORVASTATIN CALCIUM 40 MG PO TABS
40 | ORAL_TABLET | Freq: Every evening | ORAL | 3 refills | 90.00000 days | Status: AC
Start: 2024-05-04 — End: ?

## 2024-05-04 NOTE — Telephone Encounter (Signed)
 1. Cardiovascular risk factor  -     atorvastatin  (LIPITOR ) 40 MG tablet; Take 1 tablet by mouth at bedtime, Disp-90 tablet, R-3Normal

## 2024-05-04 NOTE — Telephone Encounter (Signed)
 Name of Medication(s) Requested:  Requested Prescriptions     Pending Prescriptions Disp Refills    atorvastatin  (LIPITOR ) 40 MG tablet 90 tablet 3     Sig: Take 1 tablet by mouth at bedtime     Refused Prescriptions Disp Refills    losartan  (COZAAR ) 50 MG tablet 90 tablet 3     Sig: Take 1 tablet by mouth daily       Medication is on current medication list Yes    Dosage and directions were verified? Yes    Quantity verified: 90 day supply     Pharmacy Verified?  Yes    Last Appointment:  03/17/2024    Future appts:  Future Appointments   Date Time Provider Department Center   05/06/2024  3:30 PM Mick Alamin, MD [facility]   09/17/2024  7:40 AM Nidia Barrows, MD [facility] DEP        (If no appt send self scheduling link. Aaron AasREFILLAPPT)  Scheduling request sent?     []  Yes  [x]  No    Does patient need updated?  []  Yes  [x]  No

## 2024-05-06 ENCOUNTER — Ambulatory Visit
Admit: 2024-05-06 | Discharge: 2024-05-06 | Payer: PRIVATE HEALTH INSURANCE | Attending: Orthopaedic Surgery | Primary: Family Medicine

## 2024-05-06 VITALS — BP 164/99 | HR 71 | Temp 97.30000°F | Resp 20 | Ht 75.0 in | Wt 272.0 lb

## 2024-05-06 DIAGNOSIS — M19019 Primary osteoarthritis, unspecified shoulder: Secondary | ICD-10-CM

## 2024-05-06 NOTE — Progress Notes (Signed)
 [facility]   ORTHOPAEDIC SURGERY AND SPORTS MEDICINE  DATE OF VISIT: 05/07/24  Follow Up Shoulder Visit     CHIEF COMPLAINT:   Chief Complaint   Patient presents with    Follow-up     Left shoulder pain    Results     Left shoulder MRI    Pain     5 / 10        HPI:    Thomas Walton is a 53 y.o. year old male who presented to the office today for follow up of left shoulder pain.  Patient was last seen and evaluated in the office for shoulder pain in October 2024.  Recently had an MRI of the left shoulder completed is following up today to review findings and discuss treatment options.  Reports symptoms remain unchanged.        REVIEW OF SYSTEMS:     General: Negative Fever, chills, fatigue   Cardiovascular: Negative chest pain, palpitations  Respiratory: Equal chest expansion, negative shortness of breath   Skin: Negative rash, open wounds  Psych: Normal affect, mood stable  Neurologic: sensation grossly intact in extremities   Musculoskeletal: See HPI      Physical Exam:     Height: 1.905 m (6\' 3" ), Weight - Scale: 123.4 kg (272 lb), BP: (!) 164/99 (states tired, pain, lack of water )    General: Alert and oriented x3, no acute distress  Cardiovascular/pulmonary: No labored breathing, peripheral perfusion intact  Musculoskeletal:    Left Shoulder:    negative visualized deformity or swelling.  Range of motion is 170/80/t10 forward elevation/external rotation/internal rotation    negative tenderness on palpation     Cuff testing:  Jobe test (supraspinatus): Positive for mild pain  Speeds test (biceps): Positive for pain  O'Brien's test (labral): Positive for pain  Belly press test and lift off test (subscapularis): Intact        Controlled Substances Monitoring:      Imaging:  MRI of the left shoulder reviewed showing evidence of rotator cuff tendinosis, fluid tracking around the long head biceps tendon      Assessment: Left shoulder pain      Plan:   Patient presents to the office today for evaluation of left  shoulder pain.  History, provider note, physical exam and imaging were reviewed.  Reports that he still has persistent achy pain with daily activities.  Treatment options discussed with patient in the office today including activity modification, oral/topical anti-inflammatories, physical therapy/home exercises, injection options were discussed.  Patient opted to proceed with home exercises.  He is provided the shoulder conditioning handout he will perform independently.  He will follow-up in 6 to 8 weeks for reevaluation.  Will consider injection at that time should symptoms fail to improve.      Delice Felt, APRN-CNP  Orthopaedic Surgery   05/07/24  1:16 PM

## 2024-05-15 ENCOUNTER — Ambulatory Visit (INDEPENDENT_AMBULATORY_CARE_PROVIDER_SITE_OTHER): Payer: Self-pay | Admitting: Family Medicine

## 2024-05-15 ENCOUNTER — Encounter: Payer: Self-pay | Admitting: Family Medicine

## 2024-05-15 VITALS — BP 130/76 | HR 83 | Temp 98.3°F | Ht 74.0 in | Wt 233.6 lb

## 2024-05-15 DIAGNOSIS — Z13220 Encounter for screening for lipoid disorders: Secondary | ICD-10-CM

## 2024-05-15 DIAGNOSIS — Z125 Encounter for screening for malignant neoplasm of prostate: Secondary | ICD-10-CM

## 2024-05-15 DIAGNOSIS — M5412 Radiculopathy, cervical region: Secondary | ICD-10-CM

## 2024-05-15 DIAGNOSIS — Z1211 Encounter for screening for malignant neoplasm of colon: Secondary | ICD-10-CM

## 2024-05-15 MED ORDER — MELOXICAM 15 MG PO TABS: 15.0000 mg | ORAL_TABLET | Freq: Every day | ORAL | 0 refills | Status: AC

## 2024-05-15 MED ORDER — VALACYCLOVIR HCL 1 G PO TABS: 1000.0000 mg | ORAL_TABLET | Freq: Three times a day (TID) | ORAL | 0 refills | Status: AC

## 2024-05-15 MED ORDER — ALPRAZOLAM 0.5 MG PO TABS: 0.5000 mg | ORAL_TABLET | Freq: Three times a day (TID) | ORAL | 2 refills | Status: AC | PRN

## 2024-05-15 NOTE — Progress Notes (Signed)
 Subjective:    Patient ID: Aaron Williams, male    DOB: 06/03/1971, 53 y.o.   MRN: [id number]  2 days ago, the patient developed pain in his right ear and also down his right side of his throat.  He states he felt like the pain was on the inside of his throat from the angle of his mandible all the way down to his cricoid cartilage.  He denies any trouble swallowing.  Around the same time he developed a burning hyperesthesia on the crown of his head.  The symptoms lasted for 2 days however today they are better.  The crown was hit on note medical lesions discussed shingles.  The pain in his ear is better.  His tympanic membrane and auditory canal are completely normal on exam.  The pain in his throat is better.  There is no erythema or exudate in the posterior oropharynx.  There are no vesicles in his throat.  There is no evidence of herpangina.  The patient has no lymphadenopathy in the neck.  Patient is overdue for a physical exam No past medical history on file.  No current outpatient medications on file prior to visit.   No current facility-administered medications on file prior to visit.   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 Denies any alcohol tobacco or illicit drugs. Family History  Problem Relation Age of Onset   Diabetes Mother    Heart disease Father    Parkinson's disease Maternal Grandmother    Heart disease Paternal Grandfather       Review of Systems  All other systems reviewed and are negative.      Objective:   Physical Exam Vitals reviewed.  Constitutional:      General: He is not in acute distress.    Appearance: He is well-developed. He is not diaphoretic.  HENT:     Right Ear: Tympanic membrane and ear canal normal.     Left Ear: Tympanic membrane and ear canal normal.     Nose: No congestion or rhinorrhea.     Mouth/Throat:     Pharynx: No oropharyngeal exudate or posterior oropharyngeal erythema.  Neck:      Vascular: No carotid bruit.  Cardiovascular:     Rate and Rhythm: Normal rate and regular rhythm.     Pulses: Normal pulses.     Heart sounds: Normal heart sounds. No murmur heard.    No friction rub. No gallop.  Pulmonary:     Effort: No respiratory distress.     Breath sounds: Normal breath sounds. No wheezing or rales.  Lymphadenopathy:     Cervical: No cervical adenopathy.  Skin:    Findings: No rash.  Neurological:     Mental Status: He is alert and oriented to person, place, and time.     Cranial Nerves: No cranial nerve deficit.     Sensory: No sensory deficit.     Coordination: Coordination normal.  Psychiatric:        Behavior: Behavior normal.        Thought Content: Thought content normal.        Judgment: Judgment normal.           Assessment & Plan:  Cervical radiculopathy - Plan: DG Cervical Spine Complete  Colon cancer screening - Plan: Cologuard  Prostate cancer screening - Plan: PSA  Screening cholesterol level - Plan: CBC with Differential/Platelet, Comprehensive metabolic panel with GFR, Lipid panel  I believe  the patient's ear pain, throat pain, and scalp hyperesthesia is likely neuropathic in nature.  I am concerned that he may be developing some type of cervical radiculopathy.  I see no evidence of shingles today on exam but I would monitor for this.  I recommended meloxicam 15 mg a day for 1 week to try to treat the pain.  If pain persist I would proceed with imaging of the cervical spine.  Schedule the patient for Cologuard.  Return fasting for CBC a CMP a lipid panel and a PSA.

## 2024-06-03 LAB — COLOGUARD: COLOGUARD: NEGATIVE

## 2024-06-04 ENCOUNTER — Ambulatory Visit: Payer: Self-pay | Admitting: Family Medicine

## 2024-06-17 ENCOUNTER — Ambulatory Visit
Admit: 2024-06-17 | Discharge: 2024-06-17 | Payer: PRIVATE HEALTH INSURANCE | Attending: Orthopaedic Surgery | Primary: Family Medicine

## 2024-06-17 VITALS — BP 148/92 | HR 65 | Temp 96.80000°F | Resp 20 | Ht 75.0 in | Wt 272.0 lb

## 2024-06-17 DIAGNOSIS — M25512 Pain in left shoulder: Secondary | ICD-10-CM

## 2024-06-17 NOTE — Progress Notes (Signed)
 [facility]   ORTHOPAEDIC SURGERY AND SPORTS MEDICINE  DATE OF VISIT: 06/17/24  Follow Up Shoulder Visit     CHIEF COMPLAINT:   Chief Complaint   Patient presents with    Shoulder Pain     Pt presents today f/u for his Lt shoulder. He states he has occasional sharp pain and is doing home exercises x3 a week. He does not want to proceed  w/ injections. Rates his pain a 2 on a 0-10 pain scale.       HPI:    Thomas Walton is a 53 y.o. year old male who presented to the office today for follow up of left shoulder pain, previously evaluated on 05/06/2024. Previous treatment has included active modification, home exercises.  He reports minimal pain with daily activities.  Would like to hold off on further treatment at this time.      REVIEW OF SYSTEMS:     General: Negative Fever, chills, fatigue   Cardiovascular: Negative chest pain, palpitations  Respiratory: Equal chest expansion, negative shortness of breath   Skin: Negative rash, open wounds  Psych: Normal affect, mood stable  Neurologic: sensation grossly intact in extremities   Musculoskeletal: See HPI      Physical Exam:     Height: 1.905 m (6' 3), Weight - Scale: 123.4 kg (272 lb), BP: (!) 148/92    General: Alert and oriented x3, no acute distress  Cardiovascular/pulmonary: No labored breathing, peripheral perfusion intact  Musculoskeletal:    Left Shoulder:    negative visualized deformity or swelling.  Range of motion is 180 /80/lumbar region forward elevation/external rotation/internal rotation    negative tenderness on palpation     Cuff testing:  Jobe test (supraspinatus): Intact  Speeds test (biceps): Positive for pain  O'Brien's test (labral): Positive for pain  Belly press test and lift off test (subscapularis): Intact        Controlled Substances Monitoring:      Imaging:  MRI reviewed showing some abnormality within the biceps labral complex, upper subscapularis.    Assessment: Left shoulder pain, biceps subluxation, probable SLAP tear, rotator cuff  tendinosis      Plan:   Patient presents to the office today for evaluation of left shoulder pain.  History, provider note, physical exam and imaging were reviewed.  We reviewed MRI findings once again today.  Treatment options discussed with patient in the office today including activity modification, oral/topical anti-inflammatories, physical therapy/home exercises, injection options.  At this time he reports mild pain with daily activities.  Displays overall full range of motion and intact rotator cuff strength on exam.  He does not wish to proceed with further treatment including injections at this time.  He can resume normal activities as tolerated.  Will call to schedule follow-up appointment as needed.      Delice Felt, APRN-CNP  Orthopaedic Surgery   06/17/24  3:33 PM

## 2024-09-17 ENCOUNTER — Encounter: Payer: PRIVATE HEALTH INSURANCE | Attending: Family Medicine | Primary: Family Medicine

## 2024-10-07 ENCOUNTER — Encounter

## 2024-10-08 ENCOUNTER — Ambulatory Visit: Admit: 2024-10-08 | Discharge: 2024-10-08 | Payer: PRIVATE HEALTH INSURANCE | Primary: Family Medicine

## 2024-10-08 VITALS — BP 144/89 | HR 71 | Resp 18 | Ht 75.0 in | Wt 293.4 lb

## 2024-10-08 DIAGNOSIS — G4733 Obstructive sleep apnea (adult) (pediatric): Principal | ICD-10-CM

## 2024-10-08 NOTE — Progress Notes (Signed)
"      INTERIM HISTORY    Patient of Dr. Landa Alm Louder was last seen virtually 09/19/23 for OSA (CPAP at 14 cm).    He comes the office today for annual follow-up.  Compliance was not available for review.  He denies night arousals, gasping, apnea, snoring or chest pain/palpitations.  He awakens refreshed and denies need for daytime napping.  Admits to being consistent with 7 hours of use nightly.    ALLERGIES  Allergies   Allergen Reactions    Shellfish-Derived Products Anaphylaxis     Crab, lobster, shrimp, etc.       PAST MEDICAL HISTORY   has a past medical history of Anticoagulant long-term use, Arthritis, Asthma, Atrial fibrillation (HCC), CAD (coronary artery disease), Congenital heart disease, Diabetes (HCC), HLD (hyperlipidemia), HTN (hypertension), Obesity, OSA on CPAP, PAF (paroxysmal atrial fibrillation) (HCC), Prolonged emergence from general anesthesia, Thyroid nodule, and Type 2 diabetes mellitus without complication.     MEDICATIONS      has a current medication list which includes the following prescription(s): atorvastatin , metoprolol  succinate, vitamin d , losartan , xarelto , empagliflozin , and albuterol  sulfate hfa.    IMMUNIZATIONS  Immunization status: Not reviewed this visit.    PAST SURGICAL HISTORY   has a past surgical history that includes Cardiac catheterization (12/12/2020); Abdomen surgery (Right, 05/22/2022); Thyroidectomy (Right, 08/15/2023); and Total knee arthroplasty (Left, 11/26/2023).    FAMILY HISTORY  family history includes Heart Attack in his father.    SOCIAL HISTORY   reports that he has never smoked. He has never used smokeless tobacco. He reports that he does not currently use alcohol. He reports that he does not use drugs.      OBJECTIVE  BP (!) 144/89 (BP Site: Right Upper Arm, Patient Position: Sitting)   Pulse 71   Resp 18   Ht 1.905 m (6' 3)   Wt 133.1 kg (293 lb 6.4 oz)   SpO2 97%   BMI 36.67 kg/m  Body mass index is 36.67 kg/m.     EXAM:  GENERAL: Alert  and oriented x3 in no acute distress, obese.  HEENT: Atraumatic.    NECK: Supple. No JVD.    CARDIOVASCULAR: Regular rate and rhythm. No murmur  LUNGS: Clear to auscultation bilaterally. No wheezing.  ABDOMEN: Soft, nontender. No distention BS+  EXTREMITIES: No clubbing, No edema   PSYCH: calm and pleasant       DATA/IMAGING:     CTA chest 11/2020  No evidence of pulmonary embolism or acute pulmonary abnormality    DIAGNOSIS   Diagnosis Orders   1. OSA on CPAP        2. Obesity with sleep apnea            PLAN/IMPRESSION    OSA with CPAP  Continue CPAP at 14 cm  Compliance report unavailable for review  DME is [facility], will contact them to ascertain connection issue  Good use and benefit per his accounts  Return in 3 months for compliance    Obesity with sleep apnea  Weight loss was reviewed and encouraged  No interest in Zepbound use    FOLLOW UP  Return in about 3 months (around 01/08/2025).    Electronically signed by Charlies Romp, APRN - CNS,10/08/2024 3:28 PM  "

## 2024-10-09 ENCOUNTER — Ambulatory Visit
Admit: 2024-10-09 | Discharge: 2024-10-09 | Payer: PRIVATE HEALTH INSURANCE | Attending: Family Medicine | Primary: Family Medicine

## 2024-10-09 VITALS — BP 147/89 | HR 73 | Temp 97.00000°F | Resp 17 | Ht 75.0 in | Wt 290.0 lb

## 2024-10-09 DIAGNOSIS — E119 Type 2 diabetes mellitus without complications: Principal | ICD-10-CM

## 2024-10-09 LAB — POCT GLYCOSYLATED HEMOGLOBIN (HGB A1C): Hemoglobin A1C: 6.9 %

## 2024-10-09 MED ORDER — LOSARTAN POTASSIUM 100 MG PO TABS
100 | ORAL_TABLET | Freq: Every day | ORAL | 1 refills | 90.00000 days | Status: AC
Start: 2024-10-09 — End: 2025-04-07

## 2024-10-09 NOTE — Progress Notes (Signed)
 "Patient:  Thomas Walton. 53 y.o. male     10/09/24  Chiefcomplaint:   Chief Complaint   Patient presents with    Dizziness     Happened 1 day     Hypertension    Diabetes     History of Present Illness  The patient is a 53 year old male who presents for a routine evaluation of diabetes, hypertension, and atrial fibrillation.    He was scheduled for a routine appointment with Dr. Larnell in September 2025, but it was canceled and rescheduled with another provider. He has chronic conditions including diabetes, hypertension, and atrial fibrillation, all of which are generally well-managed. His A1c level was recorded as 6.1 in 02/2024. He is currently on Jardiance  for his diabetes, which he tolerates well and recently refilled for a 90-day supply.    He does not monitor his blood pressure at home and attributes today's slightly elevated reading to a stressful work week. He recalls his last reading being around 120 systolic but is unsure of the diastolic value. He has never been on additional blood pressure medication and prefers not to be, but is open to it if necessary. He reports normal kidney function. He experienced weight loss of about 30 pounds following knee surgery but has since regained the weight plus an additional 10 pounds. He experiences knee discomfort when sitting for extended periods, which eases upon movement. He is on losartan  for his hypertension.    He does not experience chronic atrial fibrillation. Prior to his diagnosis, he was a regular gym-goer and believes he may have overexerted himself without adequate hydration. He underwent a stress test due to symptoms of fatigue, dizziness, and an elevated heart rate during workouts. Since starting medication, he has had one episode of atrial fibrillation, which he believes was triggered by a stressful incident at work. He continues to follow up with cardiology and monitors his heart rate using a watch.    He uses a CPAP machine for sleep  apnea.    Occupation: Solicitor  Diet: Varies from day to day    PAST SURGICAL HISTORY:  Knee surgery (Thanksgiving week, 2024)    FAMILY HISTORY  He reports no family history of atrial fibrillation.    Results  Labs   - A1c: 02/2024, 6.1   - A1c: 6.9    Lab Results   Component Value Date    NA 136 11/28/2023    K 4.1 11/28/2023    CL 100 11/28/2023    CO2 28 11/28/2023    BUN 13 11/28/2023    CREATININE 1.2 11/28/2023    GLUCOSE 118 (H) 11/28/2023    CALCIUM  9.0 11/28/2023    BILITOT 0.5 03/14/2023    ALKPHOS 70 03/14/2023    AST 22 03/14/2023    ALT 30 03/14/2023    LABGLOM 71 11/28/2023    GFRAA >60 04/30/2021     Hemoglobin A1C   Date Value Ref Range Status   10/09/2024 6.9 % Final     Lab Results   Component Value Date    CHOL 148 03/14/2023    TRIG 154 (H) 03/14/2023    HDL 34 (L) 03/14/2023    LDL 83 03/14/2023    VLDL 31 03/14/2023     Lab Results   Component Value Date    WBC 8.2 11/28/2023    HGB 11.3 (L) 11/28/2023    HCT 35.0 (L) 11/28/2023    MCV 87.9 11/28/2023    PLT 207 11/28/2023  Lab Results   Component Value Date    TSH 1.35 10/11/2023     Vitals:    10/09/24 0826 10/09/24 0829   BP: (!) 148/94 (!) 147/89   Pulse: 73    Resp: 17    Temp: 97 F (36.1 C)    TempSrc: Temporal    SpO2: 97%    Weight: 131.5 kg (290 lb)    Height: 1.905 m (6' 3)       Physical Exam   Vitals: BP (!) 147/89   Pulse 73   Temp 97 F (36.1 C) (Temporal)   Resp 17   Ht 1.905 m (6' 3)   Wt 131.5 kg (290 lb)   SpO2 97%   BMI 36.25 kg/m   General Appearance: Alert, oriented, no acute distress  HEENT: No scleral icterus. No visible discharge from eyes  Neck: Not rigid. No visible masses  Chest wall/Lung: Clear to auscultation bilaterally,  respirations unlabored. No ronchi/wheezing/rales  Heart: RRR, no murmur  Abdomen: Soft, nontender  Extremities:  No edema  Skin: No rashes. No jaundice  Neuro: Alert and oriented        Psych: Appropriate mood and appropriate affect  Type 2 diabetes mellitus without  complication, without long-term current use of insulin  (HCC)  -     POCT glycosylated hemoglobin (Hb A1C)  -     CBC with Auto Differential; Future  -     Lipid Panel; Future  -     Comprehensive Metabolic Panel; Future  Primary hypertension  -     losartan  (COZAAR ) 100 MG tablet; Take 1 tablet by mouth daily, Disp-90 tablet, [id number] refills remain on this prescription. Your patient is requesting advance approval of refills for this medication to PREVENT ANY MISSED DOSESNormal  OSA (obstructive sleep apnea)  Paroxysmal atrial fibrillation (HCC)  Chronic anticoagulation    Assessment & Plan  1. Diabetes mellitus:  - His A1c level is currently at 6.9, up from 6.1.   - He is advised to maintain a balanced diet and regular exercise regimen to help manage his blood sugar levels. He notes significant weight gain over the last year. Recommendation includes weight loss for better management of his diabetes.   - He is currently taking Jardiance  and tolerating it well. No new medications or changes to his current regimen are necessary at this time.    2. Hypertension:  - His blood pressure readings have consistently been elevated, averaging in the range of 140s/90s throughout the year.  - The recent weight gain could be contributing to this increase in blood pressure. He is advised to monitor his blood pressure regularly at home and maintain a healthy lifestyle, including a balanced diet and regular exercise.  - The dosage of losartan  will be increased from 50 mg to 100 mg daily. A follow-up nursing visit for a blood pressure check is recommended in 2 weeks. Additionally, lab tests should be conducted approximately 2 weeks after the medication increase to monitor kidney function and potassium levels.    3. Atrial fibrillation:  - He reports no recent episodes of atrial fibrillation except for one incident triggered by stress.  - His heart rate and rhythm are currently normal.  - He is advised to continue monitoring his heart  rate and follow up with cardiology as needed.    Follow-up: A follow-up nursing visit for a blood pressure check is recommended in 2 weeks.          The patient (or  guardian, if applicable) and other individuals in attendance with the patient were advised that Artificial Intelligence will be utilized during this visit to record, process the conversation to generate a clinical note, and support improvement of the AI technology. The patient (or guardian, if applicable) and other individuals in attendance at the appointment consented to the use of AI, including the recording.        Garnette Paschal, DO             "

## 2024-10-23 ENCOUNTER — Encounter: Payer: PRIVATE HEALTH INSURANCE | Primary: Family Medicine

## 2024-11-27 ENCOUNTER — Encounter

## 2024-11-27 MED ORDER — XARELTO 20 MG PO TABS
20 | ORAL_TABLET | Freq: Every day | ORAL | 3 refills | 30.00000 days | Status: AC
Start: 2024-11-27 — End: ?

## 2024-11-27 MED ORDER — JARDIANCE 25 MG PO TABS
25 | ORAL_TABLET | Freq: Every day | ORAL | 3 refills | 30.00000 days | Status: AC
Start: 2024-11-27 — End: ?

## 2024-11-27 NOTE — Telephone Encounter (Signed)
"  Name of Medication(s) Requested:  Requested Prescriptions     Pending Prescriptions Disp Refills    XARELTO  20 MG TABS tablet [Pharmacy Med Name: XARELTO  20MG  TABLETS] 90 tablet 3     Sig: TAKE 1 TABLET BY MOUTH DAILY    JARDIANCE  25 MG tablet [Pharmacy Med Name: JARDIANCE  25MG  TABLETS] 90 tablet 3     Sig: TAKE 1 TABLET BY MOUTH DAILY       Medication is on current medication list Yes    Dosage and directions were verified? Yes    Quantity verified: 90 day supply     Pharmacy Verified?  Yes    Last Appointment:  03/17/2024    Future appts:  Future Appointments   Date Time Provider Department Center   02/10/2025  7:40 AM Keturah Jansky, MD [facility]        (If no appt send self scheduling link. [email or web address])  Scheduling request sent?     []  Yes  [x]  No    Does patient need updated?  []  Yes  [x]  No  "

## 2024-12-15 NOTE — Progress Notes (Signed)
"  Patient's HM shows they are overdue for Colorectal Screening.   Care Everywhere and [employer] Manager files searched.  No results to attach to order nor HM updated.       "

## 2025-01-21 ENCOUNTER — Encounter
Payer: PRIVATE HEALTH INSURANCE | Attending: Student in an Organized Health Care Education/Training Program | Primary: Family Medicine

## 2025-02-10 ENCOUNTER — Encounter: Payer: PRIVATE HEALTH INSURANCE | Attending: Family Medicine | Primary: Family Medicine
